# Patient Record
Sex: Female | Born: 1935 | Race: White | Hispanic: No | Marital: Married | Smoking: Former smoker
Health system: Southern US, Community
[De-identification: ages and names within clinical notes are randomized; demographics above are authoritative.]

## PROBLEM LIST (undated history)

## (undated) DIAGNOSIS — I890 Lymphedema, not elsewhere classified: Secondary | ICD-10-CM

## (undated) DIAGNOSIS — Z8719 Personal history of other diseases of the digestive system: Secondary | ICD-10-CM

## (undated) DIAGNOSIS — M1990 Unspecified osteoarthritis, unspecified site: Secondary | ICD-10-CM

## (undated) DIAGNOSIS — J40 Bronchitis, not specified as acute or chronic: Secondary | ICD-10-CM

## (undated) DIAGNOSIS — I10 Essential (primary) hypertension: Secondary | ICD-10-CM

## (undated) DIAGNOSIS — IMO0002 Reserved for concepts with insufficient information to code with codable children: Secondary | ICD-10-CM

## (undated) DIAGNOSIS — Z22322 Carrier or suspected carrier of Methicillin resistant Staphylococcus aureus: Secondary | ICD-10-CM

## (undated) DIAGNOSIS — K219 Gastro-esophageal reflux disease without esophagitis: Secondary | ICD-10-CM

## (undated) DIAGNOSIS — H547 Unspecified visual loss: Secondary | ICD-10-CM

## (undated) DIAGNOSIS — D649 Anemia, unspecified: Secondary | ICD-10-CM

## (undated) DIAGNOSIS — I509 Heart failure, unspecified: Secondary | ICD-10-CM

## (undated) HISTORY — PX: OTHER SURGICAL HISTORY: SHX169

## (undated) HISTORY — PX: KNEE ARTHROSCOPY: SUR90

## (undated) HISTORY — PX: BREAST ENHANCEMENT SURGERY: SHX7

## (undated) HISTORY — DX: Essential (primary) hypertension: I10

---

## 2003-06-18 ENCOUNTER — Other Ambulatory Visit: Payer: Self-pay

## 2003-11-08 ENCOUNTER — Encounter: Admission: RE | Admit: 2003-11-08 | Discharge: 2003-11-08 | Payer: Self-pay | Admitting: Internal Medicine

## 2003-11-19 ENCOUNTER — Encounter: Admission: RE | Admit: 2003-11-19 | Discharge: 2003-11-19 | Payer: Self-pay | Admitting: Internal Medicine

## 2003-12-05 ENCOUNTER — Encounter: Admission: RE | Admit: 2003-12-05 | Discharge: 2003-12-05 | Payer: Self-pay | Admitting: Internal Medicine

## 2004-01-16 ENCOUNTER — Ambulatory Visit: Payer: Self-pay | Admitting: Oncology

## 2004-01-21 ENCOUNTER — Encounter: Payer: Self-pay | Admitting: Internal Medicine

## 2004-02-11 ENCOUNTER — Ambulatory Visit: Payer: Self-pay | Admitting: Oncology

## 2004-02-11 ENCOUNTER — Encounter: Payer: Self-pay | Admitting: Internal Medicine

## 2004-05-06 ENCOUNTER — Ambulatory Visit: Payer: Self-pay | Admitting: Oncology

## 2004-05-13 ENCOUNTER — Ambulatory Visit: Payer: Self-pay | Admitting: Oncology

## 2004-07-21 ENCOUNTER — Ambulatory Visit: Payer: Self-pay | Admitting: Internal Medicine

## 2011-07-19 ENCOUNTER — Ambulatory Visit: Payer: Self-pay | Admitting: Internal Medicine

## 2011-08-01 ENCOUNTER — Other Ambulatory Visit: Payer: Self-pay | Admitting: Orthopedic Surgery

## 2011-08-01 MED ORDER — DEXAMETHASONE SODIUM PHOSPHATE 10 MG/ML IJ SOLN: 10.0000 mg | Freq: Once | INTRAMUSCULAR | Status: DC

## 2011-08-01 MED ORDER — BUPIVACAINE 0.25 % ON-Q PUMP SINGLE CATH 300ML: 300.0000 mL | INJECTION | Status: DC

## 2011-10-01 ENCOUNTER — Encounter (HOSPITAL_COMMUNITY): Payer: Self-pay | Admitting: Pharmacy Technician

## 2011-10-05 ENCOUNTER — Encounter (HOSPITAL_COMMUNITY)
Admission: RE | Admit: 2011-10-05 | Discharge: 2011-10-05 | Disposition: A | Discharge status: Home / Self Care | Payer: Medicare Other | Source: Ambulatory Visit | Attending: Orthopedic Surgery | Admitting: Orthopedic Surgery

## 2011-10-05 ENCOUNTER — Other Ambulatory Visit: Payer: Self-pay | Admitting: Orthopedic Surgery

## 2011-10-05 ENCOUNTER — Ambulatory Visit (HOSPITAL_COMMUNITY)
Admission: RE | Admit: 2011-10-05 | Discharge: 2011-10-05 | Disposition: A | Discharge status: Home / Self Care | Payer: Medicare Other | Source: Ambulatory Visit | Attending: Orthopedic Surgery | Admitting: Orthopedic Surgery

## 2011-10-05 ENCOUNTER — Encounter (HOSPITAL_COMMUNITY): Payer: Self-pay

## 2011-10-05 DIAGNOSIS — M1710 Unilateral primary osteoarthritis, unspecified knee: Secondary | ICD-10-CM | POA: Insufficient documentation

## 2011-10-05 DIAGNOSIS — M795 Residual foreign body in soft tissue: Secondary | ICD-10-CM | POA: Insufficient documentation

## 2011-10-05 DIAGNOSIS — Z01812 Encounter for preprocedural laboratory examination: Secondary | ICD-10-CM | POA: Insufficient documentation

## 2011-10-05 DIAGNOSIS — Z01818 Encounter for other preprocedural examination: Secondary | ICD-10-CM | POA: Insufficient documentation

## 2011-10-05 DIAGNOSIS — K449 Diaphragmatic hernia without obstruction or gangrene: Secondary | ICD-10-CM | POA: Insufficient documentation

## 2011-10-05 HISTORY — DX: Unspecified visual loss: H54.7

## 2011-10-05 HISTORY — DX: Unspecified osteoarthritis, unspecified site: M19.90

## 2011-10-05 HISTORY — DX: Personal history of other diseases of the digestive system: Z87.19

## 2011-10-05 HISTORY — DX: Gastro-esophageal reflux disease without esophagitis: K21.9

## 2011-10-05 HISTORY — DX: Carrier or suspected carrier of methicillin resistant Staphylococcus aureus: Z22.322

## 2011-10-05 HISTORY — DX: Reserved for concepts with insufficient information to code with codable children: IMO0002

## 2011-10-05 HISTORY — DX: Anemia, unspecified: D64.9

## 2011-10-05 LAB — CBC
HCT: 32.4 % — ABNORMAL LOW (ref 36.0–46.0)
Hemoglobin: 10.1 g/dL — ABNORMAL LOW (ref 12.0–15.0)
MCH: 31.2 pg (ref 26.0–34.0)
MCHC: 31.2 g/dL (ref 30.0–36.0)
RBC: 3.24 MIL/uL — ABNORMAL LOW (ref 3.87–5.11)

## 2011-10-05 LAB — URINALYSIS, ROUTINE W REFLEX MICROSCOPIC
Glucose, UA: NEGATIVE mg/dL
Hgb urine dipstick: NEGATIVE
Nitrite: NEGATIVE
Protein, ur: NEGATIVE mg/dL
Specific Gravity, Urine: 1.034 — ABNORMAL HIGH (ref 1.005–1.030)
Urobilinogen, UA: 1 mg/dL (ref 0.0–1.0)
pH: 5.5 (ref 5.0–8.0)

## 2011-10-05 LAB — COMPREHENSIVE METABOLIC PANEL WITH GFR
ALT: 8 U/L (ref 0–35)
AST: 11 U/L (ref 0–37)
Albumin: 3.8 g/dL (ref 3.5–5.2)
Alkaline Phosphatase: 67 U/L (ref 39–117)
BUN: 21 mg/dL (ref 6–23)
CO2: 28 meq/L (ref 19–32)
Calcium: 9.9 mg/dL (ref 8.4–10.5)
Chloride: 103 meq/L (ref 96–112)
Creatinine, Ser: 0.78 mg/dL (ref 0.50–1.10)
GFR calc Af Amer: 88 mL/min — ABNORMAL LOW
GFR calc non Af Amer: 76 mL/min — ABNORMAL LOW
Glucose, Bld: 109 mg/dL — ABNORMAL HIGH (ref 70–99)
Potassium: 4.3 meq/L (ref 3.5–5.1)
Sodium: 139 meq/L (ref 135–145)
Total Bilirubin: 0.5 mg/dL (ref 0.3–1.2)
Total Protein: 7.2 g/dL (ref 6.0–8.3)

## 2011-10-05 LAB — URINE MICROSCOPIC-ADD ON

## 2011-10-05 LAB — APTT: aPTT: 33 s (ref 24–37)

## 2011-10-05 LAB — PROTIME-INR: Prothrombin Time: 12.9 seconds (ref 11.6–15.2)

## 2011-10-05 NOTE — Pre-Procedure Instructions (Signed)
Teach Back Method used for teaching on preop appointment.   

## 2011-10-05 NOTE — Progress Notes (Signed)
Patient has copy of recent labs done 09/29/11 at PCP office in [place].  HGB of 9.7.  Pt has noted wheezing with exertion on preop visit.  EKG and CXR completed at preop visit. Patient reports talking diuretic weekly for edema.

## 2011-10-05 NOTE — Pre-Procedure Instructions (Signed)
10/05/11 patient reports history of 2 epidurals on lower back

## 2011-10-05 NOTE — Progress Notes (Signed)
10/05/11 Faxed and confirmation received to Dr Lequita Halt to note abnormal  CBC and Urinalysis results.

## 2011-10-05 NOTE — Patient Instructions (Addendum)
20 Jaime [name]  10/05/2011   Your procedure is scheduled on:  10/18/11 1115am-1220pm  Report to [facility] at 0845 AM.  Call this number if you have problems the morning of surgery: [phone]   Remember:   Do not eat food:After Midnight.  May have clear liquids:until Midnight . Marland Kitchen  Take these medicines the morning of surgery with A SIP OF WATER:   Do not wear jewelry, make-up or nail polish.  Do not wear lotions, powders, or perfumes.   Do not shave 48 hours prior to surgery.   Do not bring valuables to the hospital.  Contacts, dentures or bridgework may not be worn into surgery.  Leave suitcase in the car. After surgery it may be brought to your room.  For patients admitted to the hospital, checkout time is 11:00 AM the day of discharge.       Special Instructions: CHG Shower Use Special Wash: 1/2 bottle night before surgery and 1/2 bottle morning of surgery. shower chin to toes with CHG.  Wash face and private parts with regular soap.     Please read over the following fact sheets that you were given: MRSA Information, Blood Transfusion Fact Sheet, Incentive Spirometry Fact sheet, coughing and deep breathing exercises, leg exercises

## 2011-10-06 ENCOUNTER — Encounter (HOSPITAL_COMMUNITY): Payer: Self-pay

## 2011-10-06 NOTE — Pre-Procedure Instructions (Signed)
10/06/11 Received fax from Avel Peace stating no action necessary on CBC and Urinalysis results.

## 2011-10-06 NOTE — Pre-Procedure Instructions (Signed)
Office visit note from PCP on chart

## 2011-10-08 NOTE — Pre-Procedure Instructions (Signed)
10/07/11 Patient called and stated had received phone call regarding positive pcr screen.  Voiced understanding for following Mupirocin ointment instructions.

## 2011-10-17 ENCOUNTER — Other Ambulatory Visit: Payer: Self-pay | Admitting: Orthopedic Surgery

## 2011-10-17 NOTE — H&P (Signed)
PATIENT IS BLIND Please announce yourself by name and purpose when entering the room.  Patient also uses a seeing-eye dog for mobility. Please do not pet the dog as they are working.  Jaime [name]  DOB: 06/24/1927 Married / Language: English / Race: White / Female  Date of Admission:  10/18/2011  Chief Complaint:  Right Knee Pain  History of Present Illness The patient is a 78 year old female who comes in for a preoperative History and Physical. The patient is scheduled for a right total knee arthroplasty to be performed by Dr. Frank V. Aluisio, MD at [facility] on 10/18/2011. The patient is a 78 year old female who presents with knee complaints. The patient reports left knee (worse than right) symptoms including: pain, swelling, instability, giving way and stiffness .The patient feels that the symptoms are worsening. The patient has the current diagnosis of knee osteoarthritis. Prior to being seen today the patient was previously evaluated in this clinic 5 year(s) ago. Previous work-up for this problem has included knee x-rays. Current treatment includes application of ice and nonsteroidal anti-inflammatory drugs (ibuprofen). Her right knee has gotten progressively worse. The left knee has a lot of chronic problems. It hurts at all times but she has had significant comorbidities with that left knee. She had falls many years ago with open injury which developed to MRSA and a gram negative infection. It eventually healed after some grafting. She said that both knees, however, are hurting equally. The left one is more chronic but the right one has been more acute. She has had injections. She is at a stage now where she feels she needs to get it fixed because she's concerned about falling. Of note, she is legally blind and walks with the assistance of a seeing eye dog. She is really concerned that the knee is going to give out on her and cause her to fall and hurt other  things. They have been treated conservatively in the past for the above stated problem and despite conservative measures, they continue to have progressive pain and severe functional limitations and dysfunction. They have failed non-operative management including home exercise, medications, and injections. It is felt that they would benefit from undergoing total joint replacement. Risks and benefits of the procedure have been discussed with the patient and they elect to proceed with surgery. There are no active contraindications to surgery such as ongoing infection or rapidly progressive neurological disease.  Please note that the patient had a recent compression fracture back before Easter earlier this year. The pain has been improving over time and she states that she has had no pain this past week. She has been managed and treated by Dr. Miller in Pain Management at [facility].   Problem List/Past Medical Osteoarthrosis NOS, lower leg (715.96). 10/11/2006 Gastroesophageal Reflux Disease Osteoarthritis Hiatal Hernia Measles Mumps  Allergies No Known Drug Allergies   Family History Cancer. father Rheumatoid Arthritis. sister   Social History Living situation. live with spouse Marital status. married Illicit drug use. no Drug/Alcohol Rehab (Previously). no Exercise. Exercises weekly; does other Tobacco use. former smoker; smoke(d) 1 pack(s) per day Tobacco / smoke exposure. no Number of flights of stairs before winded. less than 1 Pain Contract. no Drug/Alcohol Rehab (Currently). no Children. 2 Current work status. retired Alcohol use. current drinker; drinks wine; only occasionally per week Post-Surgical Plans. Plan is to go home but she is open to SNF/rehab if needed. Advance Directives. Healthcare POA   Medication   History Omeprazole (20MG Tablet DR, Oral) Active. Torsemide (20MG Tablet, Oral) Active. (once a week) Potassium Chloride Crys ER  (20MEQ Tablet ER, Oral) Active. Ferrex 28 ( Oral) Active. Ibuprofen (200MG Capsule, Oral) Active. Magnesium Oxide (400MG Capsule, Oral) Active. Vitamin D3 (1000UNIT Capsule, Oral) Active.   Past Surgical History Arthroscopy of Knee. left Tubal Ligation   Review of Systems General:Not Present- Chills, Fever, Night Sweats, Fatigue, Weight Gain, Weight Loss and Memory Loss. Skin:Not Present- Hives, Itching, Rash, Eczema and Lesions. HEENT:Not Present- Tinnitus, Headache, Double Vision, Visual Loss, Hearing Loss and Dentures. Respiratory:Not Present- Shortness of breath with exertion, Shortness of breath at rest, Allergies, Coughing up blood and Chronic Cough. Cardiovascular:Not Present- Chest Pain, Racing/skipping heartbeats, Difficulty Breathing Lying Down, Murmur, Swelling and Palpitations. Gastrointestinal:Not Present- Bloody Stool, Heartburn, Abdominal Pain, Vomiting, Nausea, Constipation, Diarrhea, Difficulty Swallowing, Jaundice and Loss of appetitie. Female Genitourinary:Not Present- Blood in Urine, Urinary frequency, Weak urinary stream, Discharge, Flank Pain, Incontinence, Painful Urination, Urgency, Urinary Retention and Urinating at Night. Musculoskeletal:Not Present- Muscle Weakness, Muscle Pain, Joint Swelling, Joint Pain, Back Pain, Morning Stiffness and Spasms. Neurological:Not Present- Tremor, Dizziness, Blackout spells, Paralysis, Difficulty with balance and Weakness. Psychiatric:Not Present- Insomnia.   Vitals Weight: 183 lb Height: 61.5 in Body Surface Area: 1.9 m Body Mass Index: 34.02 kg/m Pulse: 80 (Regular) Resp.: 14 (Unlabored) BP: 126/78 (Sitting, Left Arm, Standard)    Physical Exam The physical exam findings are as follows: The patient is an 78 year old female.  She is blind and is accompanied by her seeing-eye dog.  General Mental Status - Alert, cooperative and good historian. General Appearance- pleasant. Not in acute  distress. Orientation- Oriented X3. Build & Nutrition- Well nourished and Well developed.   Head and Neck Head- normocephalic, atraumatic . Neck Global Assessment- supple. no bruit auscultated on the right and no bruit auscultated on the left.   Eye Motion- Bilateral- EOMI. The patient is blind.   Chest and Lung Exam Auscultation: Breath sounds:- clear at anterior chest wall and - clear at posterior chest wall. Adventitious sounds:- No Adventitious sounds.   Cardiovascular Auscultation:Rhythm- Regular rate and rhythm. Heart Sounds- S1 WNL and S2 WNL. Murmurs & Other Heart Sounds:Auscultation of the heart reveals - No Murmurs.   Abdomen Palpation/Percussion:Tenderness- Abdomen is non-tender to palpation. Rigidity (guarding)- Abdomen is soft. Auscultation:Auscultation of the abdomen reveals - Bowel sounds normal.   Female Genitourinary Not done, not pertinent to present illness  Musculoskeletal On exam, she's alert and oriented, in no apparent distress. Left knee shows significant skin deformity. She's got a very contracted scar below the knee. This area is visible with healed skin graft. She is very tender throughout the knee. The skin is very thin overlying the anteromedial knee. Her right knee shows no effusion. Range is 5-120 with marked crepitus on ROM, slight varus deformity, tender medial greater than lateral with no instability. Pulses, sensation and motor are intact.  RADIOGRAPHS: AP of both knees and lateral show advanced arthritic changes, tricompartmental in nature in the left knee, medial and patellofemoral on the right. Left knee changes are more advanced.  Assessment & Plan Osteoarthritis Right Knee  Note: Patient is for a Right Total Knee Replacement by Dr. Aluisio.  PCP - Dr. Mark Miller [facility]  Signed electronically by DREW L Prisma Decarlo, PA-C   PATIENT IS BLIND Please announce yourself by name and purpose when  entering the room.  Patient also uses a seeing-eye dog for mobility. Please do not pet the dog as they   are working. 

## 2011-10-18 ENCOUNTER — Inpatient Hospital Stay (HOSPITAL_COMMUNITY)
Admission: RE | Admit: 2011-10-18 | Discharge: 2011-10-23 | DRG: 470 | Disposition: A | Discharge status: Skilled Nursing Facility | Payer: Medicare Other | Source: Ambulatory Visit | Attending: Orthopedic Surgery | Admitting: Orthopedic Surgery

## 2011-10-18 ENCOUNTER — Encounter (HOSPITAL_COMMUNITY): Payer: Self-pay | Admitting: Anesthesiology

## 2011-10-18 ENCOUNTER — Encounter (HOSPITAL_COMMUNITY): Payer: Self-pay | Admitting: Orthopedic Surgery

## 2011-10-18 ENCOUNTER — Ambulatory Visit (HOSPITAL_COMMUNITY): Payer: Medicare Other | Admitting: Anesthesiology

## 2011-10-18 ENCOUNTER — Encounter (HOSPITAL_COMMUNITY)
Admission: RE | Disposition: A | Discharge status: Skilled Nursing Facility | Payer: Self-pay | Source: Ambulatory Visit | Attending: Orthopedic Surgery

## 2011-10-18 ENCOUNTER — Encounter (HOSPITAL_COMMUNITY): Payer: Self-pay | Admitting: *Deleted

## 2011-10-18 DIAGNOSIS — H543 Unqualified visual loss, both eyes: Secondary | ICD-10-CM | POA: Diagnosis present

## 2011-10-18 DIAGNOSIS — M1710 Unilateral primary osteoarthritis, unspecified knee: Principal | ICD-10-CM | POA: Diagnosis present

## 2011-10-18 DIAGNOSIS — Z9289 Personal history of other medical treatment: Secondary | ICD-10-CM

## 2011-10-18 DIAGNOSIS — D62 Acute posthemorrhagic anemia: Secondary | ICD-10-CM | POA: Diagnosis not present

## 2011-10-18 DIAGNOSIS — D649 Anemia, unspecified: Secondary | ICD-10-CM

## 2011-10-18 DIAGNOSIS — E871 Hypo-osmolality and hyponatremia: Secondary | ICD-10-CM | POA: Diagnosis not present

## 2011-10-18 DIAGNOSIS — R0902 Hypoxemia: Secondary | ICD-10-CM | POA: Diagnosis not present

## 2011-10-18 DIAGNOSIS — Z96659 Presence of unspecified artificial knee joint: Secondary | ICD-10-CM

## 2011-10-18 DIAGNOSIS — K449 Diaphragmatic hernia without obstruction or gangrene: Secondary | ICD-10-CM | POA: Diagnosis present

## 2011-10-18 DIAGNOSIS — K219 Gastro-esophageal reflux disease without esophagitis: Secondary | ICD-10-CM | POA: Diagnosis present

## 2011-10-18 HISTORY — PX: TOTAL KNEE ARTHROPLASTY: SHX125

## 2011-10-18 LAB — ABO/RH: ABO/RH(D): A NEG

## 2011-10-18 SURGERY — ARTHROPLASTY, KNEE, TOTAL
Anesthesia: Spinal | Site: Knee | Laterality: Right | Wound class: Clean

## 2011-10-18 MED ORDER — HYDROMORPHONE HCL PF 1 MG/ML IJ SOLN
0.2500 mg | INTRAMUSCULAR | Status: DC | PRN
  Administered 2011-10-18 (×2): 0.5 mg via INTRAVENOUS

## 2011-10-18 MED ORDER — BUPIVACAINE ON-Q PAIN PUMP (FOR ORDER SET NO CHG)
INJECTION | Status: DC
  Filled 2011-10-18: qty 1

## 2011-10-18 MED ORDER — RIVAROXABAN 10 MG PO TABS
10.0000 mg | ORAL_TABLET | Freq: Every day | ORAL | Status: DC
  Administered 2011-10-19 – 2011-10-23 (×5): 10 mg via ORAL
  Filled 2011-10-18 (×7): qty 1

## 2011-10-18 MED ORDER — TRAMADOL HCL 50 MG PO TABS
50.0000 mg | ORAL_TABLET | Freq: Four times a day (QID) | ORAL | Status: DC | PRN
  Administered 2011-10-19 – 2011-10-20 (×2): 100 mg via ORAL
  Administered 2011-10-20: 50 mg via ORAL
  Administered 2011-10-21 – 2011-10-23 (×8): 100 mg via ORAL
  Filled 2011-10-18 (×9): qty 2
  Filled 2011-10-18: qty 1
  Filled 2011-10-18: qty 2

## 2011-10-18 MED ORDER — POLYETHYLENE GLYCOL 3350 17 G PO PACK: 17.0000 g | PACK | Freq: Every day | ORAL | Status: DC | PRN

## 2011-10-18 MED ORDER — FLEET ENEMA 7-19 GM/118ML RE ENEM: 1.0000 | ENEMA | Freq: Once | RECTAL | Status: AC | PRN

## 2011-10-18 MED ORDER — PHENYLEPHRINE HCL 10 MG/ML IJ SOLN
10.0000 mg | INTRAVENOUS | Status: DC | PRN
  Administered 2011-10-18: 10 ug/min via INTRAVENOUS

## 2011-10-18 MED ORDER — DIPHENHYDRAMINE HCL 12.5 MG/5ML PO ELIX: 12.5000 mg | ORAL_SOLUTION | ORAL | Status: DC | PRN

## 2011-10-18 MED ORDER — PROMETHAZINE HCL 25 MG/ML IJ SOLN: 6.2500 mg | INTRAMUSCULAR | Status: DC | PRN

## 2011-10-18 MED ORDER — PHENOL 1.4 % MT LIQD: 1.0000 | OROMUCOSAL | Status: DC | PRN

## 2011-10-18 MED ORDER — CHLORHEXIDINE GLUCONATE 4 % EX LIQD
60.0000 mL | Freq: Once | CUTANEOUS | Status: DC
  Filled 2011-10-18: qty 60

## 2011-10-18 MED ORDER — ACETAMINOPHEN 10 MG/ML IV SOLN
INTRAVENOUS | Status: AC
  Filled 2011-10-18: qty 100

## 2011-10-18 MED ORDER — MORPHINE SULFATE 2 MG/ML IJ SOLN
1.0000 mg | INTRAMUSCULAR | Status: DC | PRN
  Administered 2011-10-18 – 2011-10-19 (×6): 2 mg via INTRAVENOUS
  Filled 2011-10-18 (×6): qty 1

## 2011-10-18 MED ORDER — ACETAMINOPHEN 650 MG RE SUPP: 650.0000 mg | Freq: Four times a day (QID) | RECTAL | Status: DC | PRN

## 2011-10-18 MED ORDER — METHOCARBAMOL 100 MG/ML IJ SOLN
500.0000 mg | Freq: Four times a day (QID) | INTRAVENOUS | Status: DC | PRN
  Administered 2011-10-18 – 2011-10-19 (×2): 500 mg via INTRAVENOUS
  Filled 2011-10-18 (×2): qty 5

## 2011-10-18 MED ORDER — BISACODYL 10 MG RE SUPP
10.0000 mg | Freq: Every day | RECTAL | Status: DC | PRN
  Administered 2011-10-22: 10 mg via RECTAL
  Filled 2011-10-18: qty 1

## 2011-10-18 MED ORDER — SODIUM CHLORIDE 0.9 % IR SOLN
Status: DC | PRN
  Administered 2011-10-18: 1000 mL

## 2011-10-18 MED ORDER — OXYCODONE HCL 5 MG PO TABS
5.0000 mg | ORAL_TABLET | ORAL | Status: DC | PRN
Start: 2011-10-18 — End: 2011-10-19
  Administered 2011-10-18: 5 mg via ORAL
  Administered 2011-10-19 (×2): 10 mg via ORAL
  Filled 2011-10-18 (×2): qty 2
  Filled 2011-10-18: qty 1

## 2011-10-18 MED ORDER — METHOCARBAMOL 500 MG PO TABS
500.0000 mg | ORAL_TABLET | Freq: Four times a day (QID) | ORAL | Status: DC | PRN
  Administered 2011-10-18 – 2011-10-23 (×9): 500 mg via ORAL
  Filled 2011-10-18 (×9): qty 1

## 2011-10-18 MED ORDER — METOCLOPRAMIDE HCL 5 MG/ML IJ SOLN: 5.0000 mg | Freq: Three times a day (TID) | INTRAMUSCULAR | Status: DC | PRN

## 2011-10-18 MED ORDER — DOCUSATE SODIUM 100 MG PO CAPS
100.0000 mg | ORAL_CAPSULE | Freq: Two times a day (BID) | ORAL | Status: DC
  Administered 2011-10-18 – 2011-10-23 (×9): 100 mg via ORAL

## 2011-10-18 MED ORDER — 0.9 % SODIUM CHLORIDE (POUR BTL) OPTIME
TOPICAL | Status: DC | PRN
  Administered 2011-10-18: 1000 mL

## 2011-10-18 MED ORDER — MENTHOL 3 MG MT LOZG
1.0000 | LOZENGE | OROMUCOSAL | Status: DC | PRN
  Administered 2011-10-21: 3 mg via ORAL
  Filled 2011-10-18: qty 9

## 2011-10-18 MED ORDER — FENTANYL 12 MCG/HR TD PT72
12.5000 ug | MEDICATED_PATCH | TRANSDERMAL | Status: DC
  Administered 2011-10-18 – 2011-10-21 (×2): 12.5 ug via TRANSDERMAL
  Filled 2011-10-18 (×2): qty 1

## 2011-10-18 MED ORDER — METOCLOPRAMIDE HCL 10 MG PO TABS: 5.0000 mg | ORAL_TABLET | Freq: Three times a day (TID) | ORAL | Status: DC | PRN

## 2011-10-18 MED ORDER — ONDANSETRON HCL 4 MG PO TABS: 4.0000 mg | ORAL_TABLET | Freq: Four times a day (QID) | ORAL | Status: DC | PRN

## 2011-10-18 MED ORDER — LACTATED RINGERS IV SOLN
INTRAVENOUS | Status: DC | PRN
  Administered 2011-10-18 (×3): via INTRAVENOUS

## 2011-10-18 MED ORDER — HYDROMORPHONE HCL PF 1 MG/ML IJ SOLN
0.2500 mg | INTRAMUSCULAR | Status: DC | PRN
  Administered 2011-10-18 (×4): 0.5 mg via INTRAVENOUS

## 2011-10-18 MED ORDER — ONDANSETRON HCL 4 MG/2ML IJ SOLN: 4.0000 mg | Freq: Four times a day (QID) | INTRAMUSCULAR | Status: DC | PRN

## 2011-10-18 MED ORDER — PROPOFOL 10 MG/ML IV EMUL
INTRAVENOUS | Status: DC | PRN
  Administered 2011-10-18: 50 ug/kg/min via INTRAVENOUS

## 2011-10-18 MED ORDER — SODIUM CHLORIDE 0.9 % IV SOLN: INTRAVENOUS | Status: DC

## 2011-10-18 MED ORDER — FENTANYL CITRATE 0.05 MG/ML IJ SOLN
INTRAMUSCULAR | Status: DC | PRN
  Administered 2011-10-18: 25 ug via INTRAVENOUS

## 2011-10-18 MED ORDER — BUPIVACAINE 0.25 % ON-Q PUMP SINGLE CATH 300ML
INJECTION | Status: DC | PRN
  Administered 2011-10-18: 300 mL

## 2011-10-18 MED ORDER — ONDANSETRON HCL 4 MG/2ML IJ SOLN
INTRAMUSCULAR | Status: DC | PRN
  Administered 2011-10-18: 4 mg via INTRAVENOUS

## 2011-10-18 MED ORDER — CEFAZOLIN SODIUM-DEXTROSE 2-3 GM-% IV SOLR
2.0000 g | INTRAVENOUS | Status: AC
  Administered 2011-10-18: 2 g via INTRAVENOUS

## 2011-10-18 MED ORDER — PANTOPRAZOLE SODIUM 40 MG PO TBEC
40.0000 mg | DELAYED_RELEASE_TABLET | Freq: Every day | ORAL | Status: DC
  Filled 2011-10-18: qty 1

## 2011-10-18 MED ORDER — ACETAMINOPHEN 325 MG PO TABS
650.0000 mg | ORAL_TABLET | Freq: Four times a day (QID) | ORAL | Status: DC | PRN
  Administered 2011-10-21 – 2011-10-23 (×3): 650 mg via ORAL
  Filled 2011-10-18 (×3): qty 2

## 2011-10-18 MED ORDER — TORSEMIDE 20 MG PO TABS
20.0000 mg | ORAL_TABLET | ORAL | Status: DC
  Administered 2011-10-18: 20 mg via ORAL
  Filled 2011-10-18: qty 1

## 2011-10-18 MED ORDER — ACETAMINOPHEN 10 MG/ML IV SOLN
1000.0000 mg | Freq: Once | INTRAVENOUS | Status: AC
  Administered 2011-10-18: 1000 mg via INTRAVENOUS

## 2011-10-18 MED ORDER — CEFAZOLIN SODIUM 1-5 GM-% IV SOLN
1.0000 g | Freq: Four times a day (QID) | INTRAVENOUS | Status: AC
  Administered 2011-10-18 (×2): 1 g via INTRAVENOUS
  Filled 2011-10-18 (×2): qty 50

## 2011-10-18 MED ORDER — HYDROMORPHONE HCL PF 1 MG/ML IJ SOLN
INTRAMUSCULAR | Status: AC
  Filled 2011-10-18: qty 2

## 2011-10-18 MED ORDER — HYDROMORPHONE HCL PF 1 MG/ML IJ SOLN
INTRAMUSCULAR | Status: AC
  Filled 2011-10-18: qty 1

## 2011-10-18 MED ORDER — BUPIVACAINE IN DEXTROSE 0.75-8.25 % IT SOLN
INTRATHECAL | Status: DC | PRN
  Administered 2011-10-18: 1.8 mL via INTRATHECAL

## 2011-10-18 MED ORDER — LACTATED RINGERS IV SOLN: INTRAVENOUS | Status: DC

## 2011-10-18 MED ORDER — MIDAZOLAM HCL 5 MG/5ML IJ SOLN
INTRAMUSCULAR | Status: DC | PRN
  Administered 2011-10-18 (×2): 1 mg via INTRAVENOUS

## 2011-10-18 MED ORDER — ACETAMINOPHEN 10 MG/ML IV SOLN
1000.0000 mg | Freq: Four times a day (QID) | INTRAVENOUS | Status: AC
  Administered 2011-10-18 – 2011-10-19 (×4): 1000 mg via INTRAVENOUS
  Filled 2011-10-18 (×5): qty 100

## 2011-10-18 MED ORDER — CEFAZOLIN SODIUM-DEXTROSE 2-3 GM-% IV SOLR
INTRAVENOUS | Status: AC
  Filled 2011-10-18: qty 50

## 2011-10-18 MED ORDER — BUPIVACAINE 0.25 % ON-Q PUMP SINGLE CATH 300ML
INJECTION | Status: AC
  Filled 2011-10-18: qty 300

## 2011-10-18 MED ORDER — SODIUM CHLORIDE 0.9 % IV SOLN
INTRAVENOUS | Status: DC
  Administered 2011-10-19: 08:00:00 via INTRAVENOUS

## 2011-10-18 MED ORDER — PROPOFOL 10 MG/ML IV EMUL
INTRAVENOUS | Status: DC | PRN
  Administered 2011-10-18: 20 mg via INTRAVENOUS

## 2011-10-18 SURGICAL SUPPLY — 53 items
BAG ZIPLOCK 12X15 (MISCELLANEOUS) ×2 IMPLANT
BANDAGE ELASTIC 6 VELCRO ST LF (GAUZE/BANDAGES/DRESSINGS) ×2 IMPLANT
BANDAGE ESMARK 6X9 LF (GAUZE/BANDAGES/DRESSINGS) ×1 IMPLANT
BLADE SAG 18X100X1.27 (BLADE) ×2 IMPLANT
BLADE SAW SGTL 11.0X1.19X90.0M (BLADE) ×2 IMPLANT
BNDG ESMARK 6X9 LF (GAUZE/BANDAGES/DRESSINGS) ×2
BOWL SMART MIX CTS (DISPOSABLE) ×2 IMPLANT
CATH KIT ON-Q SILVERSOAK 5IN (CATHETERS) ×2 IMPLANT
CEMENT HV SMART SET (Cement) ×4 IMPLANT
CLOTH BEACON ORANGE TIMEOUT ST (SAFETY) ×2 IMPLANT
CUFF TOURN SGL QUICK 34 (TOURNIQUET CUFF) ×1
CUFF TRNQT CYL 34X4X40X1 (TOURNIQUET CUFF) ×1 IMPLANT
DRAPE EXTREMITY T 121X128X90 (DRAPE) ×2 IMPLANT
DRAPE POUCH INSTRU U-SHP 10X18 (DRAPES) ×2 IMPLANT
DRAPE U-SHAPE 47X51 STRL (DRAPES) ×2 IMPLANT
DRSG ADAPTIC 3X8 NADH LF (GAUZE/BANDAGES/DRESSINGS) ×2 IMPLANT
DRSG EMULSION OIL 3X16 NADH (GAUZE/BANDAGES/DRESSINGS) ×2 IMPLANT
DRSG PAD ABDOMINAL 8X10 ST (GAUZE/BANDAGES/DRESSINGS) ×2 IMPLANT
DURAPREP 26ML APPLICATOR (WOUND CARE) ×2 IMPLANT
ELECT REM PT RETURN 9FT ADLT (ELECTROSURGICAL) ×2
ELECTRODE REM PT RTRN 9FT ADLT (ELECTROSURGICAL) ×1 IMPLANT
EVACUATOR 1/8 PVC DRAIN (DRAIN) ×2 IMPLANT
FACESHIELD LNG OPTICON STERILE (SAFETY) ×10 IMPLANT
GLOVE BIO SURGEON STRL SZ7.5 (GLOVE) ×2 IMPLANT
GLOVE BIO SURGEON STRL SZ8 (GLOVE) ×2 IMPLANT
GLOVE BIOGEL PI IND STRL 8 (GLOVE) ×2 IMPLANT
GLOVE BIOGEL PI INDICATOR 8 (GLOVE) ×2
GOWN STRL NON-REIN LRG LVL3 (GOWN DISPOSABLE) ×2 IMPLANT
GOWN STRL REIN XL XLG (GOWN DISPOSABLE) ×2 IMPLANT
HANDPIECE INTERPULSE COAX TIP (DISPOSABLE) ×1
IMMOBILIZER KNEE 20 (SOFTGOODS) ×2
IMMOBILIZER KNEE 20 THIGH 36 (SOFTGOODS) ×1 IMPLANT
K-WIRE 2.8 (WIRE) ×4 IMPLANT
KIT BASIN OR (CUSTOM PROCEDURE TRAY) ×2 IMPLANT
MANIFOLD NEPTUNE II (INSTRUMENTS) ×2 IMPLANT
NS IRRIG 1000ML POUR BTL (IV SOLUTION) ×2 IMPLANT
PACK TOTAL JOINT (CUSTOM PROCEDURE TRAY) ×2 IMPLANT
PAD ABD 7.5X8 STRL (GAUZE/BANDAGES/DRESSINGS) ×2 IMPLANT
PADDING CAST COTTON 6X4 STRL (CAST SUPPLIES) ×2 IMPLANT
POSITIONER SURGICAL ARM (MISCELLANEOUS) ×2 IMPLANT
SET HNDPC FAN SPRY TIP SCT (DISPOSABLE) ×1 IMPLANT
SPONGE GAUZE 4X4 12PLY (GAUZE/BANDAGES/DRESSINGS) ×2 IMPLANT
STRIP CLOSURE SKIN 1/2X4 (GAUZE/BANDAGES/DRESSINGS) ×4 IMPLANT
SUCTION FRAZIER 12FR DISP (SUCTIONS) ×2 IMPLANT
SUT MNCRL AB 4-0 PS2 18 (SUTURE) ×2 IMPLANT
SUT PDS AB 1 CT1 27 (SUTURE) ×6 IMPLANT
SUT VIC AB 2-0 CT1 27 (SUTURE) ×3
SUT VIC AB 2-0 CT1 TAPERPNT 27 (SUTURE) ×3 IMPLANT
SUT VLOC 180 0 24IN GS25 (SUTURE) ×2 IMPLANT
TOWEL OR 17X26 10 PK STRL BLUE (TOWEL DISPOSABLE) ×4 IMPLANT
TRAY FOLEY CATH 14FRSI W/METER (CATHETERS) ×2 IMPLANT
WATER STERILE IRR 1500ML POUR (IV SOLUTION) ×2 IMPLANT
WRAP KNEE MAXI GEL POST OP (GAUZE/BANDAGES/DRESSINGS) ×4 IMPLANT

## 2011-10-18 NOTE — Interval H&P Note (Signed)
History and Physical Interval Note:  10/18/2011 11:12 AM  Jaime [name]  has presented today for surgery, with the diagnosis of Osteoarthritis of the Right Knee  The various methods of treatment have been discussed with the patient and family. After consideration of risks, benefits and other options for treatment, the patient has consented to  Procedure(s) (LRB): TOTAL KNEE ARTHROPLASTY (Right) as a surgical intervention .  The patient's history has been reviewed, patient examined, no change in status, stable for surgery.  I have reviewed the patients' chart and labs.  Questions were answered to the patient's satisfaction.     Loanne Drilling

## 2011-10-18 NOTE — Transfer of Care (Signed)
Immediate Anesthesia Transfer of Care Note  Patient: Jaime [name]  Procedure(s) Performed: Procedure(s) (LRB): TOTAL KNEE ARTHROPLASTY (Right)  Patient Location: PACU  Anesthesia Type: MAC and Regional  Level of Consciousness: awake, alert , oriented and patient cooperative  Airway & Oxygen Therapy: Patient Spontanous Breathing and Patient connected to face mask oxygen  Post-op Assessment: Report given to PACU RN and Post -op Vital signs reviewed and stable  Post vital signs: Reviewed and stable  Complications: No apparent anesthesia complications

## 2011-10-18 NOTE — Op Note (Signed)
Pre-operative diagnosis- Osteoarthritis Right knee(s)  Post-operative diagnosis- Osteoarthritis  Right knee(s)  Procedure-   Right Total Knee Arthroplasty  Surgeon- Jaime Rankin. Hebe Merriwether, MD  Assistant- Avel Peace, PA-C   Anesthesia-  Spinal   EBL- * No blood loss amount entered *   Drains Hemovac   Tourniquet time - 48 minutes @ 300 mm Hg  Complications- None  Condition-PACU - hemodynamically stable.   Brief Clinical Note  Jaime [name] is a 78 y.o. year old female with end stage OA of her right knee with progressively worsening pain and dysfunction. She has constant pain, with activity and at rest and significant functional deficits with difficulties even with ADLs. She has had extensive non-op management including analgesics, injections of cortisone, and home exercise program, but remains in significant pain with significant dysfunction.Radiographs show bone on bone arthritis lateral and patellofemoral with large valgus deformity. She presents now for left Total Knee Arthroplasty.   Procedure in detail---       The patient is brought into the operating room and positioned supine on the operating table. After successful administration of Spinal anesthetic, a tourniquet is placed high on the Right thigh(s) and the lower extremity is prepped and draped in the usual sterile fashion. Time out is performed by the operating team and then the Right  lower extremity is wrapped in Esmarch, knee flexed and the tourniquet inflated to 300 mmHg.       A midline incision is made with a ten blade through the subcutaneous tissue to the level of the extensor mechanism. A fresh blade is used to make a lateral parapatellar arthrotomy due to the patients' valgus deformity. Soft tissue over the proximal lateral tibia is subperiosteally elevated to the joint line with a knife to the posterolateral corner but not including the structures of the posterolateral corner. Soft tissue over the proximal medial  tibia is elevated with attention being paid to avoiding the patellar tendon on the tibial tubercle. The patella is everted medially, knee flexed 90 degrees and the ACL and PCL are removed. Findings are bone on bone lateral and patellofemoral with large lateral osteophytes. .       The drill is used to create a starting hole in the distal femur and the canal is thoroughly irrigated with sterile saline to remove the fatty contents. The 5 degree Right  valgus alignment guide is placed into the femoral canal and the distal femoral cutting block is pinned to remove 11  mm off the distal femur. Resection is made with an oscillating saw.      The tibia is subluxed forward and the menisci are removed. The extramedullary alignment guide is placed referencing proximally at the medial aspect of the tibial tubercle and distally along the second metatarsal axis and tibial crest. The block is pinned to remove 2mm off the more deficient lateral side. Resection is made with an oscillating saw. Size 2.5  is the most appropriate size for the tibia and the proximal tibia is prepared with the modular drill and keel punch for that size.      The femoral sizing guide is placed and size 2.5  is most appropriate. Rotation is marked off the epicondylar axis and confirmed by creating a rectangular flexion gap at 90 degrees. The size 2.5  cutting block is pinned in this rotation and the anterior, posterior and chamfer cuts are made with the oscillating saw. The intercondylar block is then placed and that cut is made.  Trial size 2.5  tibial component, trial size 2.5  posterior stabilized femur and a 12.5  mm posterior stabilized rotating platform insert trial is placed. Full extension is achieved with excellent varus/valgus and   anterior/posterior balance throughout full range of motion. The patella is everted and thickness measured to be 22  mm. Free hand resection is taken to 12 mm, a 35 template is placed, lug holes are drilled,  trial patella is placed, and it tracks normally. Osteophytes are removed off the posterior femur with the trial in place. All trials are removed and the cut bone surfaces prepared with pulsatile lavage. Cement is mixed and once ready for implantation, the size 2.5  tibial implant, size 2.5 posterior stabilized femoral component, and the size 35  patella are cemented in place and the patella is held with the clamp. The trial insert is placed and the knee held in full extension. All extruded cement is removed and once the cement is hard the permanent 12.5  mm posterior stabilized rotating platform insert is placed into the tibial tray. It is noted that there is a small fracture in the medial femoral condyle which is stable throughout full range of motion. I placed 2 threaded Steinmann pins from medial to lateral and the condyle remained stable to multidirectional stresses and throughout range of motion.      The wound is copiously irrigated with saline solution and the tourniquet is released for a total  tourniquet time of 48  minutes. Bleeding is identified and controlled with electrocautery. The extensor mechanism is closed with interrupted #1 PDS leaving open a small area from the superior to inferior pole of the patella to serve as a mini lateral release. Flexion against gravity is 140  degrees and the patella tracks normally. Subcutaneous tissue is closed with 2.0 vicryl and subcuticular with running 4.0 Monocryl. The catheter for the Marcaine pain pump is placed and the pump is initiated. The incision is cleaned and dried and steri-strips and a bulky sterile dressing are applied. The limb is placed into a knee immobilizer and the patient is awakened and transported to recovery in stable condition.      Please note that a surgical assistant was a medical necessity for this procedure in order to perform it in a safe and expeditious manner. Surgical assistant was necessary to retract the ligaments and vital  neurovascular structures to prevent injury to them and also necessary for proper positioning of the limb to allow for anatomic placement of the prosthesis.    Jaime Rankin Shade Rivenbark, MD    10/18/2011, 12:48 PM

## 2011-10-18 NOTE — Preoperative (Signed)
Beta Blockers   Reason not to administer Beta Blockers:Not Applicable 

## 2011-10-18 NOTE — Anesthesia Postprocedure Evaluation (Signed)
Anesthesia Post Note  Patient: Jaime [name]  Procedure(s) Performed: Procedure(s) (LRB): TOTAL KNEE ARTHROPLASTY (Right)  Anesthesia type: Spinal  Patient location: PACU  Post pain: Pain level controlled  Post assessment: Post-op Vital signs reviewed  Last Vitals:  Filed Vitals:   10/18/11 1430  BP: 153/83  Pulse: 90  Temp:   Resp: 18    Post vital signs: Reviewed  Level of consciousness: sedated  Complications: No apparent anesthesia complications

## 2011-10-18 NOTE — Anesthesia Preprocedure Evaluation (Addendum)
Anesthesia Evaluation  Patient identified by MRN, date of birth, ID band Patient awake    Reviewed: Allergy & Precautions, H&P , NPO status , Patient's Chart, lab work & pertinent test results  Airway Mallampati: II TM Distance: <3 FB Neck ROM: Full    Dental  (+) Teeth Intact and Dental Advisory Given   Pulmonary neg pulmonary ROS,  breath sounds clear to auscultation  Pulmonary exam normal       Cardiovascular negative cardio ROS  Rhythm:Regular Rate:Normal     Neuro/Psych Patient is blind; hx compression fracture lower back negative neurological ROS  negative psych ROS   GI/Hepatic Neg liver ROS, hiatal hernia, GERD-  Medicated and Controlled,  Endo/Other  negative endocrine ROS  Renal/GU negative Renal ROS  negative genitourinary   Musculoskeletal negative musculoskeletal ROS (+)   Abdominal   Peds  Hematology negative hematology ROS (+)   Anesthesia Other Findings   Reproductive/Obstetrics negative OB ROS                           Anesthesia Physical Anesthesia Plan  ASA: II  Anesthesia Plan: Spinal   Post-op Pain Management:    Induction:   Airway Management Planned: Simple Face Mask  Additional Equipment:   Intra-op Plan:   Post-operative Plan:   Informed Consent: I have reviewed the patients History and Physical, chart, labs and discussed the procedure including the risks, benefits and alternatives for the proposed anesthesia with the patient or authorized representative who has indicated his/her understanding and acceptance.   Dental advisory given  Plan Discussed with: CRNA  Anesthesia Plan Comments:         Anesthesia Quick Evaluation

## 2011-10-18 NOTE — H&P (View-Only) (Signed)
PATIENT IS BLIND Please announce yourself by name and purpose when entering the room.  Patient also uses a seeing-eye dog for mobility. Please do not pet the dog as they are working.  Jaime [name]  DOB: 12/18/1927 Married / Language: English / Race: White / Female  Date of Admission:  10/18/2011  Chief Complaint:  Right Knee Pain  History of Present Illness The patient is a 78 year old female who comes in for a preoperative History and Physical. The patient is scheduled for a right total knee arthroplasty to be performed by Dr. Gus Rankin. Aluisio, MD at [facility] on 10/18/2011. The patient is a 78 year old female who presents with knee complaints. The patient reports left knee (worse than right) symptoms including: pain, swelling, instability, giving way and stiffness .The patient feels that the symptoms are worsening. The patient has the current diagnosis of knee osteoarthritis. Prior to being seen today the patient was previously evaluated in this clinic 5 year(s) ago. Previous work-up for this problem has included knee x-rays. Current treatment includes application of ice and nonsteroidal anti-inflammatory drugs (ibuprofen). Her right knee has gotten progressively worse. The left knee has a lot of chronic problems. It hurts at all times but she has had significant comorbidities with that left knee. She had falls many years ago with open injury which developed to MRSA and a gram negative infection. It eventually healed after some grafting. She said that both knees, however, are hurting equally. The left one is more chronic but the right one has been more acute. She has had injections. She is at a stage now where she feels she needs to get it fixed because she's concerned about falling. Of note, she is legally blind and walks with the assistance of a seeing eye dog. She is really concerned that the knee is going to give out on her and cause her to fall and hurt other  things. They have been treated conservatively in the past for the above stated problem and despite conservative measures, they continue to have progressive pain and severe functional limitations and dysfunction. They have failed non-operative management including home exercise, medications, and injections. It is felt that they would benefit from undergoing total joint replacement. Risks and benefits of the procedure have been discussed with the patient and they elect to proceed with surgery. There are no active contraindications to surgery such as ongoing infection or rapidly progressive neurological disease.  Please note that the patient had a recent compression fracture back before Easter earlier this year. The pain has been improving over time and she states that she has had no pain this past week. She has been managed and treated by Dr. Hyacinth Meeker in Pain Management at [facility].   Problem List/Past Medical Osteoarthrosis NOS, lower leg (715.96). 10/11/2006 Gastroesophageal Reflux Disease Osteoarthritis Hiatal Hernia Measles Mumps  Allergies No Known Drug Allergies   Family History Cancer. father Rheumatoid Arthritis. sister   Social History Living situation. live with spouse Marital status. married Illicit drug use. no Drug/Alcohol Rehab (Previously). no Exercise. Exercises weekly; does other Tobacco use. former smoker; smoke(d) 1 pack(s) per day Tobacco / smoke exposure. no Number of flights of stairs before winded. less than 1 Pain Contract. no Drug/Alcohol Rehab (Currently). no Children. 2 Current work status. retired Alcohol use. current drinker; drinks wine; only occasionally per week Post-Surgical Plans. Plan is to go home but she is open to SNF/rehab if needed. Advance Directives. Healthcare POA   Medication  History Omeprazole (20MG  Tablet DR, Oral) Active. Torsemide (20MG  Tablet, Oral) Active. (once a week) Potassium Chloride Crys ER  ( Tablet ER, Oral) Active. Ferrex 28 ( Oral) Active. Ibuprofen (200MG  Capsule, Oral) Active. Magnesium Oxide (400MG  Capsule, Oral) Active. Vitamin D3 (1000UNIT Capsule, Oral) Active.   Past Surgical History Arthroscopy of Knee. left Tubal Ligation   Review of Systems General:Not Present- Chills, Fever, Night Sweats, Fatigue, Weight Gain, Weight Loss and Memory Loss. Skin:Not Present- Hives, Itching, Rash, Eczema and Lesions. HEENT:Not Present- Tinnitus, Headache, Double Vision, Visual Loss, Hearing Loss and Dentures. Respiratory:Not Present- Shortness of breath with exertion, Shortness of breath at rest, Allergies, Coughing up blood and Chronic Cough. Cardiovascular:Not Present- Chest Pain, Racing/skipping heartbeats, Difficulty Breathing Lying Down, Murmur, Swelling and Palpitations. Gastrointestinal:Not Present- Bloody Stool, Heartburn, Abdominal Pain, Vomiting, Nausea, Constipation, Diarrhea, Difficulty Swallowing, Jaundice and Loss of appetitie. Female Genitourinary:Not Present- Blood in Urine, Urinary frequency, Weak urinary stream, Discharge, Flank Pain, Incontinence, Painful Urination, Urgency, Urinary Retention and Urinating at Night. Musculoskeletal:Not Present- Muscle Weakness, Muscle Pain, Joint Swelling, Joint Pain, Back Pain, Morning Stiffness and Spasms. Neurological:Not Present- Tremor, Dizziness, Blackout spells, Paralysis, Difficulty with balance and Weakness. Psychiatric:Not Present- Insomnia.   Vitals Weight: 183 lb Height: 61.5 in Body Surface Area: 1.9 m Body Mass Index: 34.02 kg/m Pulse: 80 (Regular) Resp.: 14 (Unlabored) BP: 126/78 (Sitting, Left Arm, Standard)    Physical Exam The physical exam findings are as follows: The patient is an 78 year old female.  She is blind and is accompanied by her seeing-eye dog.  General Mental Status - Alert, cooperative and good historian. General Appearance- pleasant. Not in acute  distress. Orientation- Oriented X3. Build & Nutrition- Well nourished and Well developed.   Head and Neck Head- normocephalic, atraumatic . Neck Global Assessment- supple. no bruit auscultated on the right and no bruit auscultated on the left.   Eye Motion- Bilateral- EOMI. The patient is blind.   Chest and Lung Exam Auscultation: Breath sounds:- clear at anterior chest wall and - clear at posterior chest wall. Adventitious sounds:- No Adventitious sounds.   Cardiovascular Auscultation:Rhythm- Regular rate and rhythm. Heart Sounds- S1 WNL and S2 WNL. Murmurs & Other Heart Sounds:Auscultation of the heart reveals - No Murmurs.   Abdomen Palpation/Percussion:Tenderness- Abdomen is non-tender to palpation. Rigidity (guarding)- Abdomen is soft. Auscultation:Auscultation of the abdomen reveals - Bowel sounds normal.   Female Genitourinary Not done, not pertinent to present illness  Musculoskeletal On exam, she's alert and oriented, in no apparent distress. Left knee shows significant skin deformity. She's got a very contracted scar below the knee. This area is visible with healed skin graft. She is very tender throughout the knee. The skin is very thin overlying the anteromedial knee. Her right knee shows no effusion. Range is 5-120 with marked crepitus on ROM, slight varus deformity, tender medial greater than lateral with no instability. Pulses, sensation and motor are intact.  RADIOGRAPHS: AP of both knees and lateral show advanced arthritic changes, tricompartmental in nature in the left knee, medial and patellofemoral on the right. Left knee changes are more advanced.  Assessment & Plan Osteoarthritis Right Knee  Note: Patient is for a Right Total Knee Replacement by Dr. Lequita Halt.  PCP - Dr. [facility]  Signed electronically by Alanson Aly Kaya Klausing, PA-C   PATIENT IS BLIND Please announce yourself by name and purpose when  entering the room.  Patient also uses a seeing-eye dog for mobility. Please do not pet the dog as they  are working.

## 2011-10-18 NOTE — Progress Notes (Signed)
Utilization review completed.  

## 2011-10-18 NOTE — Anesthesia Procedure Notes (Signed)
Spinal  Patient location during procedure: OR Start time: 10/18/2011 11:20 AM End time: 10/18/2011 11:25 AM Staffing Anesthesiologist: Lucille Passy F Performed by: anesthesiologist  Preanesthetic Checklist Completed: patient identified, site marked, surgical consent, pre-op evaluation, timeout performed, IV checked, risks and benefits discussed and monitors and equipment checked Spinal Block Patient position: sitting Prep: Betadine Patient monitoring: heart rate, continuous pulse ox and blood pressure Approach: midline Location: L3-4 Injection technique: single-shot Needle Needle type: Quincke  Needle gauge: 22 G Needle length: 9 cm Additional Notes Expiration date of kit checked and confirmed. Patient tolerated procedure well, without complications.  Negative heme/paresthesia; good CSF return with initial aspiration but difficult to aspirate following final dose Lot [id number] DOE 12/2012

## 2011-10-19 ENCOUNTER — Inpatient Hospital Stay (HOSPITAL_COMMUNITY): Payer: Medicare Other

## 2011-10-19 ENCOUNTER — Encounter (HOSPITAL_COMMUNITY): Payer: Self-pay | Admitting: Radiology

## 2011-10-19 LAB — CBC
HCT: 27.6 % — ABNORMAL LOW (ref 36.0–46.0)
Hemoglobin: 9.1 g/dL — ABNORMAL LOW (ref 12.0–15.0)
MCH: 32.5 pg (ref 26.0–34.0)
MCHC: 33 g/dL (ref 30.0–36.0)
MCV: 98.6 fL (ref 78.0–100.0)
Platelets: 257 10*3/uL (ref 150–400)
RBC: 2.8 MIL/uL — ABNORMAL LOW (ref 3.87–5.11)
RDW: 22.6 % — ABNORMAL HIGH (ref 11.5–15.5)
WBC: 13.9 10*3/uL — ABNORMAL HIGH (ref 4.0–10.5)

## 2011-10-19 LAB — BASIC METABOLIC PANEL
BUN: 11 mg/dL (ref 6–23)
Chloride: 98 mEq/L (ref 96–112)
Creatinine, Ser: 0.65 mg/dL (ref 0.50–1.10)
GFR calc Af Amer: >90 mL/min (ref >90)

## 2011-10-19 MED ORDER — LEVALBUTEROL HCL 0.63 MG/3ML IN NEBU
0.6300 mg | INHALATION_SOLUTION | Freq: Four times a day (QID) | RESPIRATORY_TRACT | Status: DC | PRN
  Administered 2011-10-22: 0.63 mg via RESPIRATORY_TRACT
  Filled 2011-10-19 (×3): qty 3

## 2011-10-19 MED ORDER — HYDROMORPHONE HCL PF 1 MG/ML IJ SOLN
1.0000 mg | INTRAMUSCULAR | Status: DC | PRN
  Administered 2011-10-19: 1 mg via INTRAVENOUS

## 2011-10-19 MED ORDER — NON FORMULARY: 20.0000 mg | Freq: Every day | Status: DC

## 2011-10-19 MED ORDER — OXYCODONE HCL 5 MG PO TABS
2.5000 mg | ORAL_TABLET | ORAL | Status: DC | PRN
  Administered 2011-10-19 – 2011-10-20 (×6): 5 mg via ORAL
  Filled 2011-10-19 (×6): qty 1

## 2011-10-19 MED ORDER — POLYSACCHARIDE IRON COMPLEX 150 MG PO CAPS
150.0000 mg | ORAL_CAPSULE | Freq: Every day | ORAL | Status: DC
  Administered 2011-10-19 – 2011-10-23 (×5): 150 mg via ORAL
  Filled 2011-10-19 (×5): qty 1

## 2011-10-19 MED ORDER — HYDROMORPHONE HCL PF 1 MG/ML IJ SOLN
0.5000 mg | INTRAMUSCULAR | Status: DC | PRN
  Administered 2011-10-19 (×3): 0.5 mg via INTRAVENOUS
  Filled 2011-10-19 (×3): qty 1

## 2011-10-19 MED ORDER — OMEPRAZOLE 20 MG PO CPDR
20.0000 mg | DELAYED_RELEASE_CAPSULE | Freq: Every day | ORAL | Status: DC
  Administered 2011-10-19 – 2011-10-22 (×4): 20 mg via ORAL
  Filled 2011-10-19 (×5): qty 1

## 2011-10-19 MED ORDER — HYDROMORPHONE HCL PF 1 MG/ML IJ SOLN
INTRAMUSCULAR | Status: AC
  Filled 2011-10-19: qty 1

## 2011-10-19 NOTE — Progress Notes (Signed)
Clinical Social Work Department BRIEF PSYCHOSOCIAL ASSESSMENT 10/19/2011  Patient:  MELODY, CIRRINCIONE     Account Number:  [id number]     Admit date:  10/18/2011  Clinical Social Worker:  Candie Chroman  Date/Time:  10/19/2011 11:09 AM  Referred by:  Physician  Date Referred:  10/19/2011 Referred for  SNF Placement   Other Referral:   Interview type:  Patient Other interview type:    PSYCHOSOCIAL DATA Living Status:  HUSBAND Admitted from facility:   Level of care:   Primary support name:  Reggie Pile Primary support relationship to patient:  SPOUSE Degree of support available:   supportive    CURRENT CONCERNS Current Concerns  Post-Acute Placement   Other Concerns:    SOCIAL WORK ASSESSMENT / PLAN Pt is an 78 yr old female living at home with spouse prior to hospitalization.CSW met with pt/spouse to assist with d/c planning. Pt would like to return home but will accept ST SNF placement if needed. SNF search has been initiated and bed offers will be provided as received. CSW will continue to follow to assist with d/c planning.   Assessment/plan status:  Psychosocial Support/Ongoing Assessment of Needs Other assessment/ plan:   Home with [facility] services.   Information/referral to community resources:   SNF list provided    PATIENT'S/FAMILY'S RESPONSE TO PLAN OF CARE: Pt/spouse would prefer d/c home but are willing to accept ST SNF if recommended by MD/PT.     Cori Razor LCSW [phone]

## 2011-10-19 NOTE — Evaluation (Signed)
Physical Therapy Evaluation Patient Details Name: Jaime [name] MRN: [id number] DOB: 1927/11/12 Today's Date: 10/19/2011 Time: 1130-1210 PT Time Calculation (min): 40 min  PT Assessment / Plan / Recommendation Clinical Impression  78 yo female s/p R TKA. Mobility significantly limited by pain. Requires significant +2 assist for all mobility. Unable to ambulate or take pivotal steps on eval. Recommend ST rehab at SNF.     PT Assessment  Patient needs continued PT services    Follow Up Recommendations  Skilled nursing facility    Barriers to Discharge Decreased caregiver support      Equipment Recommendations  Defer to next venue    Recommendations for Other Services OT consult   Frequency 7X/week    Precautions / Restrictions Precautions Precautions: Fall;Knee Required Braces or Orthoses: Knee Immobilizer - Right Knee Immobilizer - Right: Discontinue once straight leg raise with < 10 degree lag Restrictions Weight Bearing Restrictions: No RLE Weight Bearing: Weight bearing as tolerated   Pertinent Vitals/Pain       Mobility  Bed Mobility Bed Mobility: Supine to Sit Supine to Sit: 1: +2 Total assist;HOB elevated;With rails Supine to Sit: Patient Percentage: 20% Details for Bed Mobility Assistance: VCS safety, technique, direction, hand placement. Assist for bil LEs off bed and trunk to upright. Increased time.  Transfers Transfers: Sit to Stand;Stand to Sit;Squat Pivot Transfers Sit to Stand: 1: +2 Total assist;From bed;From elevated surface;With upper extremity assist Sit to Stand: Patient Percentage: 30% Stand to Sit: 1: +2 Total assist;To bed;With upper extremity assist Stand to Sit: Patient Percentage: 30% Squat Pivot Transfers: 1: +2 Total assist Squat Pivot Transfers: Patient Percentage: 30% Details for Transfer Assistance: Attempted x 2. VCs safety, technique, hand placement. Pt able to stand fully with significant assist, but unable to weightshift or  move R LE forwards/backwards. Squat pivot, bed>recliner towards L side. Ambulation/Gait Ambulation/Gait Assistance: Not tested (comment)    Exercises     PT Diagnosis: Difficulty walking;Acute pain  PT Problem List: Decreased strength;Decreased range of motion;Decreased activity tolerance;Decreased mobility;Decreased balance;Decreased knowledge of use of DME;Decreased knowledge of precautions;Pain;Obesity PT Treatment Interventions: DME instruction;Gait training;Functional mobility training;Therapeutic activities;Therapeutic exercise;Balance training;Patient/family education   PT Goals Acute Rehab PT Goals PT Goal Formulation: With patient Time For Goal Achievement: 10/26/11 Potential to Achieve Goals: Good Pt will go Supine/Side to Sit: with mod assist PT Goal: Supine/Side to Sit - Progress: Goal set today Pt will go Sit to Supine/Side: with mod assist PT Goal: Sit to Supine/Side - Progress: Goal set today Pt will go Sit to Stand: with mod assist PT Goal: Sit to Stand - Progress: Goal set today Pt will Transfer Bed to Chair/Chair to Bed: with mod assist PT Transfer Goal: Bed to Chair/Chair to Bed - Progress: Goal set today Pt will Ambulate: 16 - 50 feet;with mod assist;with rolling walker PT Goal: Ambulate - Progress: Goal set today  Visit Information  Last PT Received On: 10/19/11 Assistance Needed: +2    Subjective Data  Subjective: "What's wrong with my leg....it hurst so bad" Patient Stated Goal: Less pain.    Prior Functioning  Home Living Lives With: Spouse Available Help at Discharge: Family Type of Home: House Home Access: Ramped entrance Home Layout: One level Bathroom Shower/Tub: Naval architect Equipment: Straight cane;Built-in shower seat Prior Function Level of Independence: Independent with assistive device(s);Needs assistance Needs Assistance: Meal Prep;Light Housekeeping Able to Take Stairs?: No Driving: No Communication Communication: No  difficulties    Cognition  Overall Cognitive Status: Appears within  functional limits for tasks assessed/performed Arousal/Alertness: Awake/alert Orientation Level: Disoriented to;Time Behavior During Session: Restless Cognition - Other Comments: Pt is legally bllind, so requires verbal cues for direction, instruction. Pt restless due to severe pain R LE after activity    Extremity/Trunk Assessment Right Lower Extremity Assessment RLE ROM/Strength/Tone: Unable to fully assess;Due to pain;Deficits RLE ROM/Strength/Tone Deficits: Edema R ankle noted. SLR 2-/5. Unable to assess knee ROM due to pain.  RLE Sensation: WFL - Light Touch (very tender to touch, movement) Left Lower Extremity Assessment LLE ROM/Strength/Tone: Deficits LLE ROM/Strength/Tone Deficits: Strength at least 3/5 with functional activty LLE Sensation: WFL - Light Touch   Balance    End of Session PT - End of Session Equipment Utilized During Treatment: Gait belt Activity Tolerance: Patient limited by pain Patient left: in chair;with call bell/phone within reach  GP     [name] [place] 10/19/2011, 1:20 PM [phone]

## 2011-10-19 NOTE — Progress Notes (Signed)
Clinical Social Work Department CLINICAL SOCIAL WORK PLACEMENT NOTE 10/19/2011  Patient:  TAHTIANA, ROZIER  Account Number:  [id number] Admit date:  10/18/2011  Clinical Social Worker:  Cori Razor, LCSW  Date/time:  10/19/2011 11:17 AM  Clinical Social Work is seeking post-discharge placement for this patient at the following level of care:   SKILLED NURSING   (*CSW will update this form in Epic as items are completed)   10/19/2011  Patient/family provided with [facility] Department of Clinical Social Work's list of facilities offering this level of care within the geographic area requested by the patient (or if unable, by the patient's family).  10/19/2011  Patient/family informed of their freedom to choose among providers that offer the needed level of care, that participate in Medicare, Medicaid or managed care program needed by the patient, have an available bed and are willing to accept the patient.    Patient/family informed of [facility]' ownership interest in [facility], as well as of the fact that they are under no obligation to receive care at this facility.  PASARR submitted to EDS on 10/19/2011 PASARR number received from EDS on 10/19/2011  FL2 transmitted to all facilities in geographic area requested by pt/family on  10/19/2011 FL2 transmitted to all facilities within larger geographic area on   Patient informed that his/her managed care company has contracts with or will negotiate with  certain facilities, including the following:     Patient/family informed of bed offers received:   Patient chooses bed at  Physician recommends and patient chooses bed at    Patient to be transferred to  on   Patient to be transferred to facility by   The following physician request were entered in Epic:   Additional Comments:  Cori Razor LCSW [phone]
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
## 2011-10-19 NOTE — Progress Notes (Signed)
Physical Therapy Treatment Patient Details Name: Jaime [name] MRN: [id number] DOB: 04-05-1928 Today's Date: 10/19/2011 Time: 1220-1240 PT Time Calculation (min): 20 min  PT Assessment / Plan / Recommendation Comments on Treatment Session  Pt unable to remain sitting in recliner beyond 10 minutes. Tearfully requesting return to bed. Assisted pt back to bed, repositioned and placed ice pack on knee. Pt unable to tolerate ROM exercises due to pain level. Notified RN of pt's status/pain level. Recommend ST rehab at SNF.     Follow Up Recommendations  Skilled nursing facility    Barriers to Discharge Decreased caregiver support      Equipment Recommendations  Defer to next venue    Recommendations for Other Services OT consult  Frequency 7X/week   Plan Discharge plan remains appropriate    Precautions / Restrictions Precautions Precautions: Fall;Knee Required Braces or Orthoses: Knee Immobilizer - Right Knee Immobilizer - Right: Discontinue once straight leg raise with < 10 degree lag Restrictions Weight Bearing Restrictions: No RLE Weight Bearing: Weight bearing as tolerated   Pertinent Vitals/Pain Pt rates 7/10. Mobility significantly limited by pain.    Mobility  Bed Mobility Bed Mobility: Sit to Supine Sit to Supine: 1: +2 Total assist Sit to Supine: Patient Percentage: 40% Details for Bed Mobility Assistance: Assist for bil LEs onto bed and trunk to supine. VCs safety, technique.  Transfers Transfers: Heritage manager Transfers: 1: +2 Total assist Squat Pivot Transfers: Patient Percentage: 30% Details for Transfer Assistance: VCs safety, technique, hand placement. Squat pivot, recliner>bed, towards L side. Pt unable to tolerate sitting up in recliner. Tearfully requesting back to bed.  Ambulation/Gait Ambulation/Gait Assistance: Not tested (comment)    Exercises     PT Diagnosis: Difficulty walking;Acute pain  PT Problem List: Decreased  strength;Decreased range of motion;Decreased activity tolerance;Decreased mobility;Decreased balance;Decreased knowledge of use of DME;Decreased knowledge of precautions;Pain;Obesity PT Treatment Interventions: DME instruction;Gait training;Functional mobility training;Therapeutic activities;Therapeutic exercise;Balance training;Patient/family education   PT Goals Acute Rehab PT Goals PT Goal Formulation: With patient Time For Goal Achievement: 10/26/11 Potential to Achieve Goals: Good Pt will go Supine/Side to Sit: with mod assist PT Goal: Supine/Side to Sit - Progress: Goal set today Pt will go Sit to Supine/Side: with mod assist PT Goal: Sit to Supine/Side - Progress: Progressing toward goal Pt will go Sit to Stand: with mod assist PT Goal: Sit to Stand - Progress: Goal set today Pt will Transfer Bed to Chair/Chair to Bed: with mod assist PT Transfer Goal: Bed to Chair/Chair to Bed - Progress: Progressing toward goal Pt will Ambulate: 16 - 50 feet;with mod assist;with rolling walker PT Goal: Ambulate - Progress: Goal set today  Visit Information  Last PT Received On: 10/19/11 Assistance Needed: +2    Subjective Data  Subjective: "I can't stay in this chair" Patient Stated Goal: Less pain   Cognition  Overall Cognitive Status: Appears within functional limits for tasks assessed/performed Arousal/Alertness: Awake/alert Orientation Level: Disoriented to;Time Behavior During Session: Restless Cognition - Other Comments: Pt is legally bllind, so requires verbal cues for direction, instruction. Pt restless due to severe pain R LE with activity    Balance     End of Session PT - End of Session Equipment Utilized During Treatment: Gait belt Activity Tolerance: Patient limited by pain Patient left: in bed;with call bell/phone within reach   GP     [name] [facility] 10/19/2011, 1:27 PM [phone]

## 2011-10-19 NOTE — Progress Notes (Signed)
At 04:42 patient stated that the morphine was not longer working to ease the pain and request a different pain med. Morphine discontinued. Dilaudid ordered and given with good result.

## 2011-10-20 ENCOUNTER — Encounter (HOSPITAL_COMMUNITY): Payer: Self-pay | Admitting: Orthopedic Surgery

## 2011-10-20 DIAGNOSIS — E871 Hypo-osmolality and hyponatremia: Secondary | ICD-10-CM

## 2011-10-20 DIAGNOSIS — Z9289 Personal history of other medical treatment: Secondary | ICD-10-CM

## 2011-10-20 DIAGNOSIS — D649 Anemia, unspecified: Secondary | ICD-10-CM

## 2011-10-20 LAB — BASIC METABOLIC PANEL
BUN: 11 mg/dL (ref 6–23)
Creatinine, Ser: 0.73 mg/dL (ref 0.50–1.10)
GFR calc Af Amer: >90 mL/min (ref >90)
GFR calc non Af Amer: 78 mL/min — ABNORMAL LOW (ref >90)
Potassium: 3.7 mEq/L (ref 3.5–5.1)

## 2011-10-20 LAB — CBC
HCT: 25.3 % — ABNORMAL LOW (ref 36.0–46.0)
MCHC: 32.8 g/dL (ref 30.0–36.0)
RDW: 22.2 % — ABNORMAL HIGH (ref 11.5–15.5)

## 2011-10-20 MED ORDER — ACETAMINOPHEN 10 MG/ML IV SOLN
1000.0000 mg | Freq: Once | INTRAVENOUS | Status: AC
  Administered 2011-10-20: 1000 mg via INTRAVENOUS
  Filled 2011-10-20: qty 100

## 2011-10-20 MED ORDER — FUROSEMIDE 10 MG/ML IJ SOLN
10.0000 mg | Freq: Once | INTRAMUSCULAR | Status: AC
  Administered 2011-10-20: 10 mg via INTRAVENOUS
  Filled 2011-10-20: qty 1

## 2011-10-20 NOTE — Progress Notes (Signed)
Physical Therapy Treatment Patient Details Name: Jaime [name] MRN: [id number] DOB: 1927-04-25 Today's Date: 10/20/2011 Time: 1455-1510 PT Time Calculation (min): 15 min  PT Assessment / Plan / Recommendation Comments on Treatment Session  Attempted ROM exercises this pm. Pt unable to tolerate R knee flexion (heel slides)-only 3 reps with less than 10 degrees flexion. Continue to recommend ST rehab at [facility].     Follow Up Recommendations  Skilled nursing facility    Barriers to Discharge        Equipment Recommendations  Defer to next venue    Recommendations for Other Services    Frequency 7X/week   Plan Discharge plan remains appropriate    Precautions / Restrictions Precautions Precautions: Fall;Knee Required Braces or Orthoses: Knee Immobilizer - Right Knee Immobilizer - Right: Discontinue once straight leg raise with < 10 degree lag Restrictions Weight Bearing Restrictions: No RLE Weight Bearing: Weight bearing as tolerated   Pertinent Vitals/Pain 4/10 at rest. Increased pain with ROM exercises.     Mobility  Bed Mobility Bed Mobility: Sit to Supine     Exercises Total Joint Exercises Ankle Circles/Pumps: AROM;Both;10 reps;Supine Quad Sets: AROM;Right;10 reps;Supine Heel Slides: AAROM;Right;Supine (only able to tolerate 3 reps of very little flexion) Hip ABduction/ADduction: AAROM;Right;10 reps;Supine Straight Leg Raises: AAROM;Right;10 reps;Supine   PT Diagnosis:    PT Problem List:   PT Treatment Interventions:     PT Goals Acute Rehab PT Goals PT Goal: Sit to Supine/Side - Progress: Progressing toward goal PT Goal: Sit to Stand - Progress: Progressing toward goal PT Transfer Goal: Bed to Chair/Chair to Bed - Progress: Progressing toward goal  Visit Information  Last PT Received On: 10/20/11 Assistance Needed: +2    Subjective Data  Subjective: "That's all I can do" Patient Stated Goal: Less pain.    Cognition  Overall Cognitive Status:  Appears within functional limits for tasks assessed/performed Arousal/Alertness: Awake/[name] Orientation Level: Disoriented to;Time Behavior During Session: WFL for tasks performed    Balance     End of Session PT - End of Session Equipment Utilized During Treatment: Gait belt Activity Tolerance: Patient limited by pain Patient left: in bed;with call bell/phone within reach;with family/visitor present   GP     Jaime [name] [facility] 10/20/2011, 3:14 PM [phone]
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
## 2011-10-20 NOTE — Progress Notes (Signed)
Physical Therapy Treatment Patient Details Name: Jaime [name] MRN: [id number] DOB: March 27, 1928 Today's Date: 10/20/2011 Time: 4034-7425 PT Time Calculation (min): 12 min  PT Assessment / Plan / Recommendation Comments on Treatment Session  Slowly progressing. Pt requesting to return to bed. Assisted back to bed. Will return later for ROM exercises. Continues to be significantly limited by pain.     Follow Up Recommendations  Skilled nursing facility    Barriers to Discharge        Equipment Recommendations  Defer to next venue    Recommendations for Other Services    Frequency 7X/week   Plan Discharge plan remains appropriate    Precautions / Restrictions Precautions Precautions: Fall;Knee Required Braces or Orthoses: Knee Immobilizer - Right Knee Immobilizer - Right: Discontinue once straight leg raise with < 10 degree lag Restrictions Weight Bearing Restrictions: No RLE Weight Bearing: Weight bearing as tolerated   Pertinent Vitals/Pain     Mobility  Bed Mobility Bed Mobility: Sit to Supine Sit to Supine: 1: +2 Total assist Sit to Supine: Patient Percentage: 30% Details for Bed Mobility Assistance: Assist for bil LEs onto bed and trunk to supine. Increased time.  Transfers Transfers: Sit to Stand;Stand to Sit Sit to Stand: 1: +2 Total assist;With upper extremity assist;From chair/3-in-1 Sit to Stand: Patient Percentage: 40% Stand to Sit: 1: +2 Total assist;To bed Stand to Sit: Patient Percentage: 40% Stand Pivot Transfers: 1: +2 Total assist Stand Pivot Transfers: Patient Percentage: 50% Squat Pivot Transfers: 1: +2 Total assist Squat Pivot Transfers: Patient Percentage: 50% Details for Transfer Assistance: VCs safety, technique, hand placement. Assist to rise, stabilize, control descent, navigate with RW, advance R LE intermittently. Increased time and effort for pt.  Gait Pattern: Step-to pattern;Antalgic;Trunk flexed;Decreased step length - left;Decreased  step length - right;Decreased stride length    Exercises     PT Diagnosis:    PT Problem List:   PT Treatment Interventions:     PT Goals Acute Rehab PT Goals PT Goal: Supine/Side to Sit - Progress: Progressing toward goal PT Goal: Sit to Supine/Side - Progress: Progressing toward goal PT Goal: Sit to Stand - Progress: Progressing toward goal PT Transfer Goal: Bed to Chair/Chair to Bed - Progress: Progressing toward goal PT Goal: Ambulate - Progress: Progressing toward goal  Visit Information  Last PT Received On: 10/20/11 Assistance Needed: +2    Subjective Data  Subjective: "I wanna get back into bed" Patient Stated Goal: Less pain.    Cognition  Overall Cognitive Status: Appears within functional limits for tasks assessed/performed Arousal/Alertness: Awake/alert Orientation Level: Disoriented to;Time Behavior During Session: WFL for tasks performed Cognition - Other Comments: Pt requires increased cues due to blindness but appears cognitively intact.    Balance  Balance Balance Assessed: No  End of Session PT - End of Session Equipment Utilized During Treatment: Gait belt Activity Tolerance: Patient limited by pain;Patient limited by fatigue Patient left: in bed;with call bell/phone within reach CPM Right Knee CPM Right Knee: Off   GP     [name] [facility] 10/20/2011, 11:22 AM [phone]

## 2011-10-20 NOTE — Evaluation (Signed)
Occupational Therapy Evaluation Patient Details Name: Jaime [name] MRN: [id number] DOB: [birth year] Today's Date: 10/20/2011 Time: 7829-5621 OT Time Calculation (min): 30 min  OT Assessment / Plan / Recommendation Clinical Impression  Pt admitted for R femur fx and R TKR with deficits in areas of pain management, mobility, balance, strength and activity tolerance affecting her I with all her basic adls. Pt would benefit from cont OT to increase I with all adls so she can eventually d/c home with husband.    OT Assessment  Patient needs continued OT Services    Follow Up Recommendations  Skilled nursing facility    Barriers to Discharge None Pt will need SNF dc due to pain and decrease in functional mobility  Equipment Recommendations  Defer to next venue    Recommendations for Other Services    Frequency  Min 2X/week    Precautions / Restrictions Precautions Precautions: Fall;Knee Required Braces or Orthoses: Knee Immobilizer - Right Knee Immobilizer - Right: Discontinue once straight leg raise with < 10 degree lag Restrictions Weight Bearing Restrictions: No RLE Weight Bearing: Weight bearing as tolerated   Pertinent Vitals/Pain Pt with 10/10 pain.  Nurse gave pt meds before evaluation.  Pt BP 130/20 and O2 sat 97%.    ADL  Eating/Feeding: Simulated;Set up;Other (comment) (cues for vision) Where Assessed - Eating/Feeding: Chair Grooming: Performed;Wash/dry face;Set up Where Assessed - Grooming: Supported sitting Upper Body Bathing: Simulated;Moderate assistance Where Assessed - Upper Body Bathing: Supported sitting Lower Body Bathing: Simulated;Maximal assistance Where Assessed - Lower Body Bathing: Supported sit to stand Upper Body Dressing: Simulated;Moderate assistance Where Assessed - Upper Body Dressing: Supported sitting Lower Body Dressing: Simulated;+1 Total assistance Where Assessed - Lower Body Dressing: Supported sit to Pharmacist, hospital:  Performed;+2 Total assistance (Pt 40%) Toilet Transfer: Patient Percentage: 40% Statistician Method: Surveyor, minerals: Materials engineer and Hygiene: Performed;+1 Total assistance Where Assessed - Engineer, mining and Hygiene: Sit to stand from 3-in-1 or toilet Equipment Used: Gait belt;Rolling walker Transfers/Ambulation Related to ADLs: Pt required plus 2 assist to get up.  Once up pt required less assist but still needed plus 2.  Pt was able to take steps today to pivot to chair but unable to ambulate. ADL Comments: Pt dependent in LE adls and mod assist with UE adls.  Pt not feeling well secondary to low Hgb and nauseous due to pain meds. Feel pt will attempt more of her adls when she is feeling better.    OT Diagnosis: Generalized weakness;Disturbance of vision;Acute pain  OT Problem List: Decreased strength;Decreased activity tolerance;Impaired balance (sitting and/or standing);Impaired vision/perception;Decreased knowledge of use of DME or AE;Pain OT Treatment Interventions: Self-care/ADL training;DME and/or AE instruction;Therapeutic activities   OT Goals Acute Rehab OT Goals OT Goal Formulation: With patient/family Time For Goal Achievement: 11/03/11 Potential to Achieve Goals: Good ADL Goals Pt Will Perform Grooming: with min assist;Standing at sink ADL Goal: Grooming - Progress: Goal set today Pt Will Perform Lower Body Bathing: with mod assist;Sit to stand from chair ADL Goal: Lower Body Bathing - Progress: Goal set today Pt Will Perform Lower Body Dressing: with mod assist;Sit to stand from chair ADL Goal: Lower Body Dressing - Progress: Goal set today Pt Will Perform Tub/Shower Transfer: Shower transfer;with mod assist;Shower seat with back ADL Goal: Tub/Shower Transfer - Progress: Goal set today Additional ADL Goal #1: Pt will complete all aspects of toileting with mod assist on handicap hight commode  with sink next  to it. ADL Goal: Additional Goal #1 - Progress: Goal set today  Visit Information  Last OT Received On: 10/20/11 Assistance Needed: +2 PT/OT Co-Evaluation/Treatment: Yes    Subjective Data  Subjective: "I just hurt." Patient Stated Goal: to get rid of the pain   Prior Functioning  Home Living Lives With: Spouse Available Help at Discharge: Family Type of Home: House Home Access: Ramped entrance Home Layout: One level Bathroom Shower/Tub: Health visitor: Handicapped height Bathroom Accessibility: Yes How Accessible: Accessible via walker Home Adaptive Equipment: Straight cane;Built-in shower seat Prior Function Level of Independence: Independent with assistive device(s);Needs assistance Needs Assistance: Meal Prep;Light Housekeeping Meal Prep: Minimal Light Housekeeping: Moderate Able to Take Stairs?: No Driving: No Vocation: Retired Musician: No difficulties Dominant Hand: Right    Cognition  Overall Cognitive Status: Appears within functional limits for tasks assessed/performed Arousal/Alertness: Awake/alert Orientation Level: Disoriented to;Time Behavior During Session: WFL for tasks performed Cognition - Other Comments: Pt requires increased cues due to blindness but appears cognitively intact.    Extremity/Trunk Assessment Right Upper Extremity Assessment RUE ROM/Strength/Tone: Within functional levels RUE Sensation: WFL - Light Touch RUE Coordination: WFL - gross/fine motor Left Upper Extremity Assessment LUE ROM/Strength/Tone: Within functional levels LUE Sensation: WFL - Light Touch LUE Coordination: WFL - gross/fine motor   Mobility Bed Mobility Bed Mobility: Supine to Sit Supine to Sit: 1: +2 Total assist Supine to Sit: Patient Percentage: 40% Details for Bed Mobility Assistance: Assist for R LE off bed and trunk to upright. Increased time. Utilized bedpad to assist with scooting,positioning. VCs safety,  technique, hand placement Transfers Transfers: Sit to Stand;Stand to Sit Sit to Stand: 1: +2 Total assist;From elevated surface;With upper extremity assist;From bed Sit to Stand: Patient Percentage: 40% Stand to Sit: 1: +2 Total assist Stand to Sit: Patient Percentage: 40% Details for Transfer Assistance: VCs safety, technique, hand placement. Assist to rise, stabiilze, control descent, navigate with RW, advance R LE. Increased time.    Exercise    Balance Balance Balance Assessed: No  End of Session OT - End of Session Equipment Utilized During Treatment: Gait belt Activity Tolerance: Patient limited by pain Patient left: in chair;with call bell/phone within reach;with family/visitor present;with nursing in room Nurse Communication: Mobility status CPM Right Knee CPM Right Knee: Off  GO     Hope Budds 10/20/2011, 9:59 AM [phone]

## 2011-10-20 NOTE — Progress Notes (Signed)
CSW assisting with d/c planning. Pt has accepted ST Rehab placement at [facility]. Bed is available Thurs/Fri of this week if pt is stable for d/c. CSW will follow to assist with d/c planning to SNF.  Cori Razor LCSW [phone]

## 2011-10-20 NOTE — Progress Notes (Signed)
Physical Therapy Treatment Patient Details Name: Jaime [name] MRN: [id number] DOB: 07/26/1927 Today's Date: 10/20/2011 Time: 7829-5621 PT Time Calculation (min): 31 min  PT Assessment / Plan / Recommendation Comments on Treatment Session  Good improvement with mobility compared to eval. Continues to require +2 assist. Remains significantly limited by pain.     Follow Up Recommendations  Skilled nursing facility    Barriers to Discharge        Equipment Recommendations  Defer to next venue    Recommendations for Other Services    Frequency 7X/week   Plan Discharge plan remains appropriate    Precautions / Restrictions Precautions Required Braces or Orthoses: Knee Immobilizer - Right Knee Immobilizer - Right: Discontinue once straight leg raise with < 10 degree lag Restrictions Weight Bearing Restrictions: No RLE Weight Bearing: Weight bearing as tolerated   Pertinent Vitals/Pain 10/10 pain with activity. C/o dizziness-BP 130/80 at end of session    Mobility  Bed Mobility Bed Mobility: Supine to Sit Supine to Sit: 1: +2 Total assist Supine to Sit: Patient Percentage: 40% Details for Bed Mobility Assistance: Assist for R LE off bed and trunk to upright. Increased time. Utilized bedpad to assist with scooting,positioning. VCs safety, technique, hand placement Transfers Transfers: Sit to Stand;Stand to Sit;Stand Pivot Transfers Sit to Stand: 1: +2 Total assist;From elevated surface;With upper extremity assist;From bed Sit to Stand: Patient Percentage: 40% Stand to Sit: 1: +2 Total assist Stand to Sit: Patient Percentage: 40% Squat Pivot Transfers: 1: +2 Total assist Squat Pivot Transfers: Patient Percentage: 50% Details for Transfer Assistance: VCs safety, technique, hand placement. Assist to rise, stabiilze, control descent, navigate with RW, advance R LE. Increased time.  Ambulation/Gait Ambulation/Gait Assistance: 1: +2 Total assist Ambulation/Gait: Patient  Percentage: 50% Ambulation Distance (Feet): 1 Feet Assistive device: Rolling walker Ambulation/Gait Assistance Details: VCS safety, technique, sequence. Assist to stabilize, navigate with RW, advance R LE. Increased time. Fatigues easily. C/o dizziness.  Gait Pattern: Step-to pattern;Antalgic;Trunk flexed;Decreased step length - left;Decreased step length - right;Decreased stride length    Exercises     PT Diagnosis:    PT Problem List:   PT Treatment Interventions:     PT Goals Acute Rehab PT Goals PT Goal: Supine/Side to Sit - Progress: Progressing toward goal PT Goal: Sit to Stand - Progress: Progressing toward goal PT Transfer Goal: Bed to Chair/Chair to Bed - Progress: Progressing toward goal PT Goal: Ambulate - Progress: Progressing toward goal  Visit Information  Last PT Received On: 10/20/11 Assistance Needed: +2    Subjective Data  Subjective: "I feel dizzy" Patient Stated Goal: Less pain. OOB   Cognition  Overall Cognitive Status: Appears within functional limits for tasks assessed/performed Arousal/Alertness: Awake/alert Orientation Level: Disoriented to;Time Behavior During Session: WFL for tasks performed    Balance     End of Session PT - End of Session Equipment Utilized During Treatment: Gait belt Activity Tolerance: Patient limited by pain;Patient limited by fatigue Patient left: in chair;with call bell/phone within reach;with family/visitor present CPM Right Knee CPM Right Knee: Off   GP     [name] [facility] 10/20/2011, 9:51 AM [phone]

## 2011-10-21 LAB — TYPE AND SCREEN
ABO/RH(D): A NEG
Antibody Screen: NEGATIVE
Unit division: 0

## 2011-10-21 LAB — BASIC METABOLIC PANEL
BUN: 9 mg/dL (ref 6–23)
CO2: 28 mEq/L (ref 19–32)
Calcium: 8.6 mg/dL (ref 8.4–10.5)
GFR calc non Af Amer: 83 mL/min — ABNORMAL LOW (ref >90)
Glucose, Bld: 97 mg/dL (ref 70–99)
Potassium: 3.5 mEq/L (ref 3.5–5.1)
Sodium: 131 mEq/L — ABNORMAL LOW (ref 135–145)

## 2011-10-21 LAB — CBC
Hemoglobin: 9.3 g/dL — ABNORMAL LOW (ref 12.0–15.0)
MCH: 31 pg (ref 26.0–34.0)
MCHC: 33 g/dL (ref 30.0–36.0)
MCV: 94 fL (ref 78.0–100.0)
RBC: 3 MIL/uL — ABNORMAL LOW (ref 3.87–5.11)

## 2011-10-21 NOTE — Progress Notes (Signed)
Physical Therapy Treatment Patient Details Name: Jaime [name] MRN: [id number] DOB: 06/07/1927 Today's Date: 10/21/2011 Time: 4098-1191 PT Time Calculation (min): 10 min  PT Assessment / Plan / Recommendation Comments on Treatment Session  2nd session for ROM exercises. Pt still unable to tolerate heel slides/knee flexion. Very little knee flexion achieved:5 to 10 degrees. Pt requested to defer further knee ROM due to pain. Explained to pt that she needs to start working hard at bending knee to prevent stiffness/contractures. Encouraged pt to use CPM later today.     Follow Up Recommendations  Skilled nursing facility    Barriers to Discharge        Equipment Recommendations  Defer to next venue    Recommendations for Other Services    Frequency 7X/week   Plan Discharge plan remains appropriate    Precautions / Restrictions Precautions Precautions: Knee;Fall Required Braces or Orthoses: Knee Immobilizer - Right Knee Immobilizer - Right: Discontinue once straight leg raise with < 10 degree lag Restrictions Weight Bearing Restrictions: No RLE Weight Bearing: Weight bearing as tolerated   Pertinent Vitals/Pain Mobility/ROM limited by poor tolerance to pain.     Mobility    Exercises Total Joint Exercises Ankle Circles/Pumps: AROM;Both;10 reps;Supine Quad Sets: AROM;Both;10 reps;Supine Heel Slides: AAROM;Right;5 reps;Supine (little to no progress due to pain) Hip ABduction/ADduction: AAROM;Right;10 reps;Supine Straight Leg Raises: AAROM;Right;10 reps;Supine   PT Diagnosis:    PT Problem List:   PT Treatment Interventions:     PT Goals   Visit Information  Last PT Received On: 10/21/11 Assistance Needed: +2    Subjective Data  Subjective: "Can we not do them and say we did them? (exercises) Patient Stated Goal: Less pain.    Cognition  Overall Cognitive Status: Appears within functional limits for tasks assessed/performed Arousal/Alertness:  Awake/alert Behavior During Session: [facility] for tasks performed    Balance     End of Session PT - End of Session Equipment Utilized During Treatment: Gait belt;Right knee immobilizer Activity Tolerance: Patient limited by pain Patient left: in bed;with call bell/phone within reach   GP     [name] [facility] 10/21/2011, 1:44 PM [phone]
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
## 2011-10-21 NOTE — Progress Notes (Signed)
Physical Therapy Treatment Patient Details Name: Jaime [name] MRN: [id number] DOB: [birth year] Today's Date: 10/21/2011 Time: 1100-1130 PT Time Calculation (min): 30 min  PT Assessment / Plan / Recommendation Comments on Treatment Session  Progressing slowly. Able to initiate ambulation this session. Continues to require +2 assist for all mobility. Recommend SNF for rehab.     Follow Up Recommendations  Skilled nursing facility    Barriers to Discharge        Equipment Recommendations  Defer to next venue    Recommendations for Other Services    Frequency 7X/week   Plan Discharge plan remains appropriate    Precautions / Restrictions Precautions Precautions: Fall;Knee Required Braces or Orthoses: Knee Immobilizer - Right Knee Immobilizer - Right: Discontinue once straight leg raise with < 10 degree lag Restrictions Weight Bearing Restrictions: No RLE Weight Bearing: Weight bearing as tolerated   Pertinent Vitals/Pain     Mobility  Bed Mobility Bed Mobility: Supine to Sit Supine to Sit: HOB elevated;With rails;1: +2 Total assist Supine to Sit: Patient Percentage: 30% Details for Bed Mobility Assistance: VCs safety, technique, handplacement. Assist for trunk to upright and bil LEs off bed. Utilized bedpad to assist with scooting, positioning.  Transfers Transfers: Sit to Stand;Stand to Sit Sit to Stand: 1: +2 Total assist;From bed;From elevated surface;With upper extremity assist Sit to Stand: Patient Percentage: 40% Stand to Sit: 1: +2 Total assist;To chair/3-in-1;With armrests;With upper extremity assist Stand to Sit: Patient Percentage: 40% Stand Pivot Transfers: 1: +2 Total assist Stand Pivot Transfers: Patient Percentage: 50% Details for Transfer Assistance: VCs safety, technique, posture, hand placement. Assist to rise, stabilize, navigate with RW, control desent. Assist to advance R LE intermittently. Increased time. Fatigues easily. Sit<>stand x2. Pivot  bed>bsc.  Ambulation/Gait Ambulation/Gait Assistance: 1: +2 Total assist Ambulation/Gait: Patient Percentage: 50% Ambulation Distance (Feet): 3 Feet Assistive device: Rolling walker Ambulation/Gait Assistance Details: VCs safety, technique, sequence. assist to stabilize, navigate with RW, advance R LE intermittently. Increased time. Fatigues easily.  Gait Pattern: Step-to pattern;Wide base of support;Trunk flexed;Decreased stride length;Decreased step length - right;Decreased step length - left;Antalgic    Exercises     PT Diagnosis:    PT Problem List:   PT Treatment Interventions:     PT Goals Acute Rehab PT Goals PT Goal: Supine/Side to Sit - Progress: Progressing toward goal PT Goal: Sit to Stand - Progress: Progressing toward goal PT Transfer Goal: Bed to Chair/Chair to Bed - Progress: Progressing toward goal PT Goal: Ambulate - Progress: Progressing toward goal  Visit Information  Last PT Received On: 10/21/11 Assistance Needed: +2    Subjective Data  Subjective: "I have to use the bathroom. I need pain medicine before therapy" Patient Stated Goal: Less pain.    Cognition  Overall Cognitive Status: Appears within functional limits for tasks assessed/performed Arousal/Alertness: Awake/alert Behavior During Session: [facility] for tasks performed    Balance     End of Session PT - End of Session Equipment Utilized During Treatment: Gait belt;Right knee immobilizer Activity Tolerance: Patient limited by pain;Patient limited by fatigue Patient left: in bed;with call bell/phone within reach;with family/visitor present CPM Right Knee CPM Right Knee: Off   GP     [name] [facility] 10/21/2011, 12:06 PM [phone]

## 2011-10-22 ENCOUNTER — Inpatient Hospital Stay (HOSPITAL_COMMUNITY): Payer: Medicare Other

## 2011-10-22 DIAGNOSIS — R0902 Hypoxemia: Secondary | ICD-10-CM | POA: Diagnosis not present

## 2011-10-22 LAB — BASIC METABOLIC PANEL
BUN: 10 mg/dL (ref 6–23)
Calcium: 8.7 mg/dL (ref 8.4–10.5)
Chloride: 96 mEq/L (ref 96–112)
Creatinine, Ser: 0.55 mg/dL (ref 0.50–1.10)
GFR calc Af Amer: >90 mL/min (ref >90)
GFR calc non Af Amer: 86 mL/min — ABNORMAL LOW (ref >90)

## 2011-10-22 LAB — CBC
HCT: 28.1 % — ABNORMAL LOW (ref 36.0–46.0)
MCH: 31.1 pg (ref 26.0–34.0)
MCHC: 33.1 g/dL (ref 30.0–36.0)
MCV: 94 fL (ref 78.0–100.0)
Platelets: 249 10*3/uL (ref 150–400)
RDW: 21.1 % — ABNORMAL HIGH (ref 11.5–15.5)
WBC: 13.4 10*3/uL — ABNORMAL HIGH (ref 4.0–10.5)

## 2011-10-22 MED ORDER — BISACODYL 10 MG RE SUPP: 10.0000 mg | Freq: Every day | RECTAL | Status: AC | PRN

## 2011-10-22 MED ORDER — POLYETHYLENE GLYCOL 3350 17 G PO PACK: 17.0000 g | PACK | Freq: Every day | ORAL | Status: AC | PRN

## 2011-10-22 MED ORDER — DSS 100 MG PO CAPS: 100.0000 mg | ORAL_CAPSULE | Freq: Two times a day (BID) | ORAL | Status: AC

## 2011-10-22 MED ORDER — METHOCARBAMOL 500 MG PO TABS: 500.0000 mg | ORAL_TABLET | Freq: Four times a day (QID) | ORAL | Status: AC | PRN

## 2011-10-22 MED ORDER — LEVALBUTEROL HCL 0.63 MG/3ML IN NEBU: 0.6300 mg | INHALATION_SOLUTION | Freq: Four times a day (QID) | RESPIRATORY_TRACT | Status: DC | PRN

## 2011-10-22 MED ORDER — ONDANSETRON HCL 4 MG PO TABS: 4.0000 mg | ORAL_TABLET | Freq: Four times a day (QID) | ORAL | Status: AC | PRN

## 2011-10-22 MED ORDER — OXYCODONE HCL 5 MG PO TABS: 2.5000 mg | ORAL_TABLET | ORAL | Status: AC | PRN

## 2011-10-22 MED ORDER — RIVAROXABAN 10 MG PO TABS: 10.0000 mg | ORAL_TABLET | Freq: Every day | ORAL | Status: DC

## 2011-10-22 MED ORDER — TRAMADOL HCL 50 MG PO TABS: 50.0000 mg | ORAL_TABLET | Freq: Four times a day (QID) | ORAL | Status: AC | PRN

## 2011-10-22 MED ORDER — IOHEXOL 350 MG/ML SOLN
100.0000 mL | Freq: Once | INTRAVENOUS | Status: AC | PRN
  Administered 2011-10-22: 100 mL via INTRAVENOUS

## 2011-10-22 MED ORDER — ACETAMINOPHEN 325 MG PO TABS: 650.0000 mg | ORAL_TABLET | Freq: Four times a day (QID) | ORAL | Status: AC | PRN
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
## 2011-10-22 NOTE — Progress Notes (Signed)
Physical Therapy Treatment Patient Details Name: Jaime [name] MRN: [id number] DOB: [birth year] Today's Date: 10/22/2011 Time: 4098-1191 PT Time Calculation (min): 10 min  PT Assessment / Plan / Recommendation Comments on Treatment Session  Pt continues to have difficulty with R knee ROM. Encouraged pt (again) to allow ortho tech to place knee in CPM to at least get some PROM. Continue to recommend SNF for rehab.     Follow Up Recommendations  Skilled nursing facility    Barriers to Discharge        Equipment Recommendations  Defer to next venue    Recommendations for Other Services    Frequency 7X/week   Plan Discharge plan remains appropriate    Precautions / Restrictions Precautions Precautions: Fall;Knee Required Braces or Orthoses: Knee Immobilizer - Right Knee Immobilizer - Right: Discontinue once straight leg raise with < 10 degree lag Restrictions Weight Bearing Restrictions: No RLE Weight Bearing: Weight bearing as tolerated   Pertinent Vitals/Pain 5/10 after exercises    Mobility    Exercises Total Joint Exercises Ankle Circles/Pumps: AROM;Right;10 reps;Supine Quad Sets: AROM;Right;10 reps;Supine Short Arc Quad: AAROM;Right;10 reps;Supine Heel Slides: AAROM;Right;10 reps;Supine. Very little progression with range R knee: ~20-25 degrees. Hip ABduction/ADduction: AAROM;Right;10 reps;Supine Straight Leg Raises: AAROM;Right;10 reps;Supine   PT Diagnosis:    PT Problem List:   PT Treatment Interventions:     PT Goals    Visit Information  Last PT Received On: 10/22/11 Assistance Needed: +2    Subjective Data  Subjective: "I know I need to do the exercises" Patient Stated Goal: Less pain   Cognition  Overall Cognitive Status: Appears within functional limits for tasks assessed/performed Arousal/Alertness: Awake/alert Behavior During Session: [facility] for tasks performed    Balance     End of Session PT - End of Session Activity Tolerance: Patient  limited by pain Patient left: in bed;with call bell/phone within reach;with family/visitor present   GP     [name] [facility] 10/22/2011, 3:51 PM [phone]
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
## 2011-10-22 NOTE — Progress Notes (Signed)
Physical Therapy Treatment Patient Details Name: Jaime [name] MRN: [id number] DOB: 06-09-1927 Today's Date: 10/22/2011 Time: 2440-1027 PT Time Calculation (min): 39 min  PT Assessment / Plan / Recommendation Comments on Treatment Session  Slowly progressing with mobility and activity tolerance. Continue to recommend SNF.Possible d/c over weekend.     Follow Up Recommendations  Skilled nursing facility    Barriers to Discharge        Equipment Recommendations  Defer to next venue    Recommendations for Other Services    Frequency 7X/week   Plan Discharge plan remains appropriate    Precautions / Restrictions Precautions Precautions: Fall;Knee Required Braces or Orthoses: Knee Immobilizer - Right Knee Immobilizer - Right: Discontinue once straight leg raise with < 10 degree lag Restrictions Weight Bearing Restrictions: No RLE Weight Bearing: Weight bearing as tolerated   Pertinent Vitals/Pain     Mobility  Bed Mobility Bed Mobility: Supine to Sit;Sit to Supine Supine to Sit: 1: +2 Total assist Supine to Sit: Patient Percentage: 40% Sit to Supine: Patient Percentage: 40% Details for Bed Mobility Assistance: VCs safety, technique, hand placement. Assist for trunk to upright/supine and bil LEs onto/off bed Transfers Transfers: Sit to Stand;Stand to Sit Sit to Stand: 1: +2 Total assist;From bed;From elevated surface;From chair/3-in-1;With armrests;With upper extremity assist Sit to Stand: Patient Percentage: 60% Stand to Sit: 1: +2 Total assist;To chair/3-in-1;To bed;With upper extremity assist;With armrests Stand to Sit: Patient Percentage: 60% Stand Pivot Transfers: 1: +2 Total assist Stand Pivot Transfers: Patient Percentage: 60% Details for Transfer Assistance: VCs safety, technique, posture, hand placement. Assist to rise, stabilize, navigate with RW, control descent. Increased time. Sit<>stand x 3. Stand pivot x 2.  Ambulation/Gait Ambulation/Gait Assistance:  1: +2 Total assist Ambulation/Gait: Patient Percentage: 60% Ambulation Distance (Feet): 8 Feet (8'x1. 3'x1) Assistive device: Rolling walker Ambulation/Gait Assistance Details: VCs safety, technique, sequence. Assist to stabilize/support pt, navigate wirth RW. Increased time. Fatigues easily.  Gait Pattern: Step-to pattern;Decreased stance time - right;Antalgic;Decreased stride length;Decreased step length - right;Decreased step length - left;Wide base of support    Exercises     PT Diagnosis:    PT Problem List:   PT Treatment Interventions:     PT Goals Acute Rehab PT Goals PT Goal: Supine/Side to Sit - Progress: Progressing toward goal PT Goal: Sit to Supine/Side - Progress: Progressing toward goal PT Goal: Sit to Stand - Progress: Progressing toward goal PT Transfer Goal: Bed to Chair/Chair to Bed - Progress: Progressing toward goal PT Goal: Ambulate - Progress: Progressing toward goal  Visit Information  Last PT Received On: 10/22/11 Assistance Needed: +2    Subjective Data  Subjective: "We can try it" (oob) Patient Stated Goal: Less pain   Cognition  Overall Cognitive Status: Appears within functional limits for tasks assessed/performed Arousal/Alertness: Awake/alert Behavior During Session: [facility] for tasks performed    Balance     End of Session PT - End of Session Equipment Utilized During Treatment: Gait belt;Right knee immobilizer Activity Tolerance: Patient limited by pain;Patient limited by fatigue Patient left: in bed;with call bell/phone within reach;with family/visitor present   GP     [name] [facility] 10/22/2011, 11:32 AM [phone]

## 2011-10-22 NOTE — Progress Notes (Signed)
Occupational Therapy Treatment Patient Details Name: Jaime [name] MRN: [id number] DOB: 26-Feb-1928 Today's Date: 10/22/2011 Time: 1478-2956 OT Time Calculation (min): 39 min  OT Assessment / Plan / Recommendation Comments on Treatment Session      Follow Up Recommendations       Barriers to Discharge       Equipment Recommendations  Defer to next venue    Recommendations for Other Services    Frequency Min 2X/week   Plan Discharge plan remains appropriate    Precautions / Restrictions Precautions Precautions: Fall;Knee Required Braces or Orthoses: Knee Immobilizer - Right Knee Immobilizer - Right: Discontinue once straight leg raise with < 10 degree lag Restrictions Weight Bearing Restrictions: No RLE Weight Bearing: Weight bearing as tolerated   Pertinent Vitals/Pain 6/10 L knee; repostioned and ice.  Pt was premedicated    ADL  Toilet Transfer: Performed;+2 Total assistance Toilet Transfer: Patient Percentage: 60% Toilet Transfer Method: Stand pivot (cues and A to weight shift) Toileting - Clothing Manipulation and Hygiene: Performed;+1 Total assistance (could not reach to perform hygiene in standing:  small space) Where Assessed - Toileting Clothing Manipulation and Hygiene: Standing Transfers/Ambulation Related to ADLs: cotx with PT.  Pt walked to door.  She had already done ADLs and did not have endurance to stand for grooming today.  Combed hair while resting, sitting in chair.  Got back to bed after toilet transfer as she had been sitting up earlier ADL Comments: Pt making progress but decreased standing endurance.    OT Diagnosis:    OT Problem List:   OT Treatment Interventions:     OT Goals Acute Rehab OT Goals Time For Goal Achievement: 11/03/11 ADL Goals Additional ADL Goal #1: Pt will complete all aspects of toileting with mod assist on handicap hight commode with sink next to it. ADL Goal: Additional Goal #1 - Progress: Progressing toward  goals  Visit Information  Last OT Received On: 10/22/11 Assistance Needed: +2    Subjective Data      Prior Functioning       Cognition  Overall Cognitive Status: Appears within functional limits for tasks assessed/performed Arousal/Alertness: Awake/alert Behavior During Session: [facility] for tasks performed    Mobility Bed Mobility Bed Mobility: Supine to Sit;Sit to Supine Supine to Sit: 1: +2 Total assist Supine to Sit: Patient Percentage: 40% Sit to Supine: Patient Percentage: 40% Details for Bed Mobility Assistance: VCs safety, technique, hand placement. Assist for trunk to upright/supine and bil LEs onto/off bed Transfers Sit to Stand: 1: +2 Total assist;From bed;From elevated surface;From chair/3-in-1;With armrests;With upper extremity assist Sit to Stand: Patient Percentage: 60% Stand to Sit: 1: +2 Total assist;To chair/3-in-1;To bed;With upper extremity assist;With armrests Stand to Sit: Patient Percentage: 60% Details for Transfer Assistance:  (vcs for safety and assist to weight shift)   Exercises    Balance    End of Session OT - End of Session Equipment Utilized During Treatment: Gait belt Activity Tolerance: Patient limited by fatigue;Patient limited by pain Patient left: in bed;with call bell/phone within reach;with family/visitor present  GO     Jaime [name] 10/22/2011, 12:10 PM Marica Otter, OTR/L [phone] 10/22/2011

## 2011-10-22 NOTE — Progress Notes (Signed)
CSW assisting with d/c planning. D/C Summary faxed to [facility] in preparation for Saturday d/c. Week end CSW will assist with d/c to SNF in am.  Asher Muir Taegen Lennox LCSW [phone]
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
## 2011-10-23 NOTE — Progress Notes (Signed)
Occupational Therapy Treatment Patient Details Name: Jaime [name] MRN: [id number] DOB: 01/15/1928 Today's Date: 10/23/2011 Time: 4098-1191 OT Time Calculation (min): 38 min  OT Assessment / Plan / Recommendation Comments on Treatment Session      Follow Up Recommendations  Skilled nursing facility    Barriers to Discharge       Equipment Recommendations  Defer to next venue    Recommendations for Other Services    Frequency Min 2X/week   Plan Discharge plan remains appropriate    Precautions / Restrictions Precautions Precautions: Fall;Knee Required Braces or Orthoses: Knee Immobilizer - Right Knee Immobilizer - Right: Discontinue once straight leg raise with < 10 degree lag Restrictions RLE Weight Bearing: Weight bearing as tolerated   Pertinent Vitals/Pain 7/10 RLE with weightbearing.  Premedicated    ADL  Lower Body Bathing: Performed;+2 Total assistance Lower Body Bathing: Patient Percentage: 50% Where Assessed - Lower Body Bathing: Supported sit to stand Toilet Transfer: Performed;+2 Total assistance Toilet Transfer: Patient Percentage: 50% Statistician Method: Stand pivot Toileting - Clothing Manipulation and Hygiene: Performed;Moderate assistance Where Assessed - Toileting Clothing Manipulation and Hygiene: Standing Transfers/Ambulation Related to ADLs: performed bathing at EOB, +2 help for sit to stand; pt doing better  with advancing LLE.  PT came to help at end of toileting and overlapped    OT Diagnosis:    OT Problem List:   OT Treatment Interventions:     OT Goals Acute Rehab OT Goals Time For Goal Achievement: 11/03/11 ADL Goals Pt Will Perform Lower Body Bathing: with mod assist;Sit to stand from chair ADL Goal: Lower Body Bathing - Progress: Goal set today Additional ADL Goal #1: Pt will complete all aspects of toileting with mod assist on handicap hight commode with sink next to it. ADL Goal: Additional Goal #1 - Progress: Progressing  toward goals  Visit Information  Last OT Received On: 10/23/11 Assistance Needed: +2    Subjective Data      Prior Functioning       Cognition  Overall Cognitive Status: Appears within functional limits for tasks assessed/performed Behavior During Session: [facility] for tasks performed    Mobility Bed Mobility Supine to Sit: 1: +2 Total assist Supine to Sit: Patient Percentage: 40% Transfers Sit to Stand: 1: +2 Total assist;From bed;From elevated surface;From chair/3-in-1;With armrests;With upper extremity assist Sit to Stand: Patient Percentage:  (40-60%--did better from commode with arms)   Exercises    Balance    End of Session OT - End of Session Equipment Utilized During Treatment: Gait belt Activity Tolerance: Patient limited by fatigue;Patient limited by pain Patient left: in chair;with call bell/phone within reach;with family/visitor present  GO     Jaime [name] 10/23/2011, 10:57 AM Marica Otter, OTR/L [phone] 10/23/2011

## 2011-10-23 NOTE — Progress Notes (Signed)
Physical Therapy Treatment Patient Details Name: Jaime [name] MRN: [id number] DOB: 1927-05-05 Today's Date: 10/23/2011 Time: 4098-1191 PT Time Calculation (min): 12 min  PT Assessment / Plan / Recommendation Comments on Treatment Session  Pt on BSC with OT upon entering.  Pt assisted to standing and OT assisted with hygiene.  Pt then ambulated to doorway however fatigues quickly and recliner brought to pt.  Pt to d/c to SNF today.    Follow Up Recommendations  Skilled nursing facility    Barriers to Discharge        Equipment Recommendations  Defer to next venue    Recommendations for Other Services    Frequency     Plan Discharge plan remains appropriate;Frequency remains appropriate    Precautions / Restrictions Precautions Precautions: Fall;Knee Required Braces or Orthoses: Knee Immobilizer - Right Knee Immobilizer - Right: Discontinue once straight leg raise with < 10 degree lag Restrictions Weight Bearing Restrictions: No RLE Weight Bearing: Weight bearing as tolerated   Pertinent Vitals/Pain 7/10 R knee, premedicated, repositioned, ice packs applied    Mobility  Bed Mobility Supine to Sit: 1: +2 Total assist Supine to Sit: Patient Percentage: 40% Transfers Transfers: Sit to Stand;Stand to Sit Sit to Stand: 3: Mod assist;With armrests;From chair/3-in-1 Sit to Stand: Patient Percentage:  (40-60%--did better from commode with arms) Stand to Sit: 3: Mod assist;With armrests;To chair/3-in-1 Details for Transfer Assistance: assisted OT with BSC transfers, +2 for safety and hygiene, verbal cues for correct technique and hand placement, assist for weakness and completing forward weight shift Ambulation/Gait Ambulation/Gait Assistance: 3: Mod assist Ambulation Distance (Feet): 8 Feet Assistive device: Rolling walker Ambulation/Gait Assistance Details: verbal cues for safety, technique, sequence, RW distance, assist for stabilizing and navigating RW, increased  time Gait Pattern: Step-to pattern;Antalgic;Decreased stride length;Wide base of support Gait velocity: decreased    Exercises     PT Diagnosis:    PT Problem List:   PT Treatment Interventions:     PT Goals Acute Rehab PT Goals PT Goal: Sit to Stand - Progress: Partly met PT Goal: Ambulate - Progress: Partly met  Visit Information  Last PT Received On: 10/23/11 Assistance Needed: +2    Subjective Data  Subjective: "I'll work hard at rehab."   Cognition  Overall Cognitive Status: Appears within functional limits for tasks assessed/performed Behavior During Session: [facility] for tasks performed    Balance     End of Session PT - End of Session Equipment Utilized During Treatment: Gait belt;Right knee immobilizer Activity Tolerance: Patient limited by fatigue;Patient limited by pain Patient left: in chair;with call bell/phone within reach;with family/visitor present CPM Right Knee CPM Right Knee: Off   GP     [name],[name] 10/23/2011, 11:23 AM Pager: [phone]

## 2011-10-23 NOTE — Progress Notes (Signed)
Pt to be d/c today to    [facility] and Rehab.  Pt and family agreeable. Confirmed plans with facility.  Plan transfer via EMS.   Leron Croak, LCSWA [employer] [phone]

## 2011-10-23 NOTE — Progress Notes (Signed)
Discharged from floor via stretcher, EMT with pt. No changes in assessment. Maylea Soria  
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
## 2011-10-25 DIAGNOSIS — L03119 Cellulitis of unspecified part of limb: Secondary | ICD-10-CM

## 2011-10-25 DIAGNOSIS — L02419 Cutaneous abscess of limb, unspecified: Secondary | ICD-10-CM

## 2011-10-26 DIAGNOSIS — L02419 Cutaneous abscess of limb, unspecified: Secondary | ICD-10-CM

## 2011-10-26 DIAGNOSIS — L03119 Cellulitis of unspecified part of limb: Secondary | ICD-10-CM

## 2011-10-26 DIAGNOSIS — M1710 Unilateral primary osteoarthritis, unspecified knee: Secondary | ICD-10-CM

## 2011-10-26 DIAGNOSIS — D649 Anemia, unspecified: Secondary | ICD-10-CM

## 2011-10-26 DIAGNOSIS — K219 Gastro-esophageal reflux disease without esophagitis: Secondary | ICD-10-CM

## 2011-10-26 NOTE — Progress Notes (Signed)
Clinical Social Work Department CLINICAL SOCIAL WORK PLACEMENT NOTE 10/26/2011  Patient:  Jaime [name], Jaime [name]  Account Number:  [id number] Admit date:  10/18/2011  Clinical Social Worker:  Cori Razor, LCSW  Date/time:  10/19/2011 11:17 AM  Clinical Social Work is seeking post-discharge placement for this patient at the following level of care:   SKILLED NURSING   (*CSW will update this form in Epic as items are completed)   10/19/2011  Patient/family provided with [facility] Department of Clinical Social Work's list of facilities offering this level of care within the geographic area requested by the patient (or if unable, by the patient's family).  10/19/2011  Patient/family informed of their freedom to choose among providers that offer the needed level of care, that participate in Medicare, Medicaid or managed care program needed by the patient, have an available bed and are willing to accept the patient.    Patient/family informed of [facility]' ownership interest in [facility], as well as of the fact that they are under no obligation to receive care at this facility.  PASARR submitted to EDS on 10/19/2011 PASARR number received from EDS on 10/19/2011  FL2 transmitted to all facilities in geographic area requested by pt/family on  10/19/2011 FL2 transmitted to all facilities within larger geographic area on   Patient informed that his/her managed care company has contracts with or will negotiate with  certain facilities, including the following:     Patient/family informed of bed offers received:  10/20/2011 Patient chooses bed at [facility] Physician recommends and patient chooses bed at    Patient to be transferred to [facility] on  10/23/2011 Patient to be transferred to facility by EMS  The following physician request were entered in Epic:   Additional Comments:  Cori Razor LCSW [phone]

## 2011-11-05 DIAGNOSIS — L259 Unspecified contact dermatitis, unspecified cause: Secondary | ICD-10-CM

## 2011-11-05 DIAGNOSIS — M129 Arthropathy, unspecified: Secondary | ICD-10-CM

## 2011-11-15 ENCOUNTER — Telehealth: Payer: Self-pay

## 2011-11-15 NOTE — Telephone Encounter (Signed)
Please call to check on pt. 

## 2011-11-15 NOTE — Telephone Encounter (Signed)
Triage Record Num: [id number] Operator: Tarri Glenn Patient Name: Jaime [name] Call Date & Time: 11/14/2011 8:14:20PM Patient Phone: [phone] PCP: Tillman Abide Patient Gender: Female PCP Fax : [phone] Patient DOB: 11/18/1927 Practice Name: [facility] Reason for Call: Caller: Cathy/LPN; PCP: Tillman Abide; CB#: [phone]; Call regarding Zantac; Lynden Ang, LPN, from [facility] is calling about patient is complaining of mild nausea related to something that she ate at dinner. Patient was offered Zofran as she has the order as needed but refused and requested Zantac. All emergent signs and symptoms ruled out per Nausea or Vomiting protocol. Advised LPN to encourage patient to take the Zofran and to talk with office 11/15/11 about nausea to see if Dr. Alphonsus Sias would want to change any medications. Protocol(s) Used: Nausea or Vomiting Recommended Outcome per Protocol: Provide Home/Self Care Reason for Outcome: All other situations Care Advice: ~

## 2011-11-16 NOTE — Telephone Encounter (Signed)
I will evaluate her tomorrow.

## 2011-11-16 NOTE — Telephone Encounter (Signed)
Spoke with [place], and pt is still having nausea, took Zofran yesterday and today which seems to help, the staff have put a note on Dr.Letvaks "book" about pt. Pt also would like a consult with urologist because of urinary frequency. Will forward to Dr.Letvak also.

## 2011-11-17 DIAGNOSIS — R350 Frequency of micturition: Secondary | ICD-10-CM

## 2011-11-18 DIAGNOSIS — N3090 Cystitis, unspecified without hematuria: Secondary | ICD-10-CM

## 2011-11-29 ENCOUNTER — Ambulatory Visit: Payer: Self-pay | Admitting: Internal Medicine

## 2012-03-01 ENCOUNTER — Ambulatory Visit: Payer: Medicare Other | Admitting: Internal Medicine

## 2012-03-03 ENCOUNTER — Observation Stay: Payer: Self-pay | Admitting: Surgery

## 2012-03-03 LAB — CBC
HCT: 27.3 % — ABNORMAL LOW (ref 35.0–47.0)
MCH: 33.3 pg (ref 26.0–34.0)
MCHC: 33 g/dL (ref 32.0–36.0)
MCV: 101 fL — ABNORMAL HIGH (ref 80–100)
Platelet: 218 10*3/uL (ref 150–440)
WBC: 12.7 10*3/uL — ABNORMAL HIGH (ref 3.6–11.0)

## 2012-03-03 LAB — COMPREHENSIVE METABOLIC PANEL
Albumin: 3.7 g/dL (ref 3.4–5.0)
Anion Gap: 7 (ref 7–16)
BUN: 17 mg/dL (ref 7–18)
Bilirubin,Total: 0.7 mg/dL (ref 0.2–1.0)
Calcium, Total: 8.9 mg/dL (ref 8.5–10.1)
Co2: 25 mmol/L (ref 21–32)
Creatinine: 0.68 mg/dL (ref 0.60–1.30)
EGFR (Non-African Amer.): >60
Glucose: 120 mg/dL — ABNORMAL HIGH (ref 65–99)
Osmolality: 277 (ref 275–301)
Potassium: 4.2 mmol/L (ref 3.5–5.1)
Sodium: 137 mmol/L (ref 136–145)

## 2012-03-03 LAB — TROPONIN I: Troponin-I: <0.02 ng/mL

## 2012-03-03 LAB — PROTIME-INR: INR: 0.9

## 2012-03-03 LAB — APTT: Activated PTT: 36.3 secs — ABNORMAL HIGH (ref 23.6–35.9)

## 2012-03-04 LAB — BASIC METABOLIC PANEL
BUN: 13 mg/dL (ref 7–18)
Calcium, Total: 8.4 mg/dL — ABNORMAL LOW (ref 8.5–10.1)
Creatinine: 0.96 mg/dL (ref 0.60–1.30)
EGFR (African American): >60
EGFR (Non-African Amer.): 55 — ABNORMAL LOW
Glucose: 97 mg/dL (ref 65–99)
Sodium: 136 mmol/L (ref 136–145)

## 2012-03-04 LAB — CBC WITH DIFFERENTIAL/PLATELET
Basophil #: 0.1 10*3/uL (ref 0.0–0.1)
Basophil %: 0.4 %
Eosinophil #: 0 10*3/uL (ref 0.0–0.7)
Eosinophil %: 0 %
HCT: 28.1 % — ABNORMAL LOW (ref 35.0–47.0)
HGB: 9.2 g/dL — ABNORMAL LOW (ref 12.0–16.0)
Lymphocyte #: 2.4 10*3/uL (ref 1.0–3.6)
MCV: 101 fL — ABNORMAL HIGH (ref 80–100)
Monocyte #: 1.5 x10 3/mm — ABNORMAL HIGH (ref 0.2–0.9)
Monocyte %: 8.7 %
Neutrophil #: 13 10*3/uL — ABNORMAL HIGH (ref 1.4–6.5)
RBC: 2.77 10*6/uL — ABNORMAL LOW (ref 3.80–5.20)
RDW: 20.2 % — ABNORMAL HIGH (ref 11.5–14.5)
WBC: 16.9 10*3/uL — ABNORMAL HIGH (ref 3.6–11.0)

## 2012-03-04 LAB — HEPATIC FUNCTION PANEL A (ARMC)
Albumin: 2.8 g/dL — ABNORMAL LOW (ref 3.4–5.0)
SGOT(AST): 37 U/L (ref 15–37)
SGPT (ALT): 27 U/L (ref 12–78)
Total Protein: 6.2 g/dL — ABNORMAL LOW (ref 6.4–8.2)

## 2012-03-04 LAB — LIPASE, BLOOD: Lipase: 42 U/L — ABNORMAL LOW (ref 73–393)

## 2012-03-06 ENCOUNTER — Observation Stay: Payer: Self-pay | Admitting: Surgery

## 2012-03-06 LAB — CBC WITH DIFFERENTIAL/PLATELET
Basophil %: 0.9 %
Eosinophil %: 0.3 %
HCT: 27.7 % — ABNORMAL LOW (ref 35.0–47.0)
HGB: 9 g/dL — ABNORMAL LOW (ref 12.0–16.0)
Lymphocyte #: 1.5 10*3/uL (ref 1.0–3.6)
Lymphocyte %: 13.5 %
MCH: 33.2 pg (ref 26.0–34.0)
MCHC: 32.6 g/dL (ref 32.0–36.0)
MCV: 102 fL — ABNORMAL HIGH (ref 80–100)
Monocyte #: 1.1 x10 3/mm — ABNORMAL HIGH (ref 0.2–0.9)
Monocyte %: 9.8 %
Platelet: 237 10*3/uL (ref 150–440)
WBC: 11.2 10*3/uL — ABNORMAL HIGH (ref 3.6–11.0)

## 2012-03-06 LAB — PROTIME-INR: INR: 1.1

## 2012-03-06 LAB — APTT: Activated PTT: 39.4 secs — ABNORMAL HIGH (ref 23.6–35.9)

## 2012-03-06 LAB — COMPREHENSIVE METABOLIC PANEL
Albumin: 2.7 g/dL — ABNORMAL LOW (ref 3.4–5.0)
Alkaline Phosphatase: 122 U/L (ref 50–136)
Bilirubin,Total: 0.9 mg/dL (ref 0.2–1.0)
Calcium, Total: 8.8 mg/dL (ref 8.5–10.1)
Chloride: 104 mmol/L (ref 98–107)
Co2: 23 mmol/L (ref 21–32)
Creatinine: 0.57 mg/dL — ABNORMAL LOW (ref 0.60–1.30)
EGFR (African American): >60
EGFR (Non-African Amer.): >60
Osmolality: 270 (ref 275–301)
SGOT(AST): 15 U/L (ref 15–37)

## 2012-03-06 LAB — LIPASE, BLOOD: Lipase: 47 U/L — ABNORMAL LOW (ref 73–393)

## 2012-03-06 LAB — AMYLASE: Amylase: 19 U/L — ABNORMAL LOW (ref 25–115)

## 2012-03-07 LAB — COMPREHENSIVE METABOLIC PANEL
Albumin: 2.3 g/dL — ABNORMAL LOW (ref 3.4–5.0)
Alkaline Phosphatase: 92 U/L (ref 50–136)
BUN: 10 mg/dL (ref 7–18)
Calcium, Total: 8.3 mg/dL — ABNORMAL LOW (ref 8.5–10.1)
Chloride: 112 mmol/L — ABNORMAL HIGH (ref 98–107)
EGFR (African American): >60
EGFR (Non-African Amer.): >60
Glucose: 113 mg/dL — ABNORMAL HIGH (ref 65–99)
Potassium: 3.9 mmol/L (ref 3.5–5.1)
SGOT(AST): 16 U/L (ref 15–37)
Total Protein: 5.7 g/dL — ABNORMAL LOW (ref 6.4–8.2)

## 2012-03-07 LAB — CBC WITH DIFFERENTIAL/PLATELET
Bands: 4 %
Basophil #: 0 10*3/uL (ref 0.0–0.1)
Basophil #: 0.1 10*3/uL (ref 0.0–0.1)
Basophil %: 0.8 %
Eosinophil %: 1.2 %
Eosinophil: 1 %
HCT: 23.2 % — ABNORMAL LOW (ref 35.0–47.0)
HGB: 7.6 g/dL — ABNORMAL LOW (ref 12.0–16.0)
HGB: 7.7 g/dL — ABNORMAL LOW (ref 12.0–16.0)
Lymphocyte %: 25.5 %
Lymphocyte %: 19.5 %
Lymphocytes: 24 %
MCH: 33.4 pg (ref 26.0–34.0)
MCHC: 32.7 g/dL (ref 32.0–36.0)
Monocyte #: 0.6 x10 3/mm (ref 0.2–0.9)
Monocyte #: 0.7 x10 3/mm (ref 0.2–0.9)
Monocyte %: 10.2 %
Monocytes: 3 %
Neutrophil #: 3.8 10*3/uL (ref 1.4–6.5)
Neutrophil %: 61.6 %
Neutrophil %: 69.7 %
Platelet: 251 10*3/uL (ref 150–440)
RBC: 2.31 10*6/uL — ABNORMAL LOW (ref 3.80–5.20)
RDW: 19.6 % — ABNORMAL HIGH (ref 11.5–14.5)
Segmented Neutrophils: 68 %
WBC: 6.1 10*3/uL (ref 3.6–11.0)
WBC: 7.6 10*3/uL (ref 3.6–11.0)

## 2012-03-07 LAB — LIPASE, BLOOD: Lipase: 47 U/L — ABNORMAL LOW (ref 73–393)

## 2012-03-08 LAB — CBC WITH DIFFERENTIAL/PLATELET
Basophil %: 0.6 %
Eosinophil %: 1.8 %
HCT: 24 % — ABNORMAL LOW (ref 35.0–47.0)
HGB: 8.2 g/dL — ABNORMAL LOW (ref 12.0–16.0)
Lymphocyte %: 30 %
MCH: 34.8 pg — ABNORMAL HIGH (ref 26.0–34.0)
Neutrophil %: 58.1 %
RBC: 2.37 10*6/uL — ABNORMAL LOW (ref 3.80–5.20)
WBC: 6.4 10*3/uL (ref 3.6–11.0)

## 2012-03-12 LAB — CULTURE, BLOOD (SINGLE)

## 2012-07-11 ENCOUNTER — Ambulatory Visit: Payer: Self-pay | Admitting: Internal Medicine

## 2012-11-10 ENCOUNTER — Encounter: Payer: Self-pay | Admitting: Internal Medicine

## 2012-11-13 ENCOUNTER — Emergency Department: Payer: Self-pay | Admitting: Emergency Medicine

## 2013-02-02 ENCOUNTER — Ambulatory Visit: Payer: Self-pay | Admitting: Internal Medicine

## 2013-05-10 ENCOUNTER — Ambulatory Visit: Payer: Self-pay | Admitting: Otolaryngology

## 2013-05-18 ENCOUNTER — Ambulatory Visit: Payer: Self-pay | Admitting: Otolaryngology

## 2014-06-07 ENCOUNTER — Ambulatory Visit: Payer: Self-pay | Admitting: Physician Assistant

## 2014-07-30 ENCOUNTER — Encounter (HOSPITAL_COMMUNITY): Payer: Self-pay | Admitting: General Practice

## 2014-07-30 ENCOUNTER — Emergency Department
Admit: 2014-07-30 | Disposition: A | Discharge status: Other Acute Inpatient Hospital | Payer: Self-pay | Admitting: Emergency Medicine

## 2014-07-30 ENCOUNTER — Inpatient Hospital Stay (HOSPITAL_COMMUNITY)
Admission: EM | Admit: 2014-07-30 | Discharge: 2014-08-09 | DRG: 025 | Disposition: A | Discharge status: Rehabilitation Facility | Payer: Medicare PPO | Source: Other Acute Inpatient Hospital | Attending: Neurosurgery | Admitting: Neurosurgery

## 2014-07-30 DIAGNOSIS — R4701 Aphasia: Secondary | ICD-10-CM | POA: Diagnosis not present

## 2014-07-30 DIAGNOSIS — E46 Unspecified protein-calorie malnutrition: Secondary | ICD-10-CM

## 2014-07-30 DIAGNOSIS — R739 Hyperglycemia, unspecified: Secondary | ICD-10-CM | POA: Diagnosis not present

## 2014-07-30 DIAGNOSIS — G9340 Encephalopathy, unspecified: Secondary | ICD-10-CM | POA: Diagnosis not present

## 2014-07-30 DIAGNOSIS — G9341 Metabolic encephalopathy: Secondary | ICD-10-CM | POA: Diagnosis not present

## 2014-07-30 DIAGNOSIS — J9811 Atelectasis: Secondary | ICD-10-CM | POA: Diagnosis not present

## 2014-07-30 DIAGNOSIS — I6200 Nontraumatic subdural hemorrhage, unspecified: Secondary | ICD-10-CM | POA: Diagnosis not present

## 2014-07-30 DIAGNOSIS — S065X9A Traumatic subdural hemorrhage with loss of consciousness of unspecified duration, initial encounter: Secondary | ICD-10-CM

## 2014-07-30 DIAGNOSIS — G8191 Hemiplegia, unspecified affecting right dominant side: Secondary | ICD-10-CM | POA: Diagnosis present

## 2014-07-30 DIAGNOSIS — D649 Anemia, unspecified: Secondary | ICD-10-CM | POA: Diagnosis present

## 2014-07-30 DIAGNOSIS — J9601 Acute respiratory failure with hypoxia: Secondary | ICD-10-CM | POA: Diagnosis not present

## 2014-07-30 DIAGNOSIS — G4089 Other seizures: Secondary | ICD-10-CM | POA: Diagnosis not present

## 2014-07-30 DIAGNOSIS — T380X5A Adverse effect of glucocorticoids and synthetic analogues, initial encounter: Secondary | ICD-10-CM | POA: Diagnosis not present

## 2014-07-30 DIAGNOSIS — Z4659 Encounter for fitting and adjustment of other gastrointestinal appliance and device: Secondary | ICD-10-CM

## 2014-07-30 DIAGNOSIS — J811 Chronic pulmonary edema: Secondary | ICD-10-CM | POA: Diagnosis present

## 2014-07-30 DIAGNOSIS — S065X4S Traumatic subdural hemorrhage with loss of consciousness of 6 hours to 24 hours, sequela: Secondary | ICD-10-CM | POA: Diagnosis not present

## 2014-07-30 DIAGNOSIS — Z66 Do not resuscitate: Secondary | ICD-10-CM | POA: Diagnosis present

## 2014-07-30 DIAGNOSIS — H9190 Unspecified hearing loss, unspecified ear: Secondary | ICD-10-CM | POA: Diagnosis present

## 2014-07-30 DIAGNOSIS — J9690 Respiratory failure, unspecified, unspecified whether with hypoxia or hypercapnia: Secondary | ICD-10-CM

## 2014-07-30 DIAGNOSIS — Z87891 Personal history of nicotine dependence: Secondary | ICD-10-CM | POA: Diagnosis not present

## 2014-07-30 DIAGNOSIS — K449 Diaphragmatic hernia without obstruction or gangrene: Secondary | ICD-10-CM | POA: Diagnosis present

## 2014-07-30 DIAGNOSIS — J9600 Acute respiratory failure, unspecified whether with hypoxia or hypercapnia: Secondary | ICD-10-CM

## 2014-07-30 DIAGNOSIS — S065XAA Traumatic subdural hemorrhage with loss of consciousness status unknown, initial encounter: Secondary | ICD-10-CM | POA: Diagnosis present

## 2014-07-30 DIAGNOSIS — Z781 Physical restraint status: Secondary | ICD-10-CM | POA: Diagnosis not present

## 2014-07-30 DIAGNOSIS — W19XXXA Unspecified fall, initial encounter: Secondary | ICD-10-CM | POA: Diagnosis present

## 2014-07-30 DIAGNOSIS — Z7901 Long term (current) use of anticoagulants: Secondary | ICD-10-CM

## 2014-07-30 DIAGNOSIS — H540 Blindness, both eyes: Secondary | ICD-10-CM | POA: Diagnosis present

## 2014-07-30 DIAGNOSIS — M1990 Unspecified osteoarthritis, unspecified site: Secondary | ICD-10-CM | POA: Diagnosis present

## 2014-07-30 DIAGNOSIS — I10 Essential (primary) hypertension: Secondary | ICD-10-CM | POA: Diagnosis present

## 2014-07-30 DIAGNOSIS — R51 Headache: Secondary | ICD-10-CM | POA: Diagnosis present

## 2014-07-30 DIAGNOSIS — Z96651 Presence of right artificial knee joint: Secondary | ICD-10-CM | POA: Diagnosis present

## 2014-07-30 DIAGNOSIS — S069X9A Unspecified intracranial injury with loss of consciousness of unspecified duration, initial encounter: Secondary | ICD-10-CM | POA: Diagnosis present

## 2014-07-30 DIAGNOSIS — R561 Post traumatic seizures: Secondary | ICD-10-CM | POA: Diagnosis not present

## 2014-07-30 DIAGNOSIS — K219 Gastro-esophageal reflux disease without esophagitis: Secondary | ICD-10-CM | POA: Diagnosis present

## 2014-07-30 DIAGNOSIS — S069XAA Unspecified intracranial injury with loss of consciousness status unknown, initial encounter: Secondary | ICD-10-CM | POA: Diagnosis present

## 2014-07-30 DIAGNOSIS — R451 Restlessness and agitation: Secondary | ICD-10-CM | POA: Diagnosis not present

## 2014-07-30 DIAGNOSIS — Z87828 Personal history of other (healed) physical injury and trauma: Secondary | ICD-10-CM | POA: Diagnosis present

## 2014-07-30 DIAGNOSIS — H9193 Unspecified hearing loss, bilateral: Secondary | ICD-10-CM | POA: Diagnosis not present

## 2014-07-30 DIAGNOSIS — R1310 Dysphagia, unspecified: Secondary | ICD-10-CM | POA: Diagnosis not present

## 2014-07-30 DIAGNOSIS — S065X0S Traumatic subdural hemorrhage without loss of consciousness, sequela: Secondary | ICD-10-CM | POA: Diagnosis not present

## 2014-07-30 DIAGNOSIS — Z9289 Personal history of other medical treatment: Secondary | ICD-10-CM

## 2014-07-30 DIAGNOSIS — S065X0A Traumatic subdural hemorrhage without loss of consciousness, initial encounter: Secondary | ICD-10-CM | POA: Diagnosis not present

## 2014-07-30 DIAGNOSIS — H547 Unspecified visual loss: Secondary | ICD-10-CM | POA: Insufficient documentation

## 2014-07-30 DIAGNOSIS — R569 Unspecified convulsions: Secondary | ICD-10-CM | POA: Diagnosis not present

## 2014-07-30 DIAGNOSIS — G8929 Other chronic pain: Secondary | ICD-10-CM | POA: Diagnosis not present

## 2014-07-30 LAB — APTT: ACTIVATED PTT: 31.2 s (ref 23.6–35.9)

## 2014-07-30 LAB — CBC
HCT: 28.9 % — AB (ref 35.0–47.0)
HGB: 9.3 g/dL — ABNORMAL LOW (ref 12.0–16.0)
MCH: 31.8 pg (ref 26.0–34.0)
MCHC: 32.1 g/dL (ref 32.0–36.0)
MCV: 99 fL (ref 80–100)
PLATELETS: 233 10*3/uL (ref 150–440)
RBC: 2.91 10*6/uL — AB (ref 3.80–5.20)
RDW: 23.5 % — ABNORMAL HIGH (ref 11.5–14.5)
WBC: 11.4 10*3/uL — ABNORMAL HIGH (ref 3.6–11.0)

## 2014-07-30 LAB — COMPREHENSIVE METABOLIC PANEL
ALBUMIN: 3.9 g/dL
ALK PHOS: 49 U/L
Anion Gap: 6 — ABNORMAL LOW (ref 7–16)
BUN: 23 mg/dL — ABNORMAL HIGH
Bilirubin,Total: 1.1 mg/dL
CREATININE: 0.64 mg/dL
Calcium, Total: 8.9 mg/dL
Chloride: 107 mmol/L
Co2: 27 mmol/L
EGFR (Non-African Amer.): >60
Glucose: 110 mg/dL — ABNORMAL HIGH
Potassium: 3.6 mmol/L
SGOT(AST): 15 U/L
SGPT (ALT): 10 U/L — ABNORMAL LOW
Sodium: 140 mmol/L
TOTAL PROTEIN: 6.7 g/dL

## 2014-07-30 LAB — TROPONIN I: Troponin-I: <0.03 ng/mL

## 2014-07-30 LAB — MRSA PCR SCREENING: MRSA by PCR: NEGATIVE

## 2014-07-30 LAB — PROTIME-INR
INR: 1
Prothrombin Time: 13.3 secs

## 2014-07-30 MED ORDER — OXYCODONE-ACETAMINOPHEN 5-325 MG PO TABS
1.0000 | ORAL_TABLET | ORAL | Status: DC | PRN
  Administered 2014-07-30: 2 via ORAL
  Administered 2014-07-31: 1 via ORAL
  Filled 2014-07-30: qty 2
  Filled 2014-07-30: qty 1

## 2014-07-30 MED ORDER — POTASSIUM CHLORIDE IN NACL 20-0.9 MEQ/L-% IV SOLN
INTRAVENOUS | Status: DC
  Administered 2014-07-30 – 2014-07-31 (×3): via INTRAVENOUS
  Filled 2014-07-30 (×6): qty 1000

## 2014-07-30 MED ORDER — TORSEMIDE 20 MG PO TABS: 20.0000 mg | ORAL_TABLET | ORAL | Status: DC

## 2014-07-30 MED ORDER — PANTOPRAZOLE SODIUM 40 MG PO TBEC
40.0000 mg | DELAYED_RELEASE_TABLET | Freq: Every day | ORAL | Status: DC
  Administered 2014-07-30 – 2014-07-31 (×2): 40 mg via ORAL
  Filled 2014-07-30 (×2): qty 1

## 2014-07-30 MED ORDER — ONDANSETRON HCL 4 MG PO TABS: 4.0000 mg | ORAL_TABLET | Freq: Four times a day (QID) | ORAL | Status: DC | PRN

## 2014-07-30 MED ORDER — ONDANSETRON HCL 4 MG/2ML IJ SOLN
4.0000 mg | Freq: Four times a day (QID) | INTRAMUSCULAR | Status: DC | PRN
  Administered 2014-07-30: 4 mg via INTRAVENOUS
  Filled 2014-07-30: qty 2

## 2014-07-30 MED ORDER — MAGNESIUM OXIDE 400 (241.3 MG) MG PO TABS
400.0000 mg | ORAL_TABLET | Freq: Every day | ORAL | Status: DC
  Administered 2014-07-30 – 2014-07-31 (×2): 400 mg via ORAL
  Filled 2014-07-30 (×3): qty 1

## 2014-07-30 MED ORDER — PANTOPRAZOLE SODIUM 40 MG IV SOLR
40.0000 mg | Freq: Every day | INTRAVENOUS | Status: DC
  Filled 2014-07-30 (×2): qty 40

## 2014-07-30 MED ORDER — ALBUTEROL SULFATE HFA 108 (90 BASE) MCG/ACT IN AERS: 2.0000 | INHALATION_SPRAY | RESPIRATORY_TRACT | Status: DC | PRN

## 2014-07-30 MED ORDER — FLUTICASONE PROPIONATE 50 MCG/ACT NA SUSP
1.0000 | Freq: Every day | NASAL | Status: DC
Start: 2014-07-30 — End: 2014-08-01
  Administered 2014-07-30: 1 via NASAL
  Filled 2014-07-30: qty 16

## 2014-07-30 MED ORDER — ACETAMINOPHEN 325 MG PO TABS: 650.0000 mg | ORAL_TABLET | ORAL | Status: DC | PRN

## 2014-07-30 MED ORDER — CETYLPYRIDINIUM CHLORIDE 0.05 % MT LIQD
7.0000 mL | Freq: Two times a day (BID) | OROMUCOSAL | Status: DC
  Administered 2014-07-30 – 2014-07-31 (×2): 7 mL via OROMUCOSAL

## 2014-07-30 MED ORDER — ALBUTEROL SULFATE (2.5 MG/3ML) 0.083% IN NEBU: 2.5000 mg | INHALATION_SOLUTION | RESPIRATORY_TRACT | Status: DC | PRN

## 2014-07-30 MED ORDER — FESOTERODINE FUMARATE ER 8 MG PO TB24
8.0000 mg | ORAL_TABLET | Freq: Every day | ORAL | Status: DC
  Administered 2014-07-30 – 2014-07-31 (×2): 8 mg via ORAL
  Filled 2014-07-30 (×3): qty 1

## 2014-07-30 MED ORDER — FENTANYL 12 MCG/HR TD PT72
12.5000 ug | MEDICATED_PATCH | TRANSDERMAL | Status: DC
  Administered 2014-07-30: 12.5 ug via TRANSDERMAL
  Filled 2014-07-30: qty 1

## 2014-07-30 MED ORDER — HYDROMORPHONE HCL 1 MG/ML IJ SOLN
1.0000 mg | INTRAMUSCULAR | Status: DC | PRN
  Administered 2014-07-30 (×2): 0.5 mg via INTRAVENOUS
  Filled 2014-07-30 (×2): qty 1

## 2014-07-30 MED ORDER — OXYCODONE HCL 5 MG PO TABS
5.0000 mg | ORAL_TABLET | ORAL | Status: DC | PRN
  Administered 2014-07-30: 5 mg via ORAL
  Filled 2014-07-30: qty 1
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
## 2014-07-30 NOTE — H&P (Signed)
PATIENT NAME:  Jaime [name], Carlette S MR#:  [id number] DATE OF BIRTH:  February 16, 1928  DATE OF ADMISSION:  03/06/2012  HISTORY OF PRESENT ILLNESS: Please refer to my History and Physical of three days ago (03/03/2012).   HISTORY OF PRESENT ILLNESS: Ms. Myer HaffYarbrough underwent laparoscopic cholecystectomy for acalculous acute cholecystitis that was suppurative and on postoperative day one she insisted on being discharged from the hospital. She was discharged with a Jackson-Pratt drain in place and since being home has suffered right upper quadrant abdominal pain and severe nausea. She has not vomited. She has passed flatus and she is having some diarrhea as well. She is quite distraught as she does not like the hospital, but she did re-seek medical attention today. She has not been febrile and she denies chills and she has had no change in the color of her skin, urine, or stool.   PAST MEDICAL HISTORY: Please refer to my History and Physical of 03/03/2012.  Past medical history, medications, allergies, social history, family history, and review of systems are all unchanged.   PHYSICAL EXAMINATION:  GENERAL: Temperature 97.7, pulse 103, respirations 28, blood pressure 145/68, oxygen saturation 95% at rest on room air. The remainder of the physical examination is unchanged from three days ago. Specifically, the Jackson-Pratt effluent is serosanguineous with no evidence of biliary drainage.   LABORATORY STUDIES: White blood cell count 11.2 (significantly improved from two days ago), hemoglobin 9 (stable), hematocrit 27.7%, platelet count normal. PT/INR and PTT normal. Electrolytes including serum creatinine normal. Hepatic profile normal and specifically the patient's bilirubin is 0.9. SGOT, SGPT, and alkaline phosphatase are all normal. No amylase or lipase was obtained. The PA and lateral chest x-ray reveals atelectasis and gaseous distention in a large hiatal hernia or portion of an intrathoracic stomach.    ASSESSMENT: Right upper quadrant pain and nausea following laparoscopic cholecystectomy that appears to be uncomplicated.    PLAN: Admit to the hospital for IV fluid hydration, antiemetics, and observation.     ____________________________ Claude MangesWilliam F. Jamarie Mussa, MD wfm:bjt D: 03/06/2012 14:58:38 ET T: 03/06/2012 15:29:14 ET JOB#: [id number]  cc: Claude MangesWilliam F. Yadier Bramhall, MD, <Dictator> Claude MangesWILLIAM F Jaylena Holloway MD ELECTRONICALLY SIGNED 03/08/2012 21:16

## 2014-07-30 NOTE — Op Note (Signed)
PATIENT NAME:  Jaime [name], Jaime S MR#:  [id number] DATE OF BIRTH:  10-22-1927  DATE OF PROCEDURE:  03/03/2012  PREOPERATIVE DIAGNOSIS: Acute cholecystitis.   POSTOPERATIVE DIAGNOSIS: Acute cholecystitis, possibly acalculous.  PROCEDURE: Laparoscopic cholecystectomy  SURGEON: Brean Carberry A. Nakeitha Milligan, M.D.   ESTIMATED BLOOD LOSS: 50 mL.   ANESTHESIA: General.  COMPLICATIONS: None.  INDICATION FOR SURGERY: Ms. Jaime [name] is a pleasant 81 year old female who comes in with acute onset right upper quadrant pain, pericholecystic fluid, gallbladder wall thickening, and elevated white blood cell count. She was brought to the Operating Room for laparoscopic cholecystectomy.   DETAILS OF PROCEDURE: Ms. Jaime [name] was brought to the Operating Room suite. She was laid supine on the operating room table. She was induced, endotracheal tube was placed, and general anesthesia was administered. Her abdomen was then prepped and draped in standard surgical fashion and a supraumbilical incision was made. It was brought down to the fascia, the fascia was grasped, the fascia was incised. A finger was placed into the abdomen. There were no adhesions. Two 0 Vicryl stay sutures were placed in the abdomen. A Hassan trocar was placed in the abdomen and the abdomen was insufflated. The gallbladder was severely inflamed and initially was hydropic, but then later when I got into it there was some purulence as well. An 11 mm epigastric port and two 5 mm ports were then placed. The gallbladder was retracted over the dome of the liver. Multiple adhesions were taken off it. There was a significant inflammation. The cystic duct was dissected out as well as the cystic artery. The cystic artery was ligated with clips after critical view was obtained. The duct was extremely short and the common duct was easily visualized. I was able to put two clips just at the base of the cystic duct as it was short and then I ligated it. The  gallbladder was then taken off the gallbladder fossa. The clips were reexamined. Due to the fact that it had a short purchase with increased risk that the clip could be inadvertantly dislodged, I did leave a 19 JamaicaFrench channel drain. It was sutured in the lateral most port site with 3-0 nylon. The gallbladder fossa was then examined and it was hemostatic. The abdomen was irrigated. There was no obvious bleeding. The trocars were then removed. The supraumbilical fascia was closed using 0 Vicryl. The port sites were closed using 4-0 Monocryl, interrupted. Steri-Strips, Telfa and Tegaderm were then used to complete the dressing. The patient was then awoken, extubated, and brought to the postanesthesia care unit. Needle, sponge, and instrument counts were correct at the end of the procedure.  ____________________________ Si Raiderhristopher A. Kaysa Roulhac, MD cal:slb D: 03/03/2012 18:50:18 ET T: 03/04/2012 10:26:36 ET JOB#: [id number]  cc: Cristal Deerhristopher A. Jamesa Tedrick, MD, <Dictator> Jarvis NewcomerHRISTOPHER A Royalty Domagala MD ELECTRONICALLY SIGNED 03/06/2012 8:04
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
## 2014-07-31 ENCOUNTER — Inpatient Hospital Stay (HOSPITAL_COMMUNITY): Payer: Medicare PPO

## 2014-07-31 LAB — BASIC METABOLIC PANEL
ANION GAP: 5 (ref 5–15)
BUN: 13 mg/dL (ref 6–23)
CHLORIDE: 105 mmol/L (ref 96–112)
CO2: 27 mmol/L (ref 19–32)
Calcium: 8.2 mg/dL — ABNORMAL LOW (ref 8.4–10.5)
Creatinine, Ser: 0.55 mg/dL (ref 0.50–1.10)
GFR calc non Af Amer: 84 mL/min — ABNORMAL LOW (ref >90)
Glucose, Bld: 93 mg/dL (ref 70–99)
Potassium: 4 mmol/L (ref 3.5–5.1)
Sodium: 137 mmol/L (ref 135–145)

## 2014-07-31 LAB — CBC
HCT: 25.6 % — ABNORMAL LOW (ref 36.0–46.0)
Hemoglobin: 8.1 g/dL — ABNORMAL LOW (ref 12.0–15.0)
MCH: 32.1 pg (ref 26.0–34.0)
MCHC: 31.6 g/dL (ref 30.0–36.0)
MCV: 101.6 fL — AB (ref 78.0–100.0)
Platelets: 217 10*3/uL (ref 150–400)
RBC: 2.52 MIL/uL — AB (ref 3.87–5.11)
RDW: 22.5 % — ABNORMAL HIGH (ref 11.5–15.5)
WBC: 9.4 10*3/uL (ref 4.0–10.5)

## 2014-07-31 MED ORDER — CLONIDINE HCL 0.1 MG PO TABS
0.1000 mg | ORAL_TABLET | Freq: Once | ORAL | Status: DC
  Filled 2014-07-31: qty 1

## 2014-07-31 NOTE — Progress Notes (Signed)
UR completed.  Anticipate therapy evals to determine pt's best level of care at d/c.   Carlyle LipaMichelle Halla Chopp, RN BSN MHA CCM Trauma/Neuro ICU Case Manager [phone]

## 2014-07-31 NOTE — Progress Notes (Signed)
Stable overnight. Patient states that her headache is better. Nursing noted some speech difficulties overnight but none are apparent this morning.  Afebrile. Vitals are stable. She awakens easily. She converses appropriately. She has some very mild confusion regarding her orientation to place but otherwise is oriented. Speech is reasonably fluent. Cranial nerve function stable. Motor examination 5/5 bilaterally without pronator drift.  Follow-up head CT scan done this morning demonstrates stable to possibly very slightly enlarged left frontal subdural hematoma. Mass effect perhaps slightly worse but certainly not dangerous.  Overall stable. My preference as well as the patient's preference is to try to manage this nonoperatively if possible. Continue ICU observation.

## 2014-08-01 ENCOUNTER — Inpatient Hospital Stay (HOSPITAL_COMMUNITY): Payer: Medicare PPO | Admitting: Critical Care Medicine

## 2014-08-01 ENCOUNTER — Encounter (HOSPITAL_COMMUNITY): Payer: Self-pay | Admitting: Critical Care Medicine

## 2014-08-01 ENCOUNTER — Encounter (HOSPITAL_COMMUNITY)
Admission: EM | Disposition: A | Discharge status: Rehabilitation Facility | Payer: Self-pay | Source: Other Acute Inpatient Hospital | Attending: Neurosurgery

## 2014-08-01 ENCOUNTER — Inpatient Hospital Stay (HOSPITAL_COMMUNITY): Payer: Medicare PPO

## 2014-08-01 DIAGNOSIS — I6200 Nontraumatic subdural hemorrhage, unspecified: Secondary | ICD-10-CM

## 2014-08-01 DIAGNOSIS — S065X9A Traumatic subdural hemorrhage with loss of consciousness of unspecified duration, initial encounter: Secondary | ICD-10-CM

## 2014-08-01 DIAGNOSIS — J9601 Acute respiratory failure with hypoxia: Secondary | ICD-10-CM | POA: Insufficient documentation

## 2014-08-01 HISTORY — PX: CRANIOTOMY: SHX93

## 2014-08-01 LAB — CBC
HCT: 23.2 % — ABNORMAL LOW (ref 36.0–46.0)
Hemoglobin: 7.6 g/dL — ABNORMAL LOW (ref 12.0–15.0)
MCH: 32.3 pg (ref 26.0–34.0)
MCHC: 32.8 g/dL (ref 30.0–36.0)
MCV: 98.7 fL (ref 78.0–100.0)
PLATELETS: 239 10*3/uL (ref 150–400)
RBC: 2.35 MIL/uL — ABNORMAL LOW (ref 3.87–5.11)
RDW: 21.8 % — AB (ref 11.5–15.5)
WBC: 16.7 10*3/uL — ABNORMAL HIGH (ref 4.0–10.5)

## 2014-08-01 LAB — GLUCOSE, CAPILLARY: GLUCOSE-CAPILLARY: 147 mg/dL — AB (ref 70–99)

## 2014-08-01 LAB — BASIC METABOLIC PANEL
ANION GAP: 8 (ref 5–15)
BUN: 9 mg/dL (ref 6–23)
CALCIUM: 7.3 mg/dL — AB (ref 8.4–10.5)
CO2: 22 mmol/L (ref 19–32)
Chloride: 102 mmol/L (ref 96–112)
Creatinine, Ser: 0.65 mg/dL (ref 0.50–1.10)
GFR, EST NON AFRICAN AMERICAN: 79 mL/min — AB (ref >90)
GLUCOSE: 151 mg/dL — AB (ref 70–99)
Potassium: 4.1 mmol/L (ref 3.5–5.1)
SODIUM: 132 mmol/L — AB (ref 135–145)

## 2014-08-01 LAB — TRIGLYCERIDES: Triglycerides: 47 mg/dL (ref <150)

## 2014-08-01 LAB — PREPARE RBC (CROSSMATCH)

## 2014-08-01 LAB — ABO/RH: ABO/RH(D): A NEG

## 2014-08-01 SURGERY — CRANIOTOMY HEMATOMA EVACUATION SUBDURAL
Anesthesia: General

## 2014-08-01 MED ORDER — DEXAMETHASONE SODIUM PHOSPHATE 4 MG/ML IJ SOLN
4.0000 mg | Freq: Four times a day (QID) | INTRAMUSCULAR | Status: AC
  Administered 2014-08-02 – 2014-08-03 (×4): 4 mg via INTRAVENOUS
  Filled 2014-08-01 (×6): qty 1

## 2014-08-01 MED ORDER — LABETALOL HCL 5 MG/ML IV SOLN
INTRAVENOUS | Status: AC
  Filled 2014-08-01: qty 4

## 2014-08-01 MED ORDER — POTASSIUM CHLORIDE IN NACL 20-0.9 MEQ/L-% IV SOLN
INTRAVENOUS | Status: DC
  Administered 2014-08-01 – 2014-08-02 (×4): via INTRAVENOUS
  Administered 2014-08-03: 100 mL/h via INTRAVENOUS
  Administered 2014-08-03 – 2014-08-04 (×3): 1000 mL via INTRAVENOUS
  Administered 2014-08-04 – 2014-08-05 (×2): via INTRAVENOUS
  Administered 2014-08-05: 1000 mL via INTRAVENOUS
  Administered 2014-08-07: 22:00:00 via INTRAVENOUS
  Filled 2014-08-01 (×14): qty 1000

## 2014-08-01 MED ORDER — GLYCOPYRROLATE 0.2 MG/ML IJ SOLN
INTRAMUSCULAR | Status: DC | PRN
  Administered 2014-08-01: 0.6 mg via INTRAVENOUS

## 2014-08-01 MED ORDER — LIDOCAINE-EPINEPHRINE 1 %-1:100000 IJ SOLN
INTRAMUSCULAR | Status: DC | PRN
  Administered 2014-08-01: 16 mL

## 2014-08-01 MED ORDER — PANTOPRAZOLE SODIUM 40 MG IV SOLR
40.0000 mg | Freq: Every day | INTRAVENOUS | Status: DC
  Administered 2014-08-01 – 2014-08-05 (×5): 40 mg via INTRAVENOUS
  Filled 2014-08-01 (×5): qty 40

## 2014-08-01 MED ORDER — FENTANYL CITRATE (PF) 100 MCG/2ML IJ SOLN
100.0000 ug | INTRAMUSCULAR | Status: DC | PRN
  Administered 2014-08-06: 100 ug via INTRAVENOUS
  Filled 2014-08-01 (×4): qty 2

## 2014-08-01 MED ORDER — GLYCOPYRROLATE 0.2 MG/ML IJ SOLN
INTRAMUSCULAR | Status: AC
  Filled 2014-08-01: qty 1

## 2014-08-01 MED ORDER — CEFAZOLIN SODIUM-DEXTROSE 2-3 GM-% IV SOLR
INTRAVENOUS | Status: DC | PRN
  Administered 2014-08-01: 2 g via INTRAVENOUS

## 2014-08-01 MED ORDER — ACETAMINOPHEN 325 MG PO TABS
650.0000 mg | ORAL_TABLET | ORAL | Status: DC | PRN
  Administered 2014-08-08 – 2014-08-09 (×2): 650 mg via ORAL
  Filled 2014-08-01 (×2): qty 2

## 2014-08-01 MED ORDER — ONDANSETRON HCL 4 MG PO TABS: 4.0000 mg | ORAL_TABLET | ORAL | Status: DC | PRN

## 2014-08-01 MED ORDER — PHENYLEPHRINE HCL 10 MG/ML IJ SOLN
INTRAMUSCULAR | Status: AC
  Filled 2014-08-01: qty 1

## 2014-08-01 MED ORDER — CEFAZOLIN SODIUM-DEXTROSE 2-3 GM-% IV SOLR
2.0000 g | Freq: Three times a day (TID) | INTRAVENOUS | Status: AC
  Administered 2014-08-01 – 2014-08-02 (×2): 2 g via INTRAVENOUS
  Filled 2014-08-01 (×2): qty 50

## 2014-08-01 MED ORDER — PROMETHAZINE HCL 25 MG PO TABS: 12.5000 mg | ORAL_TABLET | ORAL | Status: DC | PRN

## 2014-08-01 MED ORDER — SODIUM CHLORIDE 0.9 % IV SOLN
INTRAVENOUS | Status: DC | PRN
Start: 2014-08-01 — End: 2014-08-01
  Administered 2014-08-01: 10:00:00 via INTRAVENOUS

## 2014-08-01 MED ORDER — NEOSTIGMINE METHYLSULFATE 10 MG/10ML IV SOLN
INTRAVENOUS | Status: AC
  Filled 2014-08-01: qty 1

## 2014-08-01 MED ORDER — GLYCOPYRROLATE 0.2 MG/ML IJ SOLN
INTRAMUSCULAR | Status: AC
  Filled 2014-08-01: qty 2

## 2014-08-01 MED ORDER — LIDOCAINE HCL (CARDIAC) 20 MG/ML IV SOLN
INTRAVENOUS | Status: DC | PRN
  Administered 2014-08-01: 100 mg via INTRAVENOUS

## 2014-08-01 MED ORDER — MANNITOL 20 % IV SOLN
INTRAVENOUS | Status: DC | PRN
  Administered 2014-08-01: 10:00:00 via INTRAVENOUS

## 2014-08-01 MED ORDER — DEXTROSE 5 % IV SOLN
10.0000 mg | INTRAVENOUS | Status: DC | PRN
  Administered 2014-08-01: 100 ug/min via INTRAVENOUS

## 2014-08-01 MED ORDER — CHLORHEXIDINE GLUCONATE 0.12 % MT SOLN
15.0000 mL | Freq: Two times a day (BID) | OROMUCOSAL | Status: DC
  Administered 2014-08-01 – 2014-08-07 (×12): 15 mL via OROMUCOSAL
  Filled 2014-08-01 (×11): qty 15

## 2014-08-01 MED ORDER — ACETAMINOPHEN 650 MG RE SUPP
650.0000 mg | RECTAL | Status: DC | PRN
  Administered 2014-08-01: 650 mg via RECTAL
  Filled 2014-08-01: qty 1

## 2014-08-01 MED ORDER — ONDANSETRON HCL 4 MG/2ML IJ SOLN
INTRAMUSCULAR | Status: AC
  Filled 2014-08-01: qty 2

## 2014-08-01 MED ORDER — NEOSTIGMINE METHYLSULFATE 10 MG/10ML IV SOLN
INTRAVENOUS | Status: DC | PRN
  Administered 2014-08-01: 4 mg via INTRAVENOUS

## 2014-08-01 MED ORDER — IPRATROPIUM-ALBUTEROL 0.5-2.5 (3) MG/3ML IN SOLN
3.0000 mL | Freq: Four times a day (QID) | RESPIRATORY_TRACT | Status: DC
  Administered 2014-08-01 – 2014-08-07 (×23): 3 mL via RESPIRATORY_TRACT
  Filled 2014-08-01 (×24): qty 3

## 2014-08-01 MED ORDER — PHENYLEPHRINE HCL 10 MG/ML IJ SOLN
INTRAMUSCULAR | Status: DC | PRN
  Administered 2014-08-01 (×2): 80 ug via INTRAVENOUS

## 2014-08-01 MED ORDER — DEXAMETHASONE SODIUM PHOSPHATE 10 MG/ML IJ SOLN
INTRAMUSCULAR | Status: AC
  Filled 2014-08-01: qty 1

## 2014-08-01 MED ORDER — NALOXONE HCL 0.4 MG/ML IJ SOLN: 0.0800 mg | INTRAMUSCULAR | Status: DC | PRN

## 2014-08-01 MED ORDER — ALBUTEROL SULFATE (2.5 MG/3ML) 0.083% IN NEBU
2.5000 mg | INHALATION_SOLUTION | RESPIRATORY_TRACT | Status: DC | PRN
  Administered 2014-08-02: 2.5 mg via RESPIRATORY_TRACT
  Filled 2014-08-01: qty 3

## 2014-08-01 MED ORDER — SODIUM CHLORIDE 0.9 % IR SOLN
Status: DC | PRN
  Administered 2014-08-01: 11:00:00

## 2014-08-01 MED ORDER — PROPOFOL 1000 MG/100ML IV EMUL: 0.0000 ug/kg/min | INTRAVENOUS | Status: DC

## 2014-08-01 MED ORDER — LABETALOL HCL 5 MG/ML IV SOLN
10.0000 mg | INTRAVENOUS | Status: DC | PRN
  Administered 2014-08-01 (×2): 10 mg via INTRAVENOUS
  Administered 2014-08-04: 20 mg via INTRAVENOUS
  Administered 2014-08-04: 30 mg via INTRAVENOUS
  Administered 2014-08-04 – 2014-08-05 (×2): 20 mg via INTRAVENOUS
  Filled 2014-08-01 (×2): qty 4
  Filled 2014-08-01: qty 8
  Filled 2014-08-01: qty 4

## 2014-08-01 MED ORDER — DEXAMETHASONE SODIUM PHOSPHATE 10 MG/ML IJ SOLN
6.0000 mg | Freq: Four times a day (QID) | INTRAMUSCULAR | Status: AC
  Administered 2014-08-01 – 2014-08-02 (×4): 6 mg via INTRAVENOUS
  Filled 2014-08-01: qty 0.6
  Filled 2014-08-01 (×3): qty 1

## 2014-08-01 MED ORDER — ROCURONIUM BROMIDE 100 MG/10ML IV SOLN
INTRAVENOUS | Status: DC | PRN
  Administered 2014-08-01: 40 mg via INTRAVENOUS

## 2014-08-01 MED ORDER — ONDANSETRON HCL 4 MG/2ML IJ SOLN
INTRAMUSCULAR | Status: DC | PRN
  Administered 2014-08-01: 4 mg via INTRAVENOUS

## 2014-08-01 MED ORDER — SURGIFOAM 100 EX MISC
CUTANEOUS | Status: DC | PRN
  Administered 2014-08-01: 11:00:00 via TOPICAL

## 2014-08-01 MED ORDER — SODIUM CHLORIDE 0.9 % IV SOLN: Freq: Once | INTRAVENOUS | Status: DC

## 2014-08-01 MED ORDER — CEFAZOLIN SODIUM-DEXTROSE 2-3 GM-% IV SOLR
INTRAVENOUS | Status: AC
  Filled 2014-08-01: qty 50

## 2014-08-01 MED ORDER — GLYCOPYRROLATE 0.2 MG/ML IJ SOLN
INTRAMUSCULAR | Status: AC
  Filled 2014-08-01: qty 3

## 2014-08-01 MED ORDER — FENTANYL CITRATE (PF) 100 MCG/2ML IJ SOLN
100.0000 ug | INTRAMUSCULAR | Status: DC | PRN
  Administered 2014-08-02 – 2014-08-06 (×18): 100 ug via INTRAVENOUS
  Filled 2014-08-01 (×14): qty 2

## 2014-08-01 MED ORDER — 0.9 % SODIUM CHLORIDE (POUR BTL) OPTIME
TOPICAL | Status: DC | PRN
  Administered 2014-08-01: 1000 mL

## 2014-08-01 MED ORDER — DEXAMETHASONE SODIUM PHOSPHATE 4 MG/ML IJ SOLN
4.0000 mg | Freq: Three times a day (TID) | INTRAMUSCULAR | Status: DC
  Administered 2014-08-03 – 2014-08-06 (×8): 4 mg via INTRAVENOUS
  Filled 2014-08-01 (×11): qty 1

## 2014-08-01 MED ORDER — DEXAMETHASONE SODIUM PHOSPHATE 10 MG/ML IJ SOLN
INTRAMUSCULAR | Status: DC | PRN
  Administered 2014-08-01: 10 mg via INTRAVENOUS

## 2014-08-01 MED ORDER — CETYLPYRIDINIUM CHLORIDE 0.05 % MT LIQD
7.0000 mL | Freq: Four times a day (QID) | OROMUCOSAL | Status: DC
  Administered 2014-08-01 – 2014-08-07 (×23): 7 mL via OROMUCOSAL

## 2014-08-01 MED ORDER — SODIUM CHLORIDE 0.9 % IV SOLN
500.0000 mg | Freq: Two times a day (BID) | INTRAVENOUS | Status: DC
  Administered 2014-08-01 – 2014-08-02 (×4): 500 mg via INTRAVENOUS
  Filled 2014-08-01 (×5): qty 5

## 2014-08-01 MED ORDER — LABETALOL HCL 5 MG/ML IV SOLN
INTRAVENOUS | Status: DC | PRN
  Administered 2014-08-01: 5 mL via INTRAVENOUS

## 2014-08-01 MED ORDER — FENTANYL CITRATE (PF) 100 MCG/2ML IJ SOLN
INTRAMUSCULAR | Status: DC | PRN
  Administered 2014-08-01: 150 ug via INTRAVENOUS
  Administered 2014-08-01 (×2): 50 ug via INTRAVENOUS

## 2014-08-01 MED ORDER — HYDROMORPHONE HCL 1 MG/ML IJ SOLN
0.5000 mg | INTRAMUSCULAR | Status: DC | PRN
  Administered 2014-08-01: 0.5 mg via INTRAVENOUS
  Filled 2014-08-01: qty 1

## 2014-08-01 MED ORDER — ONDANSETRON HCL 4 MG/2ML IJ SOLN: 4.0000 mg | INTRAMUSCULAR | Status: DC | PRN

## 2014-08-01 MED ORDER — FENTANYL CITRATE (PF) 250 MCG/5ML IJ SOLN
INTRAMUSCULAR | Status: AC
  Filled 2014-08-01: qty 5

## 2014-08-01 MED ORDER — PROPOFOL 10 MG/ML IV BOLUS
INTRAVENOUS | Status: DC | PRN
  Administered 2014-08-01: 50 mg via INTRAVENOUS

## 2014-08-01 MED ORDER — KETOROLAC TROMETHAMINE 30 MG/ML IJ SOLN
INTRAMUSCULAR | Status: AC
  Filled 2014-08-01: qty 1

## 2014-08-01 SURGICAL SUPPLY — 67 items
BAG DECANTER FOR FLEXI CONT (MISCELLANEOUS) ×3 IMPLANT
BANDAGE GAUZE 4  KLING STR (GAUZE/BANDAGES/DRESSINGS) IMPLANT
BIT DRILL WIRE PASS 1.3MM (BIT) ×1 IMPLANT
BLADE SURG 11 STRL SS (BLADE) IMPLANT
BNDG COHESIVE 4X5 TAN NS LF (GAUZE/BANDAGES/DRESSINGS) IMPLANT
BRUSH SCRUB EZ 1% IODOPHOR (MISCELLANEOUS) IMPLANT
BRUSH SCRUB EZ PLAIN DRY (MISCELLANEOUS) ×3 IMPLANT
BUR ACORN 6.0 PRECISION (BURR) ×2 IMPLANT
BUR ACORN 6.0MM PRECISION (BURR) ×1
BUR ROUTER D-58 CRANI (BURR) IMPLANT
CANISTER SUCT 3000ML PPV (MISCELLANEOUS) ×3 IMPLANT
CLIP TI MEDIUM 6 (CLIP) IMPLANT
CONT SPEC 4OZ CLIKSEAL STRL BL (MISCELLANEOUS) ×3 IMPLANT
COVER BACK TABLE 60X90IN (DRAPES) ×3 IMPLANT
DRAIN CHANNEL 10M FLAT 3/4 FLT (DRAIN) IMPLANT
DRAIN JACKSON PRATT 10MM FLAT (MISCELLANEOUS) ×3 IMPLANT
DRAPE MICROSCOPE LEICA (MISCELLANEOUS) IMPLANT
DRAPE NEUROLOGICAL W/INCISE (DRAPES) ×3 IMPLANT
DRAPE SURG 17X23 STRL (DRAPES) IMPLANT
DRAPE WARM FLUID 44X44 (DRAPE) ×3 IMPLANT
DRILL WIRE PASS 1.3MM (BIT) ×3
DRSG TELFA 3X8 NADH (GAUZE/BANDAGES/DRESSINGS) IMPLANT
ELECT CAUTERY BLADE 6.4 (BLADE) ×3 IMPLANT
ELECT REM PT RETURN 9FT ADLT (ELECTROSURGICAL) ×3
ELECTRODE REM PT RTRN 9FT ADLT (ELECTROSURGICAL) ×1 IMPLANT
EVACUATOR SILICONE 100CC (DRAIN) ×3 IMPLANT
GAUZE SPONGE 4X4 12PLY STRL (GAUZE/BANDAGES/DRESSINGS) ×3 IMPLANT
GAUZE SPONGE 4X4 16PLY XRAY LF (GAUZE/BANDAGES/DRESSINGS) IMPLANT
GLOVE ECLIPSE 9.0 STRL (GLOVE) ×3 IMPLANT
GLOVE EXAM NITRILE LRG STRL (GLOVE) IMPLANT
GLOVE EXAM NITRILE MD LF STRL (GLOVE) IMPLANT
GLOVE EXAM NITRILE XL STR (GLOVE) IMPLANT
GLOVE EXAM NITRILE XS STR PU (GLOVE) IMPLANT
GOWN STRL REUS W/ TWL LRG LVL3 (GOWN DISPOSABLE) IMPLANT
GOWN STRL REUS W/ TWL XL LVL3 (GOWN DISPOSABLE) IMPLANT
GOWN STRL REUS W/TWL 2XL LVL3 (GOWN DISPOSABLE) IMPLANT
GOWN STRL REUS W/TWL LRG LVL3 (GOWN DISPOSABLE)
GOWN STRL REUS W/TWL XL LVL3 (GOWN DISPOSABLE)
HEMOSTAT SURGICEL 2X14 (HEMOSTASIS) ×3 IMPLANT
KIT BASIN OR (CUSTOM PROCEDURE TRAY) ×3 IMPLANT
KIT ROOM TURNOVER OR (KITS) ×3 IMPLANT
LIQUID BAND (GAUZE/BANDAGES/DRESSINGS) IMPLANT
NEEDLE HYPO 25X1 1.5 SAFETY (NEEDLE) ×3 IMPLANT
NS IRRIG 1000ML POUR BTL (IV SOLUTION) ×9 IMPLANT
PACK CRANIOTOMY (CUSTOM PROCEDURE TRAY) ×3 IMPLANT
PAD ARMBOARD 7.5X6 YLW CONV (MISCELLANEOUS) ×3 IMPLANT
PAD EYE OVAL STERILE LF (GAUZE/BANDAGES/DRESSINGS) IMPLANT
PATTIES SURGICAL .25X.25 (GAUZE/BANDAGES/DRESSINGS) IMPLANT
PATTIES SURGICAL .5 X.5 (GAUZE/BANDAGES/DRESSINGS) IMPLANT
PATTIES SURGICAL .5 X3 (DISPOSABLE) IMPLANT
PATTIES SURGICAL 1X1 (DISPOSABLE) IMPLANT
PIN MAYFIELD SKULL DISP (PIN) IMPLANT
PLATE 1.5 4HOLE LONG STRAIGHT (Plate) ×9 IMPLANT
SCREW SELF DRILL HT 1.5/4MM (Screw) ×18 IMPLANT
SPECIMEN JAR SMALL (MISCELLANEOUS) IMPLANT
SPONGE LAP 18X18 X RAY DECT (DISPOSABLE) IMPLANT
SPONGE NEURO XRAY DETECT 1X3 (DISPOSABLE) IMPLANT
SPONGE SURGIFOAM ABS GEL 100 (HEMOSTASIS) ×3 IMPLANT
STAPLER VISISTAT 35W (STAPLE) ×3 IMPLANT
SUT NURALON 4 0 TR CR/8 (SUTURE) ×6 IMPLANT
SUT VIC AB 2-0 CT2 18 VCP726D (SUTURE) ×9 IMPLANT
SYR CONTROL 10ML LL (SYRINGE) ×3 IMPLANT
TOWEL OR 17X24 6PK STRL BLUE (TOWEL DISPOSABLE) ×3 IMPLANT
TOWEL OR 17X26 10 PK STRL BLUE (TOWEL DISPOSABLE) ×3 IMPLANT
TRAY FOLEY CATH 14FRSI W/METER (CATHETERS) ×3 IMPLANT
UNDERPAD 30X30 INCONTINENT (UNDERPADS AND DIAPERS) IMPLANT
WATER STERILE IRR 1000ML POUR (IV SOLUTION) ×3 IMPLANT

## 2014-08-01 NOTE — Progress Notes (Signed)
Patient with an overall decline overnight. No longer verbal. Less movement with right upper extremity. She will awaken she moans and says a few words but no longer follows commands reliably.  Situation worrisome for expansion of her left frontal subdural hematoma. Plan head CT scan this morning. Depending on scan likely proceed to operating room for left craniotomy and evacuation of subdural hematoma.

## 2014-08-01 NOTE — Clinical Documentation Improvement (Signed)
Presents with traumatic SDH.  CT of Head on 4/19 reveals "enlarging left holohemispheric acute SDH with a 7mm left - to - right midline shift"  Please clarify with a diagnosis what this shift means if clinically significant. Please document your findings in next progress note and include in discharge summary if applicable.  _______Other Condition__________________ _______Cannot Clinically Determine   Thank You, Shellee MiloEileen T Rylyn Zawistowski ,RN Clinical Documentation Specialist:  [phone]  [facility]- [facility] Information Management

## 2014-08-01 NOTE — Op Note (Signed)
Date of procedure: 08/01/2014  Date of dictation: Same  Service: Neurosurgery  Preoperative diagnosis: Posttraumatic left subdural hematoma  Postoperative diagnosis: Same  Procedure Name: Left frontotemporal craniotomy with evacuation of subdural hematoma, placement of subdural drain.  Surgeon:Lus Kriegel A.Antero Derosia, M.D.  Asst. Surgeon: None  Anesthesia: General  Indication: 81 year old woman status post mechanical fall with resultant head injury and known left-sided subdural. Initially the patient was doing well clinically and decision was made to observe her in hopes that the clot could be allowed to liquefy and potentially being treated with burr hole evacuation alone. The patient's clinical situation deteriorated overnight. She presents now for emergent craniotomy and evacuation of hematoma.  Operative note: After induction of anesthesia,, patient positioned supine with her head turned toward the right. Patient's left frontotemporal scalp prepped and draped sterilely. Curvilinear incision was made from her zygoma to her left frontal region near midline. This carried down sharply to the paracranium. Temporalis muscle was divided and the scalp and temporalis muscle Were mobilized and retracted anteriorly. Left-sided frontotemporal craniotomy was performed using high-speed drill. Bone flap was elevated. The dura itself was lacerated and a couple places. Hemostasis was achieved. The underlying brain was not injured. A Metzenbaum scissors was used to divide the dura further and was flapped along the floor of the anterior and middle fossa. An large left frontal subdural hematoma was encountered. This is dissected free and removed in large pieces. There is no evidence of active hemorrhage. There was a small area of cortical injury beneath the hematoma likely the source of the bleeding. The subdural spaces inspected and all elements of the subdural hematoma were removed. The subdural spaces irrigated. No  further bleeding was observed. The dura was reapproximated loosely. A 10 mm JP drain was left in the upper and the subdural space. Skull was reattached using OsteoMed plates. Scalp reapproximated using 2-0 Vicryl sutures. Staples were applied to the surface. No apparent complications. Patient tolerated the procedure well and she returns to the recovery room postop.

## 2014-08-01 NOTE — Anesthesia Postprocedure Evaluation (Signed)
Anesthesia Post Note  Patient: Jaime [name]  Procedure(s) Performed: Procedure(s) (LRB): CRANIOTOMY HEMATOMA EVACUATION SUBDURAL (N/A)  Anesthesia type: General  Patient location: PACU  Post pain: Pain level controlled  Post assessment: Post-op Vital signs reviewed  Last Vitals: BP 125/53 mmHg  Pulse 70  Temp(Src) 37.3 C (Axillary)  Resp 17  Ht 5\' 2"  (1.575 m)  Wt 197 lb 1.5 oz (89.4 kg)  BMI 36.04 kg/m2  SpO2 99%  Post vital signs: Reviewed  Level of consciousness: Unresponsive, intubated. Husband at bedside.  Complications: No apparent anesthesia complications

## 2014-08-01 NOTE — Brief Op Note (Signed)
07/30/2014 - 08/01/2014  11:13 AM  PATIENT:  Jaime SpittlePatricia S [name]  81 y.o. female  PRE-OPERATIVE DIAGNOSIS:  Subdural Hematoma  POST-OPERATIVE DIAGNOSIS:  SDH  PROCEDURE:  Procedure(s): CRANIOTOMY HEMATOMA EVACUATION SUBDURAL (N/A)  SURGEON:  Surgeon(s) and Role:    * Julio SicksHenry Treyana Sturgell, MD - Primary  PHYSICIAN ASSISTANT:   ASSISTANTS: none   ANESTHESIA:   general  EBL:  Total I/O In: 1750 [I.V.:1450; IV Piggyback:300] Out: 600 [Urine:500; Blood:100]  BLOOD ADMINISTERED:none  DRAINS: (10 mm) Jackson-Pratt drain(s) with closed bulb suction in the subdural space   LOCAL MEDICATIONS USED:  LIDOCAINE   SPECIMEN:  No Specimen  DISPOSITION OF SPECIMEN:  N/A  COUNTS:  YES  TOURNIQUET:  * No tourniquets in log *  DICTATION: .Dragon Dictation  PLAN OF CARE: Admit to inpatient   PATIENT DISPOSITION:  ICU - intubated and hemodynamically stable.   Delay start of Pharmacological VTE agent (>24hrs) due to surgical blood loss or risk of bleeding: yes

## 2014-08-01 NOTE — Anesthesia Procedure Notes (Signed)
Procedure Name: Intubation Date/Time: 08/01/2014 9:56 AM Performed by: Glo HerringLEE, Jury Caserta B Pre-anesthesia Checklist: Patient identified, Timeout performed, Emergency Drugs available, Suction available and Patient being monitored Patient Re-evaluated:Patient Re-evaluated prior to inductionOxygen Delivery Method: Circle system utilized Preoxygenation: Pre-oxygenation with 100% oxygen Intubation Type: IV induction Ventilation: Mask ventilation without difficulty Laryngoscope Size: Mac and 3 Grade View: Grade II Tube type: Subglottic suction tube Tube size: 7.0 mm Number of attempts: 1 Airway Equipment and Method: Stylet Placement Confirmation: CO2 detector,  positive ETCO2,  ETT inserted through vocal cords under direct vision and breath sounds checked- equal and bilateral Secured at: 21 cm Tube secured with: Tape Dental Injury: Teeth and Oropharynx as per pre-operative assessment

## 2014-08-01 NOTE — Transfer of Care (Signed)
Immediate Anesthesia Transfer of Care Note  Patient: Jaime [name]  Procedure(s) Performed: Procedure(s): CRANIOTOMY HEMATOMA EVACUATION SUBDURAL (N/A)  Patient Location: ICU  Anesthesia Type:General  Level of Consciousness: unresponsive  Airway & Oxygen Therapy: Patient Spontanous Breathing and Patient remains intubated per anesthesia plan  Post-op Assessment: Report given to RN and Post -op Vital signs reviewed and stable  Post vital signs: Reviewed and stable  Last Vitals:  Filed Vitals:   08/01/14 0900  BP: 158/65  Pulse: 99  Temp:   Resp: 20    Complications: No apparent anesthesia complications

## 2014-08-01 NOTE — Progress Notes (Signed)
Dr. Jordan LikesPool at bedside, JP drain unable to keep charge and drainage coming from surgery site. Per Dr. Jordan LikesPool, ok to remain draining by gravity and reinforce dressing as needed. MD also aware of neuro status at this time, pt localizing with L arm, moving lower extremities to pain and not moving R arm.   Holly Bodilyulbertson, Bethany Leigh

## 2014-08-01 NOTE — Anesthesia Preprocedure Evaluation (Addendum)
Anesthesia Evaluation  Patient identified by MRN, date of birth, ID band Patient awake    Reviewed: Allergy & Precautions, H&P , NPO status , Patient's Chart, lab work & pertinent test results  Airway Mallampati: II  TM Distance: <3 FB Neck ROM: Full    Dental  (+) Teeth Intact, Dental Advisory Given   Pulmonary former smoker,  breath sounds clear to auscultation  Pulmonary exam normal       Cardiovascular negative cardio ROS  Rhythm:Regular Rate:Normal     Neuro/Psych Patient is blind; hx compression fracture lower back negative neurological ROS  negative psych ROS   GI/Hepatic Neg liver ROS, hiatal hernia, GERD-  Medicated and Controlled,  Endo/Other  negative endocrine ROS  Renal/GU negative Renal ROS     Musculoskeletal  (+) Arthritis -,   Abdominal   Peds  Hematology negative hematology ROS (+) anemia ,   Anesthesia Other Findings   Reproductive/Obstetrics negative OB ROS                            Anesthesia Physical  Anesthesia Plan  ASA: IV and emergent  Anesthesia Plan: General   Post-op Pain Management:    Induction: Intravenous  Airway Management Planned: Oral ETT  Additional Equipment: Arterial line  Intra-op Plan:   Post-operative Plan: Possible Post-op intubation/ventilation  Informed Consent: I have reviewed the patients History and Physical, chart, labs and discussed the procedure including the risks, benefits and alternatives for the proposed anesthesia with the patient or authorized representative who has indicated his/her understanding and acceptance.   Dental advisory given  Plan Discussed with: CRNA  Anesthesia Plan Comments:         Anesthesia Quick Evaluation
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
## 2014-08-02 ENCOUNTER — Inpatient Hospital Stay (HOSPITAL_COMMUNITY): Payer: Medicare PPO

## 2014-08-02 ENCOUNTER — Encounter (HOSPITAL_COMMUNITY): Payer: Self-pay

## 2014-08-02 LAB — CBC
HEMATOCRIT: 20.5 % — AB (ref 36.0–46.0)
HEMOGLOBIN: 6.6 g/dL — AB (ref 12.0–15.0)
MCH: 32 pg (ref 26.0–34.0)
MCHC: 32.2 g/dL (ref 30.0–36.0)
MCV: 99.5 fL (ref 78.0–100.0)
Platelets: 218 10*3/uL (ref 150–400)
RBC: 2.06 MIL/uL — ABNORMAL LOW (ref 3.87–5.11)
RDW: 21.9 % — ABNORMAL HIGH (ref 11.5–15.5)
WBC: 11 10*3/uL — ABNORMAL HIGH (ref 4.0–10.5)

## 2014-08-02 LAB — GLUCOSE, CAPILLARY
GLUCOSE-CAPILLARY: 137 mg/dL — AB (ref 70–99)
Glucose-Capillary: 159 mg/dL — ABNORMAL HIGH (ref 70–99)
Glucose-Capillary: 158 mg/dL — ABNORMAL HIGH (ref 70–99)
Glucose-Capillary: 141 mg/dL — ABNORMAL HIGH (ref 70–99)
Glucose-Capillary: 131 mg/dL — ABNORMAL HIGH (ref 70–99)

## 2014-08-02 LAB — BASIC METABOLIC PANEL
Anion gap: 7 (ref 5–15)
BUN: 18 mg/dL (ref 6–23)
CHLORIDE: 108 mmol/L (ref 96–112)
CO2: 22 mmol/L (ref 19–32)
CREATININE: 0.55 mg/dL (ref 0.50–1.10)
Calcium: 8.1 mg/dL — ABNORMAL LOW (ref 8.4–10.5)
GFR calc Af Amer: >90 mL/min (ref >90)
GFR calc non Af Amer: 84 mL/min — ABNORMAL LOW (ref >90)
Glucose, Bld: 146 mg/dL — ABNORMAL HIGH (ref 70–99)
POTASSIUM: 4.1 mmol/L (ref 3.5–5.1)
Sodium: 137 mmol/L (ref 135–145)

## 2014-08-02 LAB — POCT I-STAT 3, ART BLOOD GAS (G3+)
ACID-BASE DEFICIT: 3 mmol/L — AB (ref 0.0–2.0)
BICARBONATE: 21.3 meq/L (ref 20.0–24.0)
O2 SAT: 97 %
Patient temperature: 98.6
TCO2: 22 mmol/L (ref 0–100)
pCO2 arterial: 35.5 mmHg (ref 35.0–45.0)
pH, Arterial: 7.387 (ref 7.350–7.450)
pO2, Arterial: 95 mmHg (ref 80.0–100.0)

## 2014-08-02 MED ORDER — VITAL HIGH PROTEIN PO LIQD
1000.0000 mL | ORAL | Status: DC
  Administered 2014-08-05 – 2014-08-06 (×2): 1000 mL
  Filled 2014-08-02 (×8): qty 1000

## 2014-08-02 MED ORDER — LORAZEPAM 2 MG/ML IJ SOLN
2.0000 mg | INTRAMUSCULAR | Status: DC | PRN
Start: 2014-08-02 — End: 2014-08-09
  Administered 2014-08-02 (×3): 2 mg via INTRAVENOUS
  Filled 2014-08-02 (×2): qty 1

## 2014-08-02 MED ORDER — LORAZEPAM 2 MG/ML IJ SOLN
INTRAMUSCULAR | Status: AC
  Filled 2014-08-02: qty 1

## 2014-08-02 MED ORDER — PRO-STAT SUGAR FREE PO LIQD
60.0000 mL | Freq: Every day | ORAL | Status: DC
  Administered 2014-08-05 – 2014-08-07 (×9): 60 mL
  Filled 2014-08-02 (×34): qty 60

## 2014-08-02 MED ORDER — LEVETIRACETAM 500 MG/5ML IV SOLN
500.0000 mg | Freq: Once | INTRAVENOUS | Status: AC
  Administered 2014-08-02: 500 mg via INTRAVENOUS
  Filled 2014-08-02: qty 5

## 2014-08-02 MED ORDER — SODIUM CHLORIDE 0.9 % IV SOLN
Freq: Once | INTRAVENOUS | Status: AC
  Administered 2014-08-02: 19:00:00 via INTRAVENOUS

## 2014-08-02 MED ORDER — SODIUM CHLORIDE 0.9 % IV SOLN
1000.0000 mg | Freq: Once | INTRAVENOUS | Status: AC
  Administered 2014-08-02: 1000 mg via INTRAVENOUS
  Filled 2014-08-02: qty 10

## 2014-08-02 NOTE — Progress Notes (Signed)
PULMONARY / CRITICAL CARE MEDICINE   Name: Jaime Spittleatricia S States MRN: [id number] DOB: 1927-05-14    ADMISSION DATE:  07/30/2014 CONSULTATION DATE:  08/02/2014  REFERRING MD :  Jordan LikesPool  CHIEF COMPLAINT:  VDRF  INITIAL PRESENTATION:  81 [facility].o. F [facility] with headache after a mechanical fall.  She was found to have a left sided SDH and [facility]  Overnight 4/20, she decompensated so on AM of 4/21, was taken to OR for craniotomy and evacuation of SDH.  She returned to ICU on the vent and PCCM was consulted for vent management.   STUDIES:  CT Head 4/20 >>> enlarging left SDH measuring up to 2.2cm with 7mm L to R MLS.  Small amount of left sylvian fissure subarachnoid blood.  SIGNIFICANT EVENTS: 4/19 - admit 4/20 - decompensated overnight 4/21 - to OR for left frontotemporal craniotomy with evacuation of SDH and placement of subdural drain.  Returned to ICU on vent, PCCM consulted 4/22 - sizure x2  HISTORY OF PRESENT ILLNESS:  Pt is encephalopathic; therefore, this HPI is obtained from chart review. Jaime [name] is a 81 y.o. F with PMH as outlined below.  [facility] with headache after a mechanical fall.  It was unknown whether she had any LOC.  She was found to have a left sided SDH and [facility] for further evaluation and management.  She was seen by neurosurgery (Dr. Jordan LikesPool) and initial plans were to observe her closely in hopes that the clot would liquify and be able to be be treated with burr hole evacuation alone.  Unfortunately on night of 4/20, she decompensated to the point where she became non-verbal with less movement of her RUE.  On AM of 4/21, decision was made to take her to the OR for left frontotemporal craniotomy with evacuation of SDH and placement of subdural drain.  Following OR, she returned to the ICU and remained on the ventilator.  PCCM was consulted for vent management.  SUBJECTIVE: seizure overnight  x2.  VITAL SIGNS: Temp:  [97.7 F (36.5 C)-101.1 F (38.4 C)] 97.7 F (36.5 C) (04/22 0800) Pulse Rate:  [62-98] 76 (04/22 0900) Resp:  [14-22] 15 (04/22 0900) BP: (102-147)/(48-86) 118/76 mmHg (04/22 0900) SpO2:  [96 %-100 %] 98 % (04/22 0900) Arterial Line BP: (118-189)/(46-64) 133/57 mmHg (04/22 0900) FiO2 (%):  [40 %] 40 % (04/22 0900)   HEMODYNAMICS:   VENTILATOR SETTINGS: Vent Mode:  [-] PRVC FiO2 (%):  [40 %] 40 % Set Rate:  [14 bmp] 14 bmp Vt Set:  [440 mL] 440 mL PEEP:  [5 cmH20] 5 cmH20 Pressure Support:  [10 cmH20] 10 cmH20 Plateau Pressure:  [13 cmH20-17 cmH20] 16 cmH20  INTAKE / OUTPUT: Intake/Output      04/21 0701 - 04/22 0700 04/22 0701 - 04/23 0700   P.O.     I.V. (mL/kg) 3271.7 (36.6) 200 (2.2)   IV Piggyback 665    Total Intake(mL/kg) 3936.7 (44) 200 (2.2)   Urine (mL/kg/hr) 1665 (0.8)    Drains 90 (0)    Stool 0 (0)    Blood 100 (0)    Total Output 1855     Net +2081.7 +200        Stool Occurrence 1 x     PHYSICAL EXAMINATION: General: WDWN female, in NAD. Neuro: Sedated, withdraws to pain. HEENT: [place]/AT. Artificial eyes, sclerae anicteric. Cardiovascular: RRR, no M/R/G.  Lungs: Coarse bilaterally. Abdomen: BS  x 4, soft, NT/ND.  Musculoskeletal: No gross deformities, no edema.  Skin: Intact, warm, no rashes.  LABS:  CBC  Recent Labs Lab 07/31/14 0218 08/01/14 1345  WBC 9.4 16.7*  HGB 8.1* 7.6*  HCT 25.6* 23.2*  PLT 217 239   Coag's No results for input(s): APTT, INR in the last 168 hours. BMET  Recent Labs Lab 07/31/14 0218 08/01/14 1345  NA 137 132*  K 4.0 4.1  CL 105 102  CO2 27 22  BUN 13 9  CREATININE 0.55 0.65  GLUCOSE 93 151*   Electrolytes  Recent Labs Lab 07/31/14 0218 08/01/14 1345  CALCIUM 8.2* 7.3*   Sepsis Markers No results for input(s): LATICACIDVEN, PROCALCITON, O2SATVEN in the last 168 hours.  ABG No results for input(s): PHART, PCO2ART, PO2ART in the last 168 hours.  Liver Enzymes No  results for input(s): AST, ALT, ALKPHOS, BILITOT, ALBUMIN in the last 168 hours.  Cardiac Enzymes No results for input(s): TROPONINI, PROBNP in the last 168 hours.  Glucose  Recent Labs Lab 08/01/14 2327 08/02/14 0342 08/02/14 0804  GLUCAP 147* 159* 158*   Imaging Dg Chest 1 View  08/01/2014   CLINICAL DATA:  Respiratory failure, shortness of breath.  EXAM: CHEST  1 VIEW  COMPARISON:  Portable chest x-ray dated July 30, 2014  FINDINGS: The lungs are mildly hypoinflated. The endotracheal tube tip lies 2.7 cm above the carina. The cardiopericardial silhouette is mildly enlarged. The central pulmonary vascularity is prominent. The left hemidiaphragm remains obscured. There is a known large left-sided hiatal hernia/partially intrathoracic stomach which is not visible today.  IMPRESSION: Persistent increased density in the left retrocardiac region which may reflect the presence of pneumonia. However, the patient has a known large hiatal hernia which may be contributing to the findings. Low-grade CHF is suspected as well.   Electronically Signed   By: David  [place] M.D.   On: 08/01/2014 15:44   ASSESSMENT / PLAN:  NEUROLOGIC A:   Acute metabolic encephalopathy Post traumatic left SDH - s/p left frontotemporal craniotomy with evacuation of SDH and placement of subdural drain Two seizures overnight P:   Sedation:  All d/ced. RASS goal: 0 to -1. Daily WUA. Post op care per neurosurgery. Hold outpatient alprazolam. Keppra  PULMONARY OETT 4/21 >>> A: VDRF following craniotomy P:   Begin PS weans but no extubation VAP bundle. SBT in AM. DuoNebs / Albuterol. ABG and CXR in AM.  CARDIOVASCULAR A:  Strict BP control, goal SBP < 160 P:  Labetalol PRN.  RENAL A:   No acute issues P:   BMP in AM. Replace electrolytes as indicated.  GASTROINTESTINAL A:   GERD Nutrition P:   SUP: Pantoprazole. Consult nutrition for TF  HEMATOLOGIC A:   Anemia VTE Prophylaxis P:   Transfuse for Hgb < 7. SCD's. CBC in AM. Hold outpatient xarelto (? Reason taking).  INFECTIOUS A:   No indication for infection P:   Monitor clinically.  ENDOCRINE A:   Anticipate steroid induced hyperglycemia P:   SSI if glucose consistently > 180.  Family updated: Husband at bedside.  The patient is critically ill with multiple organ systems failure and requires high complexity decision making for assessment and support, frequent evaluation and titration of therapies, application of advanced monitoring technologies and extensive interpretation of multiple databases.   Critical Care Time devoted to patient care services described in this note is  35  Minutes. This time reflects time of care of this signee Dr Koren Bound. This  critical care time does not reflect procedure time, or teaching time or supervisory time of PA/NP/Med student/Med Resident etc but could involve care discussion time.  Alyson Reedy, M.D. [facility] Pulmonary/Critical Care Medicine. Pager: [phone]. After hours pager: [phone].  08/02/2014, 10:24 AM

## 2014-08-02 NOTE — Progress Notes (Signed)
HGB of 6.6 told to Dr Jeral FruitBotero, he gave orders to transfuse 2 PRBC's. Ordered placed. Family updated

## 2014-08-02 NOTE — Progress Notes (Signed)
[facility] to place [facility]/NG after 5 different attempts by 3 RNs. He instructed to not reinsert at this time. Family at bedside made aware.

## 2014-08-02 NOTE — Progress Notes (Signed)
eLink Physician-Brief Progress Note Patient Name: Shaune Spittleatricia S Font DOB: Aug 31, 1927 MRN: [id number]   Date of Service  08/02/2014  HPI/Events of Note  Seizre X 2. First stopped spontaneously. Lorazepam given for second  eICU Interventions  An additional 500 mg Keppra has been ordered     Intervention Category Major Interventions: Seizures - evaluation and management  Billy FischerDavid Simonds 08/02/2014, 5:27 AM
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
## 2014-08-02 NOTE — Progress Notes (Signed)
Pt has 2 short post-op GTC szs. She's post-ictal but withdraws legs and flexes LUE. Head CT looks good to me. Keppra given last night at 10 pm. Dose of 500 mg being given now.

## 2014-08-02 NOTE — Progress Notes (Signed)
   08/02/14 2021  Vent Select  Invasive or Noninvasive Invasive  Adult Vent Y  Airway 7 mm  Placement Date/Time: 08/01/14 (c) 0956   Grade View: Grade 2  Airway Device: Endotracheal Tube  Laryngoscope Blade: MAC;3  ETT Types: Subglottic  Size (mm): 7 mm  Cuffed: Cuffed;Min.occ.pres.  Insertion attempts: 1  Airway Equipment: Stylet  Placement...  Secured at (cm) 22 cm  Measured From Lips  Secured Location Center  Secured By [employer] Tube Holder  Site Condition Dry  Adult Ventilator Settings  Vent Type Servo i  Humidity HME  Vent Mode PSV  FiO2 (%) 40 %  Pressure Support 10 cmH20  PEEP 5 cmH20  Adult Ventilator Measurements  Peak Airway Pressure 16 L/min  Mean Airway Pressure 9 cmH20  Resp Rate Total 17 br/min  Exhaled Vt 697 mL  Measured Ve 12.2 mL  Patient placed back on PSV due to asynchrony with the vent when on PRVC. She is now resting comfortably and will remain on PSV as tolerated.

## 2014-08-02 NOTE — Progress Notes (Signed)
[id number]- Patient had a 30 second generalized "jerking" seizure  upon returning from routine f/u CT scan. HR spiked to 140s and patient became very dyssynchronous with vent. ELINK notified and camera'd in room, but patient had spontaneously stopped on her own and returned back to baseline (localize L arm and w/d BLE). Notified Dr Yetta BarreJones, patient already on keppra BID so MD gave no new orders.   [id number]- patient had second generalized seizure activity for approx 45 seconds. Dr Sung AmabileSimonds camera'd in room and ordered ativan 2mg  to be given.  Ativan given & seizure immediately stopped. New order for additional dose of keppra to be given. Will continue to monitor.   Ory Elting, [place] Marie, RN 08/02/2014 5:51 AM

## 2014-08-02 NOTE — Progress Notes (Signed)
Postop day 1. Overall stable last night. No hemodynamic instability. Urine output good. Patient remains on ventilator.  Patient awakens to stimulation. Purposeful strongly with her left upper extremity area and moves both lower extremities well. Flexes her right upper extremity semi-purposefully. Does not follow commands. Drainage around her drain site.  Follow-up head CT scan demonstrates complete resolution of the patient's previous subdural hematoma with marked improvement of her mass effect. No evidence of stroke or other problems. Drain removed at bedside today.  Overall doing reasonably well following craniotomy and evacuation of subdural hematoma. Plan to wean to extubation.

## 2014-08-03 ENCOUNTER — Inpatient Hospital Stay (HOSPITAL_COMMUNITY): Payer: Medicare PPO

## 2014-08-03 DIAGNOSIS — R561 Post traumatic seizures: Secondary | ICD-10-CM

## 2014-08-03 DIAGNOSIS — J9600 Acute respiratory failure, unspecified whether with hypoxia or hypercapnia: Secondary | ICD-10-CM | POA: Insufficient documentation

## 2014-08-03 LAB — APTT: aPTT: 34 seconds (ref 24–37)

## 2014-08-03 LAB — GLUCOSE, CAPILLARY
GLUCOSE-CAPILLARY: 129 mg/dL — AB (ref 70–99)
GLUCOSE-CAPILLARY: 131 mg/dL — AB (ref 70–99)
Glucose-Capillary: 128 mg/dL — ABNORMAL HIGH (ref 70–99)
Glucose-Capillary: 135 mg/dL — ABNORMAL HIGH (ref 70–99)
Glucose-Capillary: 141 mg/dL — ABNORMAL HIGH (ref 70–99)
Glucose-Capillary: 177 mg/dL — ABNORMAL HIGH (ref 70–99)

## 2014-08-03 LAB — BASIC METABOLIC PANEL
Anion gap: 9 (ref 5–15)
BUN: 18 mg/dL (ref 6–23)
CHLORIDE: 109 mmol/L (ref 96–112)
CO2: 22 mmol/L (ref 19–32)
Calcium: 8.2 mg/dL — ABNORMAL LOW (ref 8.4–10.5)
Creatinine, Ser: 0.49 mg/dL — ABNORMAL LOW (ref 0.50–1.10)
GFR calc Af Amer: >90 mL/min (ref >90)
GFR calc non Af Amer: 87 mL/min — ABNORMAL LOW (ref >90)
GLUCOSE: 132 mg/dL — AB (ref 70–99)
Potassium: 4.3 mmol/L (ref 3.5–5.1)
Sodium: 140 mmol/L (ref 135–145)

## 2014-08-03 LAB — BLOOD GAS, ARTERIAL
ACID-BASE DEFICIT: 2.2 mmol/L — AB (ref 0.0–2.0)
Bicarbonate: 21.7 mEq/L (ref 20.0–24.0)
DRAWN BY: 29017
FIO2: 0.4 %
O2 SAT: 97.6 %
PATIENT TEMPERATURE: 98.6
PEEP: 5 cmH2O
PO2 ART: 107 mmHg — AB (ref 80.0–100.0)
PRESSURE SUPPORT: 10 cmH2O
TCO2: 22.8 mmol/L (ref 0–100)
pCO2 arterial: 34.6 mmHg — ABNORMAL LOW (ref 35.0–45.0)
pH, Arterial: 7.413 (ref 7.350–7.450)

## 2014-08-03 LAB — PROTIME-INR
INR: 1.2 (ref 0.00–1.49)
Prothrombin Time: 15.3 seconds — ABNORMAL HIGH (ref 11.6–15.2)

## 2014-08-03 LAB — CBC
HCT: 26.4 % — ABNORMAL LOW (ref 36.0–46.0)
Hemoglobin: 8.8 g/dL — ABNORMAL LOW (ref 12.0–15.0)
MCH: 31.1 pg (ref 26.0–34.0)
MCHC: 33.3 g/dL (ref 30.0–36.0)
MCV: 93.3 fL (ref 78.0–100.0)
PLATELETS: 192 10*3/uL (ref 150–400)
RBC: 2.83 MIL/uL — AB (ref 3.87–5.11)
RDW: 23.4 % — ABNORMAL HIGH (ref 11.5–15.5)
WBC: 10 10*3/uL (ref 4.0–10.5)

## 2014-08-03 LAB — PHOSPHORUS: Phosphorus: 1.7 mg/dL — ABNORMAL LOW (ref 2.3–4.6)

## 2014-08-03 LAB — MAGNESIUM: Magnesium: 2.2 mg/dL (ref 1.5–2.5)

## 2014-08-03 LAB — CLOSTRIDIUM DIFFICILE BY PCR: CDIFFPCR: NEGATIVE

## 2014-08-03 MED ORDER — DEXTROSE 5 % IV SOLN
30.0000 mmol | Freq: Once | INTRAVENOUS | Status: AC
  Administered 2014-08-03: 30 mmol via INTRAVENOUS
  Filled 2014-08-03: qty 10

## 2014-08-03 MED ORDER — SODIUM CHLORIDE 0.9 % IV SOLN
1000.0000 mg | Freq: Two times a day (BID) | INTRAVENOUS | Status: DC
  Administered 2014-08-03 – 2014-08-06 (×7): 1000 mg via INTRAVENOUS
  Filled 2014-08-03 (×9): qty 10

## 2014-08-03 MED ORDER — PHENYTOIN SODIUM 50 MG/ML IJ SOLN
100.0000 mg | Freq: Three times a day (TID) | INTRAMUSCULAR | Status: DC
  Administered 2014-08-03 – 2014-08-06 (×9): 100 mg via INTRAVENOUS
  Filled 2014-08-03 (×16): qty 2

## 2014-08-03 MED ORDER — SODIUM CHLORIDE 0.9 % IV SOLN
1250.0000 mg | Freq: Once | INTRAVENOUS | Status: AC
  Administered 2014-08-03: 1250 mg via INTRAVENOUS
  Filled 2014-08-03: qty 25
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
## 2014-08-03 NOTE — Progress Notes (Signed)
Called Elink MD, Arsenio LoaderSommer, he advised not to use the OG tube that was recently placed today. His instructions were to remove it and have CCM rounding MD in the am decide if the pt needed to have PEG or if we should try placement by fluoroscopy.  Jaime [name]  OG tube has been removed.
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
## 2014-08-03 NOTE — Progress Notes (Signed)
eLink Physician-Brief Progress Note Patient Name: Shaune Spittleatricia S Doetsch DOB: [birth year] MRN: [id number]   Date of Service  08/03/2014  HPI/Events of Note  NGT appears over L lung base. I would not use this tube in it's current position and would remove it.   eICU Interventions  Remove NGT. Primary team to decide PEG vs fluoroscopic placement.      Intervention Category Intermediate Interventions: Diagnostic test evaluation  Lenell AntuSommer,Steven Eugene 08/03/2014, 4:17 PM

## 2014-08-03 NOTE — Progress Notes (Signed)
EEG completed, results pending. 

## 2014-08-03 NOTE — Progress Notes (Signed)
Patient ID: Jaime [name], female   DOB: [birth year], 81 y.o.   MRN: [id number]   Pt with hx SDH--craniotomy and evac 4/20 On vent PCCM request for percutaneous G tube; unable to place NG or OGT per note  Imaging has been reviewed with Dr Lowella DandyHenn Pt has very large hiatal hernia on CXR imaging 2015 This may likely cause pt NOT to be a candidate for percutaneous gastric tube placement  I will order CT Abd w/o cx to see if anatomy will allow for placement and place note in chart asap
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
## 2014-08-03 NOTE — Procedures (Signed)
ELECTROENCEPHALOGRAM REPORT   Patient: Jaime [name]     Room #: 23M-06 Age: 81 y.o.        Sex: female Referring Physician: Dr Amada JupiterKirkpatrick Report Date:  08/03/2014        Interpreting Physician: Omelia BlackwaterSUMNER, Aviel Davalos JUSTIN  History: Jaime [name] is an 81 y.o. female s/p SDH craniotomy and evacuation on 4/20. Noted to have focal seizures involving RUE.   Medications:  Scheduled: . antiseptic oral rinse  7 mL Mouth Rinse QID  . chlorhexidine  15 mL Mouth Rinse BID  . dexamethasone  4 mg Intravenous 4 times per day   Followed by  . dexamethasone  4 mg Intravenous 3 times per day  . feeding supplement (PRO-STAT SUGAR FREE 64)  60 mL Per Tube 5 X Daily  . feeding supplement (VITAL HIGH PROTEIN)  1,000 mL Per Tube Q24H  . ipratropium-albuterol  3 mL Nebulization Q6H  . levETIRAcetam  1,000 mg Intravenous Q12H  . pantoprazole (PROTONIX) IV  40 mg Intravenous QHS  . phenytoin (DILANTIN) IV  100 mg Intravenous 3 times per day  . sodium phosphate  Dextrose 5% IVPB  30 mmol Intravenous Once    Conditions of Recording:  This is a technically limited study due to the inability to place multiple leads on the left side due to recent craniotomy  Description:  The background activity consists predominantly of a low voltage, poorly organized theta activity. No posterior dominant rhythm is noted. Left frontal beta activity is noted, consistent with a possible breach rhythm. Frequent left sided sharp adn slow wave activity is noted with a predominance at C3. No clear evolution is noted. No seizure activity is noted.   Normal sleep architecture is not observed. Hyperventilation and photic stimulation was not performed.    IMPRESSION: Technically limited but abnormal EEG with findings of generalized slowing indicating a mild to moderate cerebral disturbance (encephalopathy). There are frequent left sided sharp waves indicating a potential area of epileptogenic focus. No clear evolution or seizure  activity noted. May benefit from repeat EEG when full lead placement can be obtained.    Elspeth Choeter Nesha Counihan, DO [facility] [phone]  If 7pm- 7am, please page neurology on call as listed in AMION. 08/03/2014, 10:57 AM
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
## 2014-08-03 NOTE — Progress Notes (Signed)
eLink Physician-Brief Progress Note Patient Name: Jaime Spittleatricia S Thew DOB: Nov 05, 1927 MRN: [id number]   Date of Service  08/03/2014  HPI/Events of Note  hypophosphatemia  eICU Interventions  Phos replaced     Intervention Category Intermediate Interventions: Electrolyte abnormality - evaluation and management  DETERDING,ELIZABETH 08/03/2014, 6:17 AM

## 2014-08-04 ENCOUNTER — Inpatient Hospital Stay (HOSPITAL_COMMUNITY): Payer: Medicare PPO

## 2014-08-04 DIAGNOSIS — R569 Unspecified convulsions: Secondary | ICD-10-CM

## 2014-08-04 LAB — CBC
HCT: 25.2 % — ABNORMAL LOW (ref 36.0–46.0)
HEMOGLOBIN: 8.3 g/dL — AB (ref 12.0–15.0)
MCH: 31.2 pg (ref 26.0–34.0)
MCHC: 32.9 g/dL (ref 30.0–36.0)
MCV: 94.7 fL (ref 78.0–100.0)
Platelets: 220 10*3/uL (ref 150–400)
RBC: 2.66 MIL/uL — ABNORMAL LOW (ref 3.87–5.11)
RDW: 24.2 % — ABNORMAL HIGH (ref 11.5–15.5)
WBC: 7.2 10*3/uL (ref 4.0–10.5)

## 2014-08-04 LAB — GLUCOSE, CAPILLARY
GLUCOSE-CAPILLARY: 120 mg/dL — AB (ref 70–99)
GLUCOSE-CAPILLARY: 138 mg/dL — AB (ref 70–99)
Glucose-Capillary: 105 mg/dL — ABNORMAL HIGH (ref 70–99)
Glucose-Capillary: 112 mg/dL — ABNORMAL HIGH (ref 70–99)
Glucose-Capillary: 122 mg/dL — ABNORMAL HIGH (ref 70–99)
Glucose-Capillary: 131 mg/dL — ABNORMAL HIGH (ref 70–99)

## 2014-08-04 LAB — BASIC METABOLIC PANEL
ANION GAP: 8 (ref 5–15)
BUN: 16 mg/dL (ref 6–23)
CALCIUM: 8 mg/dL — AB (ref 8.4–10.5)
CO2: 22 mmol/L (ref 19–32)
CREATININE: 0.49 mg/dL — AB (ref 0.50–1.10)
Chloride: 110 mmol/L (ref 96–112)
GFR calc non Af Amer: 87 mL/min — ABNORMAL LOW (ref >90)
Glucose, Bld: 135 mg/dL — ABNORMAL HIGH (ref 70–99)
Potassium: 4.4 mmol/L (ref 3.5–5.1)
SODIUM: 140 mmol/L (ref 135–145)

## 2014-08-04 LAB — PHENYTOIN LEVEL, TOTAL: Phenytoin Lvl: 11.9 ug/mL (ref 10.0–20.0)
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
## 2014-08-04 NOTE — Progress Notes (Signed)
eLink Physician-Brief Progress Note Patient Name: Jaime Spittleatricia S Reichardt DOB: 09-Sep-1927 MRN: [id number]   Date of Service  08/04/2014  HPI/Events of Note  Concern for patient safety  eICU Interventions  Order for soft left wrist restraint     Intervention Category Intermediate Interventions: Other:  Gene Colee 08/04/2014, 1:59 AM

## 2014-08-05 ENCOUNTER — Inpatient Hospital Stay (HOSPITAL_COMMUNITY): Payer: Medicare PPO

## 2014-08-05 ENCOUNTER — Encounter (HOSPITAL_COMMUNITY): Payer: Self-pay | Admitting: Neurosurgery

## 2014-08-05 DIAGNOSIS — Z4659 Encounter for fitting and adjustment of other gastrointestinal appliance and device: Secondary | ICD-10-CM | POA: Insufficient documentation

## 2014-08-05 DIAGNOSIS — H9193 Unspecified hearing loss, bilateral: Secondary | ICD-10-CM

## 2014-08-05 DIAGNOSIS — H540 Blindness, both eyes: Secondary | ICD-10-CM

## 2014-08-05 DIAGNOSIS — H9190 Unspecified hearing loss, unspecified ear: Secondary | ICD-10-CM | POA: Insufficient documentation

## 2014-08-05 DIAGNOSIS — H547 Unspecified visual loss: Secondary | ICD-10-CM | POA: Insufficient documentation

## 2014-08-05 LAB — CBC
HEMATOCRIT: 28.1 % — AB (ref 36.0–46.0)
HEMOGLOBIN: 9.1 g/dL — AB (ref 12.0–15.0)
MCH: 31 pg (ref 26.0–34.0)
MCHC: 32.4 g/dL (ref 30.0–36.0)
MCV: 95.6 fL (ref 78.0–100.0)
PLATELETS: 264 10*3/uL (ref 150–400)
RBC: 2.94 MIL/uL — ABNORMAL LOW (ref 3.87–5.11)
RDW: 23.6 % — ABNORMAL HIGH (ref 11.5–15.5)
WBC: 15.2 10*3/uL — AB (ref 4.0–10.5)

## 2014-08-05 LAB — TYPE AND SCREEN
ABO/RH(D): A NEG
Antibody Screen: NEGATIVE
Unit division: 0
Unit division: 0
Unit division: 0
Unit division: 0

## 2014-08-05 LAB — BASIC METABOLIC PANEL
Anion gap: 9 (ref 5–15)
BUN: 12 mg/dL (ref 6–23)
CALCIUM: 8.3 mg/dL — AB (ref 8.4–10.5)
CHLORIDE: 106 mmol/L (ref 96–112)
CO2: 23 mmol/L (ref 19–32)
CREATININE: 0.5 mg/dL (ref 0.50–1.10)
GFR calc Af Amer: >90 mL/min (ref >90)
GFR, EST NON AFRICAN AMERICAN: 86 mL/min — AB (ref >90)
GLUCOSE: 103 mg/dL — AB (ref 70–99)
POTASSIUM: 4.7 mmol/L (ref 3.5–5.1)
SODIUM: 138 mmol/L (ref 135–145)

## 2014-08-05 LAB — GLUCOSE, CAPILLARY
GLUCOSE-CAPILLARY: 114 mg/dL — AB (ref 70–99)
GLUCOSE-CAPILLARY: 110 mg/dL — AB (ref 70–99)
GLUCOSE-CAPILLARY: 89 mg/dL (ref 70–99)
GLUCOSE-CAPILLARY: 105 mg/dL — AB (ref 70–99)
Glucose-Capillary: 131 mg/dL — ABNORMAL HIGH (ref 70–99)
Glucose-Capillary: 105 mg/dL — ABNORMAL HIGH (ref 70–99)

## 2014-08-05 LAB — PHOSPHORUS: PHOSPHORUS: 1.9 mg/dL — AB (ref 2.3–4.6)

## 2014-08-05 MED ORDER — IOHEXOL 300 MG/ML  SOLN
50.0000 mL | Freq: Once | INTRAMUSCULAR | Status: AC | PRN
  Administered 2014-08-05: 50 mL

## 2014-08-05 MED ORDER — FUROSEMIDE 10 MG/ML IJ SOLN
40.0000 mg | Freq: Four times a day (QID) | INTRAMUSCULAR | Status: AC
Start: 2014-08-05 — End: 2014-08-05
  Administered 2014-08-05 (×2): 40 mg via INTRAVENOUS
  Filled 2014-08-05 (×2): qty 4

## 2014-08-05 NOTE — Progress Notes (Addendum)
Subjective: No recurrence of seizure activity reported over the past 24 hours. No overnight adverse events reported other than agitation, requiring wrist restraints and sedation.  Objective: Current vital signs: BP 154/53 mmHg  Pulse 99  Temp(Src) 99.6 F (37.6 C) (Oral)  Resp 27  Ht 5\' 8"  (1.727 m)  Wt 72.4 kg (159 lb 9.8 oz)  BMI 24.27 kg/m2  SpO2 94%  Neurologic Exam: Patient was intubated and on ventilator but is currently being weaned. He was given fentanyl earlier this morning and is sedated. She responds with grimacing and movement of extremities. Bilateral prosthetic eyes noted. No conjunctivitis noted. Face was symmetrical with no focal weakness. Muscle tone was flaccid throughout at rest. She moved upper extremities equally with good strength. Deep tendon reflexes were absent bilaterally. Plantar responses were extensor bilaterally.  Medications: I have reviewed the patient's current medications.  Assessment/Plan: 81 year old lady status post left subdural hematoma evacuation on 08/01/2014 with history of seizures, currently controlled with Keppra and Dilantin.  Recommend no changes in current management. We will continue to follow this patient with you.
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
## 2014-08-05 NOTE — Progress Notes (Signed)
UR completed.  Yesha Muchow, RN BSN MHA CCM Trauma/Neuro ICU Case Manager [phone]  

## 2014-08-05 NOTE — Progress Notes (Signed)
No further seizures. Patient remains somnolent but will awaken to stimulation and is purposeful bilaterally left greater than right. Not following commands currently.  Status post left craniotomy for subdural hematoma. Overall progressing slowly. Working towards extubation. No new changes otherwise.

## 2014-08-06 ENCOUNTER — Inpatient Hospital Stay (HOSPITAL_COMMUNITY): Payer: Medicare PPO

## 2014-08-06 DIAGNOSIS — S065X0A Traumatic subdural hemorrhage without loss of consciousness, initial encounter: Principal | ICD-10-CM

## 2014-08-06 DIAGNOSIS — G9340 Encephalopathy, unspecified: Secondary | ICD-10-CM

## 2014-08-06 DIAGNOSIS — R451 Restlessness and agitation: Secondary | ICD-10-CM | POA: Insufficient documentation

## 2014-08-06 LAB — BASIC METABOLIC PANEL
ANION GAP: 11 (ref 5–15)
BUN: 16 mg/dL (ref 6–23)
CHLORIDE: 96 mmol/L (ref 96–112)
CO2: 29 mmol/L (ref 19–32)
Calcium: 8.4 mg/dL (ref 8.4–10.5)
Creatinine, Ser: 0.58 mg/dL (ref 0.50–1.10)
GFR, EST NON AFRICAN AMERICAN: 82 mL/min — AB (ref >90)
Glucose, Bld: 120 mg/dL — ABNORMAL HIGH (ref 70–99)
POTASSIUM: 3.7 mmol/L (ref 3.5–5.1)
SODIUM: 136 mmol/L (ref 135–145)

## 2014-08-06 LAB — GLUCOSE, CAPILLARY
GLUCOSE-CAPILLARY: 157 mg/dL — AB (ref 70–99)
GLUCOSE-CAPILLARY: 156 mg/dL — AB (ref 70–99)
Glucose-Capillary: 105 mg/dL — ABNORMAL HIGH (ref 70–99)
Glucose-Capillary: 141 mg/dL — ABNORMAL HIGH (ref 70–99)
Glucose-Capillary: 143 mg/dL — ABNORMAL HIGH (ref 70–99)
Glucose-Capillary: 162 mg/dL — ABNORMAL HIGH (ref 70–99)

## 2014-08-06 MED ORDER — MIDAZOLAM HCL 2 MG/2ML IJ SOLN
2.0000 mg | Freq: Once | INTRAMUSCULAR | Status: AC | PRN
  Administered 2014-08-06: 2 mg via INTRAVENOUS
  Filled 2014-08-06: qty 2

## 2014-08-06 MED ORDER — FUROSEMIDE 10 MG/ML IJ SOLN
40.0000 mg | Freq: Four times a day (QID) | INTRAMUSCULAR | Status: AC
  Administered 2014-08-06 (×2): 40 mg via INTRAVENOUS
  Filled 2014-08-06 (×2): qty 4

## 2014-08-06 MED ORDER — DEXMEDETOMIDINE HCL IN NACL 200 MCG/50ML IV SOLN
0.0000 ug/kg/h | INTRAVENOUS | Status: DC
  Administered 2014-08-06: 0.7 ug/kg/h via INTRAVENOUS
  Administered 2014-08-06: 0.4 ug/kg/h via INTRAVENOUS
  Administered 2014-08-07: 0.2 ug/kg/h via INTRAVENOUS
  Filled 2014-08-06 (×4): qty 50

## 2014-08-06 MED ORDER — FENTANYL CITRATE (PF) 100 MCG/2ML IJ SOLN: 25.0000 ug | INTRAMUSCULAR | Status: DC | PRN

## 2014-08-06 MED ORDER — POTASSIUM CHLORIDE 20 MEQ/15ML (10%) PO SOLN
40.0000 meq | Freq: Four times a day (QID) | ORAL | Status: AC
  Administered 2014-08-06 (×2): 40 meq
  Filled 2014-08-06 (×2): qty 30

## 2014-08-06 MED ORDER — DEXAMETHASONE SODIUM PHOSPHATE 4 MG/ML IJ SOLN
4.0000 mg | Freq: Two times a day (BID) | INTRAMUSCULAR | Status: DC
  Administered 2014-08-06 – 2014-08-07 (×2): 4 mg via INTRAVENOUS
  Filled 2014-08-06 (×2): qty 1

## 2014-08-06 MED ORDER — LEVETIRACETAM 100 MG/ML PO SOLN
1000.0000 mg | Freq: Two times a day (BID) | ORAL | Status: DC
  Administered 2014-08-06 – 2014-08-07 (×2): 1000 mg
  Filled 2014-08-06 (×4): qty 10

## 2014-08-06 MED ORDER — SODIUM CHLORIDE 0.9 % IJ SOLN
10.0000 mL | Freq: Two times a day (BID) | INTRAMUSCULAR | Status: DC
  Administered 2014-08-06 – 2014-08-07 (×3): 10 mL
  Administered 2014-08-07 – 2014-08-09 (×3): 20 mL

## 2014-08-06 MED ORDER — PHENYTOIN 125 MG/5ML PO SUSP
100.0000 mg | Freq: Three times a day (TID) | ORAL | Status: DC
  Administered 2014-08-06 – 2014-08-07 (×4): 100 mg
  Filled 2014-08-06 (×5): qty 4

## 2014-08-06 MED ORDER — FAMOTIDINE 40 MG/5ML PO SUSR
20.0000 mg | Freq: Two times a day (BID) | ORAL | Status: DC
  Administered 2014-08-06 – 2014-08-07 (×3): 20 mg
  Filled 2014-08-06 (×4): qty 2.5

## 2014-08-06 MED ORDER — SODIUM CHLORIDE 0.9 % IJ SOLN
10.0000 mL | INTRAMUSCULAR | Status: DC | PRN
  Administered 2014-08-09: 10 mL
  Filled 2014-08-06: qty 40

## 2014-08-06 NOTE — Progress Notes (Signed)
Subjective: No seizures over night. Continues to be mildly agitated and requiring wrist restraints.    Objective: Current vital signs: BP 150/64 mmHg  Pulse 84  Temp(Src) 99.1 F (37.3 C) (Axillary)  Resp 17  Ht  (1.575 m)  Wt 85.2 kg (187 lb 13.3 oz)  BMI 34.35 kg/m2  SpO2 94% Vital signs in last 24 hours: Temp:  [97.9 F (36.6 C)-99.1 F (37.3 C)] 99.1 F (37.3 C) (04/26 0756) Pulse Rate:  [66-95] 84 (04/26 0800) Resp:  [12-21] 17 (04/26 0800) BP: (131-179)/(57-79) 150/64 mmHg (04/26 0800) SpO2:  [92 %-100 %] 94 % (04/26 0834) FiO2 (%):  [30 %-40 %] 30 % (04/26 0835) Weight:  [85.2 kg (187 lb 13.3 oz)] 85.2 kg (187 lb 13.3 oz) (04/26 0500)  Intake/Output from previous day: 04/25 0701 - 04/26 0700 In: 1375 [I.V.:1185; NG/GT:90; IV Piggyback:100] Out: 6470 [Urine:6470] Intake/Output this shift: Total I/O In: 20 [I.V.:10; NG/GT:10] Out: -  Nutritional status:    Neurologic Exam: General: Mental Status: On Fentanyl. Grimaces to pain and forcefully closes eyes when eyelids attempted to be opened.  Cranial Nerves: II: Bilateral prosthetic eyes III,IV, VI: bilateral prosthetic eyes V,VII: face symmetric, grimaces to pain bilaterally  Motor: Moving bilateral UE equally with good strength and withdraws from pain bilateral LE antigravity.  Sensory: Pinprick and light touch intact throughout, bilaterally Deep Tendon Reflexes:  Absent bilateral UE and LE  Plantars: Mute bilaterally    Lab Results: Basic Metabolic Panel:  Recent Labs Lab 08/02/14 1600 08/03/14 0445 08/04/14 0500 08/05/14 0540 08/06/14 0210  NA 137 140 140 138 136  K 4.1 4.3 4.4 4.7 3.7  CL 108 109 110 106 96  CO2 GLUCOSE 146* 132* 135* 103* 120*  BUN CREATININE 0.55 0.49* 0.49* 0.50 0.58  CALCIUM 8.1* 8.2* 8.0* 8.3* 8.4  MG  --  2.2  --   --   --   PHOS  --  1.7*  --  1.9*  --     Liver Function Tests: No results for input(s): AST, ALT, ALKPHOS,  BILITOT, PROT, ALBUMIN in the last 168 hours. No results for input(s): LIPASE, AMYLASE in the last 168 hours. No results for input(s): AMMONIA in the last 168 hours.  CBC:  Recent Labs Lab 08/01/14 1345 08/02/14 1600 08/03/14 0445 08/04/14 0500 08/05/14 0540  WBC 16.7* 11.0* 10.0 7.2 15.2*  HGB 7.6* 6.6* 8.8* 8.3* 9.1*  HCT 23.2* 20.5* 26.4* 25.2* 28.1*  MCV 98.7 99.5 93.3 94.7 95.6  PLT 239 218 192 220 264    Cardiac Enzymes: No results for input(s): CKTOTAL, CKMB, CKMBINDEX, TROPONINI in the last 168 hours.  Lipid Panel:  Recent Labs Lab 08/01/14 1644  TRIG 47    CBG:  Recent Labs Lab 08/05/14 1553 08/05/14 1947 08/05/14 2352 08/06/14 0337 08/06/14 0754  GLUCAP 131* 105* 143* 105* 157*    Microbiology: Results for orders placed or performed during the hospital encounter of 07/30/14  MRSA PCR Screening     Status: None   Collection Time: 07/30/14 12:43 PM  Result Value Ref Range Status   MRSA by PCR NEGATIVE NEGATIVE Final    Comment:        The GeneXpert MRSA Assay (FDA approved for NASAL specimens only), is one component of a comprehensive MRSA colonization surveillance program. It is not intended to diagnose MRSA infection nor to guide or monitor treatment for MRSA infections.   Clostridium Difficile  by PCR     Status: None   Collection Time: 08/03/14  4:51 AM  Result Value Ref Range Status   C difficile by pcr NEGATIVE NEGATIVE Final    Coagulation Studies:  Recent Labs  08/03/14 1350  LABPROT 15.3*  INR 1.20    Imaging: Ct Abdomen Wo Contrast  08/04/2014   CLINICAL DATA:  Large hiatal hernia. CT required for percutaneous gastrostomy planning.  EXAM: CT ABDOMEN WITHOUT CONTRAST  TECHNIQUE: Multidetector CT imaging of the abdomen was performed following the standard protocol without IV contrast.  COMPARISON:  08/03/2014.  FINDINGS: Musculoskeletal: Osteopenia. Lumbar spondylosis and scoliosis. Chronic compression fractures and Schmorl's  nodes.  Lung Bases: Small bilateral pleural effusions with bilateral collapse/ consolidation of the dependent lower lobes. Coronary artery atherosclerosis is present. If office based assessment of coronary risk factors has not been performed, it is now recommended. Mitral annular calcification.  Liver: Unenhanced CT was performed per clinician order. Lack of IV contrast limits sensitivity and specificity, especially for evaluation of abdominal/pelvic solid viscera. Old granulomatous disease of the liver. Mild intrahepatic biliary ductal dilation, likely postcholecystectomy.  Spleen:  Normal.  Gallbladder:  Cholecystectomy.  Common bile duct:  Within normal limits.  Pancreas:  Pancreatic atrophy.  No acute abnormality.  Adrenal glands:  Normal bilaterally.  Kidneys: Nonspecific bilateral perinephric stranding. No hydronephrosis. No calculi. Ureters appear normal.  Stomach: Large hiatal hernia. Almost all of the stomach is in the inferior chest with the antral pyloric region of the stomach at the normal level of the hiatus.  Small bowel:  No small bowel obstruction.  Colon: Normal appendix. Diverticulosis. Liquid stool is present in the rectosigmoid, a nonspecific finding.  Pelvic Genitourinary: Atrophy of the uterus. Foley catheter in the urinary bladder. Urinary bladder is collapsed.  Peritoneum: Small amount of ascites.  Vasculature: Atherosclerosis.  No gross acute vascular abnormality.  Body Wall: Fat containing supraumbilical ventral hernia. Stranding in the Body Wall, compatible with anasarca.  IMPRESSION: 1. Intrathoracic stomach. Almost all of the stomach is in the chest. 2. Old granulomatous disease. 3. Small amount of ascites. Bilateral pleural effusions and collapse of the dependent portions of both lower lobes.   Electronically Signed   By: Andreas Newport M.D.   On: 08/04/2014 16:56   Dg Abd 1 View  08/05/2014   CLINICAL DATA:  Fluoroscopy for Dobbhoff feeding tube placement.  EXAM: ABDOMEN - 1 VIEW   COMPARISON:  None.  FINDINGS: Two fluoroscopic images of the abdomen show placement of an enteric feeding tube. Tip projects in the third and fourth portion of the duodenum. Contrast is injected filling the proximal jejunum.  IMPRESSION: Fluoroscopic placement of an enteric feeding tube. Tip is well positioned at the ligament of Treitz.   Electronically Signed   By: Amie Portland M.D.   On: 08/05/2014 19:12   Dg Chest Port 1 View  08/06/2014   CLINICAL DATA:  Worse part or if failure.  EXAM: PORTABLE CHEST - 1 VIEW  COMPARISON:  One-view chest x-ray 08/05/2014.  FINDINGS: The heart is enlarged. A large hiatal hernia is again noted. The endotracheal tube terminates 2.5 cm above the carina. The feeding tube can be seen looped within the hiatal hernia. Lung aeration is slightly improved. Small effusions persist. Bibasilar airspace disease is improved but not completely resolved. Mild pulmonary vascular congestion persists.  IMPRESSION: 1. Improving aeration with decreased pulmonary vascular congestion and bibasilar airspace disease, likely atelectasis. 2. Small bilateral pleural effusions persist. 3. Large hiatal hernia. 4.  The endotracheal tube terminates 2.5 cm above the carina.   Electronically Signed   By: Marin Robertshristopher  Mattern M.D.   On: 08/06/2014 08:07   Dg Chest Port 1 View  08/05/2014   CLINICAL DATA:  81 year old female with acute respiratory failure.  EXAM: PORTABLE CHEST - 1 VIEW  COMPARISON:  Chest x-ray 08/04/2014.  FINDINGS: An endotracheal tube is in place with tip 5 mm cm above the carina. Lung volumes are very low. Worsening opacities throughout the mid to lower lungs bilaterally may reflect a combination of atelectasis and/or consolidation, likely with superimposed small bilateral pleural effusions (left greater than right). Vascular crowding, without definite pulmonary edema identified in the upper lungs. Mild cardiomegaly. Upper mediastinal contours are distorted by patient positioning.  Atherosclerosis in the thoracic aorta. Several metallic densities are again noted over the upper chest.  IMPRESSION: 1. Endotracheal tube is low-lying, with tip only 5 mm above the carina. This should be withdrawn approximately 3 cm for more optimal placement. 2. Worsening opacities throughout the mid to lower lungs bilaterally which may reflect worsening areas of atelectasis and/or consolidation, likely with enlarging small bilateral pleural effusions (left greater than right). 3. Mild cardiomegaly. 4. Atherosclerosis. These results will be called to the ordering clinician or representative by the Radiologist Assistant, and communication documented in the PACS or zVision Dashboard.   Electronically Signed   By: Trudie Reedaniel  Entrikin M.D.   On: 08/05/2014 07:33   Dg Vangie BickerNaso G Tube Plc W/fl-no Rad  08/05/2014   CLINICAL DATA:    NASO G TUBE PLACEMENT WITH FLUORO  Fluoroscopy was utilized by the requesting physician.  No radiographic  interpretation.     Medications:  Scheduled: . antiseptic oral rinse  7 mL Mouth Rinse QID  . chlorhexidine  15 mL Mouth Rinse BID  . dexamethasone  4 mg Intravenous 3 times per day  . feeding supplement (PRO-STAT SUGAR FREE 64)  60 mL Per Tube 5 X Daily  . feeding supplement (VITAL HIGH PROTEIN)  1,000 mL Per Tube Q24H  . ipratropium-albuterol  3 mL Nebulization Q6H  . levETIRAcetam  1,000 mg Intravenous Q12H  . pantoprazole (PROTONIX) IV  40 mg Intravenous QHS  . phenytoin (DILANTIN) IV  100 mg Intravenous 3 times per day    Assessment/Plan:  81 year old lady status post left subdural hematoma evacuation on 08/01/2014 with history of seizures, currently controlled with Keppra and Dilantin. Continue current AED regime.   No neurodiagnostic studies are indicated at this point. We will plan to see her in follow-up on an as-needed basis.  Felicie MornDavid Smith PA-C [facility] [phone]  I personally participated in this patient's evaluation and management,  including formulating above clinical impression and management recommendations.  Venetia MaxonR Shirely Toren M.D. [facility] [phone]  08/06/2014, 10:38 AM

## 2014-08-06 NOTE — Progress Notes (Signed)
Peripherally Inserted Central Catheter/Midline Placement  The IV Nurse has discussed with the patient and/or persons authorized to consent for the patient, the purpose of this procedure and the potential benefits and risks involved with this procedure.  The benefits include less needle sticks, lab draws from the catheter and patient may be discharged home with the catheter.  Risks include, but not limited to, infection, bleeding, blood clot (thrombus formation), and puncture of an artery; nerve damage and irregular heat beat.  Alternatives to this procedure were also discussed.  PICC/Midline Placement Documentation        Lisabeth DevoidGibbs, Jeanette Rauth Jeanette 08/06/2014, 2:33 PM Consent obtained by Lazarus Gowdaenee Newman, RN from husband

## 2014-08-06 NOTE — Progress Notes (Signed)
Attempted PICC line placement at bedside but was unable to place because patient was unable to follow instructions and  moving about so much that it compromised sterile field . Staff nurse made aware.

## 2014-08-06 NOTE — Progress Notes (Signed)
PULMONARY / CRITICAL CARE MEDICINE   Name: Jaime Spittleatricia S Masi MRN: [id number] DOB: 05-28-1927    ADMISSION DATE:  07/30/2014 CONSULTATION DATE:  08/06/2014  REFERRING MD :  Jordan LikesPool  CHIEF COMPLAINT:  VDRF  INITIAL PRESENTATION:  81 y.o. F [facility] 4/19 with headache after a mechanical fall.  She was found to have a left sided SDH and [facility]  Overnight 4/20, she decompensated so on AM of 4/21, was taken to OR for craniotomy and evacuation of SDH.  She returned to ICU on the vent and PCCM was consulted for vent management.   STUDIES:  4/19 CT head [facility]): L SDH 4/19  Admit to NS service with SDH 4/20  Decompensated overnight 4/20 CT head: enlarging left SDH with increased midline shift4/21  L frontotemporal craniotomy with evacuation of SDH and placement of subdural drain.  Returned to ICU on vent, PCCM consulted 4/22  Seizure x2 4/24  EEG: no clear seizure activity  4/25 Failed SBT due to agitation and tachypnea. CXR C/W edema. Furosemide given. NGT placed by IR 4/26 more responsive. Possibly less agitated. Received fentanyl with subsequent somnolence prohibiting extubation. Pulm edema improved. Further furosemide given for persistent mild edema pattern  SUBJECTIVE:   NSC  VITAL SIGNS: Temp:  [97.9 F (36.6 C)-100 F (37.8 C)] 100 F (37.8 C) (04/26 1143) Pulse Rate:  [66-102] 102 (04/26 1400) Resp:  [12-26] 20 (04/26 1400) BP: (130-179)/(54-77) 152/69 mmHg (04/26 1400) SpO2:  [91 %-100 %] 96 % (04/26 1409) FiO2 (%):  [30 %-40 %] 30 % (04/26 1507) Weight:  [85.2 kg (187 lb 13.3 oz)] 85.2 kg (187 lb 13.3 oz) (04/26 0500)   HEMODYNAMICS:     VENTILATOR SETTINGS: Vent Mode:  [-] PSV FiO2 (%):  [30 %-40 %] 30 % Set Rate:  [14 bmp] 14 bmp Vt Set:  [440 mL] 440 mL PEEP:  [5 cmH20] 5 cmH20 Pressure Support:  [10 cmH20] 10 cmH20 Plateau Pressure:  [14 cmH20-17 cmH20] 16 cmH20  INTAKE / OUTPUT: Intake/Output      04/25 0701 - 04/26 0700 04/26 0701 -  04/27 0700   I.V. (mL/kg) 1185 (13.9) 91.4 (1.1)   NG/GT 90 80   IV Piggyback 100 110   Total Intake(mL/kg) 1375 (16.1) 281.4 (3.3)   Urine (mL/kg/hr) 6470 (3.2) 275 (0.4)   Total Output 6470 275   Net -5095 +6.4         PHYSICAL EXAMINATION: - baseline HOH, BLIND General: RASS -3 Neuro: MAEs, not F/C HEENT: [place]/AT, B eye prostheses Cardiovascular: Reg, no M Lungs: Clear enteriorly Abdomen: Soft, NT, +BS Ext: no edema  LABS:  CBC  Recent Labs Lab 08/03/14 0445 08/04/14 0500 08/05/14 0540  WBC 10.0 7.2 15.2*  HGB 8.8* 8.3* 9.1*  HCT 26.4* 25.2* 28.1*  PLT 192 220 264   Coag's  Recent Labs Lab 08/03/14 1350  APTT 34  INR 1.20     BMET  Recent Labs Lab 08/04/14 0500 08/05/14 0540 08/06/14 0210  NA 140 138 136  K 4.4 4.7 3.7  CL 110 106 96  CO2 22 23 29   BUN 16 12 16   CREATININE 0.49* 0.50 0.58  GLUCOSE 135* 103* 120*   Electrolytes  Recent Labs Lab 08/03/14 0445 08/04/14 0500 08/05/14 0540 08/06/14 0210  CALCIUM 8.2* 8.0* 8.3* 8.4  MG 2.2  --   --   --   PHOS 1.7*  --  1.9*  --    ABG  Recent Labs Lab  08/02/14 1653 08/03/14 0325  PHART 7.387 7.413  PCO2ART 35.5 34.6*  PO2ART 95.0 107.0*   Glucose  Recent Labs Lab 08/05/14 1553 08/05/14 1947 08/05/14 2352 08/06/14 0337 08/06/14 0754 08/06/14 1151  GLUCAP 131* 105* 143* 105* 157* 141*   CXR: Improved edema pattern  ASSESSMENT / PLAN:  NEUROLOGIC A:   L SDH Ss/p left frontotemporal craniotomy with evacuation of SDH Seizures - noted overnight 4/22 HOH, BLIND Agitation - likely exacerbated by inability to communicate with her P:   RASS goal: 0 Post op mgmt per NS Dexmedetomidine gtt 4/26 Minimize sedating meds in anticipation of extubation 4/27  PULMONARY OETT 4/21 >>  A: VDRF following craniotomy Pulm edema P:   Cont vent support - settings reviewed and/or adjusted Wean in PSV as tolerated Cont vent bundle Daily SBT if/when meets criteria Anticipate  extubation 4/27 Poor candidate for trach tube  CARDIOVASCULAR A:  Hypertension, controlled P:  Cont labetalol PRN. Goal SBP < 160  RENAL A:   No issues  P:   Monitor BMET intermittently Monitor I/Os Correct electrolytes as indicated  GASTROINTESTINAL A:   GERD Large Hiatal Hernia  P:   SUP: enteral famotidine Cont TFs  HEMATOLOGIC A:   Anemia VTE Prophylaxis P:  Transfuse for Hgb < 7. SCD's. CBC in AM. Hold outpatient xarelto (? Reason taking).  INFECTIOUS A:   No acute infection P:   Monitor temp, WBC count  ENDOCRINE A:   Mild hyperglycemia without prior dx of DM P:   SSI if glucose consistently > 180  Husband updated @ bedside  CCM time 35 mins  Billy Fischer, MD ; [facility] [phone].  After 5:30 PM or weekends, call [phone]

## 2014-08-06 NOTE — Progress Notes (Signed)
Patient opens eyes to voice this morning appears aware. Does not follow commands. Moving her right side more spontaneously and strongly however. Wound clean and dry.  From a neurologic standpoint she is improving and looks able to tolerate extubation. Continue supportive care. No new recommendations.

## 2014-08-07 ENCOUNTER — Inpatient Hospital Stay (HOSPITAL_COMMUNITY): Payer: Medicare PPO

## 2014-08-07 LAB — BASIC METABOLIC PANEL
Anion gap: 8 (ref 5–15)
BUN: 50 mg/dL — ABNORMAL HIGH (ref 6–23)
CO2: 29 mmol/L (ref 19–32)
Calcium: 8 mg/dL — ABNORMAL LOW (ref 8.4–10.5)
Chloride: 99 mmol/L (ref 96–112)
Creatinine, Ser: 0.7 mg/dL (ref 0.50–1.10)
GFR, EST AFRICAN AMERICAN: 90 mL/min — AB (ref >90)
GFR, EST NON AFRICAN AMERICAN: 77 mL/min — AB (ref >90)
Glucose, Bld: 99 mg/dL (ref 70–99)
Potassium: 4.3 mmol/L (ref 3.5–5.1)
Sodium: 136 mmol/L (ref 135–145)

## 2014-08-07 LAB — GLUCOSE, CAPILLARY
GLUCOSE-CAPILLARY: 124 mg/dL — AB (ref 70–99)
GLUCOSE-CAPILLARY: 144 mg/dL — AB (ref 70–99)
GLUCOSE-CAPILLARY: 129 mg/dL — AB (ref 70–99)
Glucose-Capillary: 183 mg/dL — ABNORMAL HIGH (ref 70–99)

## 2014-08-07 MED ORDER — DEXAMETHASONE SODIUM PHOSPHATE 4 MG/ML IJ SOLN
2.0000 mg | Freq: Two times a day (BID) | INTRAMUSCULAR | Status: DC
  Administered 2014-08-07: 2 mg via INTRAVENOUS
  Filled 2014-08-07 (×2): qty 0.5

## 2014-08-07 MED ORDER — FAMOTIDINE IN NACL 20-0.9 MG/50ML-% IV SOLN
20.0000 mg | Freq: Two times a day (BID) | INTRAVENOUS | Status: DC
  Administered 2014-08-07: 20 mg via INTRAVENOUS
  Filled 2014-08-07 (×3): qty 50

## 2014-08-07 MED ORDER — SODIUM CHLORIDE 0.9 % IV SOLN
1000.0000 mg | Freq: Two times a day (BID) | INTRAVENOUS | Status: DC
  Administered 2014-08-07 – 2014-08-09 (×4): 1000 mg via INTRAVENOUS
  Filled 2014-08-07 (×5): qty 10

## 2014-08-07 MED ORDER — CHLORHEXIDINE GLUCONATE 0.12 % MT SOLN
15.0000 mL | Freq: Two times a day (BID) | OROMUCOSAL | Status: DC
  Administered 2014-08-07 – 2014-08-08 (×2): 15 mL via OROMUCOSAL
  Filled 2014-08-07 (×2): qty 15

## 2014-08-07 MED ORDER — CETYLPYRIDINIUM CHLORIDE 0.05 % MT LIQD
7.0000 mL | Freq: Two times a day (BID) | OROMUCOSAL | Status: DC
  Administered 2014-08-09: 7 mL via OROMUCOSAL

## 2014-08-07 MED ORDER — FENTANYL CITRATE (PF) 100 MCG/2ML IJ SOLN: 12.5000 ug | INTRAMUSCULAR | Status: DC | PRN

## 2014-08-07 MED ORDER — PHENYTOIN SODIUM 50 MG/ML IJ SOLN
100.0000 mg | Freq: Three times a day (TID) | INTRAMUSCULAR | Status: DC
  Administered 2014-08-07 – 2014-08-09 (×6): 100 mg via INTRAVENOUS
  Filled 2014-08-07 (×8): qty 2

## 2014-08-07 NOTE — Clinical Documentation Improvement (Signed)
Presents with SDH with craniotomy and evacuation of hematoma performed.   Pulmonary Edema documented  Being treated with IV Lasix q6h  Please clarify the acuity of the patient's pulmonary edema and document findings in next progress note and include in discharge summary if applicable  Acute  Chronic  Acute on Chronic  Other Condition__________________  Cannot Clinically Determine   Thank You, Shellee MiloEileen T Demira Gwynne ,RN Clinical Documentation Specialist:  [phone]  [facility]- Health Information Management

## 2014-08-07 NOTE — Progress Notes (Signed)
eLink Physician-Brief Progress Note Patient Name: Jaime Spittleatricia S Uballe DOB: [birth year] MRN: [id number]   Date of Service  08/07/2014  HPI/Events of Note  Pt pulled her Panda tube. Likely will undergo swallowing eval 4/28. Will change keppra, dilantin, pepcid to IV and reassess need for enteral access tomorrow  eICU Interventions       Intervention Category Minor Interventions: Routine modifications to care plan (e.g. PRN medications for pain, fever)  Penney Domanski S. 08/07/2014, 7:37 PM

## 2014-08-07 NOTE — Progress Notes (Signed)
Panda pulled out panda tube. Elink MD notified.

## 2014-08-07 NOTE — Progress Notes (Signed)
Sedated this morning. Patient will awaken to vigorous stem. Moving all 4 extremities. Right hemiparesis much better. No further episodes of seizure activity.  Afebrile. Vitals are stable. Electrolytes acceptable. Urine output good.  Overall progressing slowly. Extubation plans per critical care medicine. Continue supportive care.

## 2014-08-07 NOTE — Procedures (Signed)
Extubation Procedure Note  Patient Details:   Name: Jaime [name] DOB: 28-Feb-1928 MRN: [id number]   Airway Documentation:     Evaluation  O2 sats: stable throughout Complications: No apparent complications Patient did tolerate procedure well. Bilateral Breath Sounds: Diminished (coarse) Suctioning: Oral, Airway Yes   Pt tolerated wean, extubated to 4L [place]. No dyspnea or stridor noted after extubation. RT will continue to monitor.   Armando GangMike, Artasia Thang C 08/07/2014, 11:40 AM

## 2014-08-07 NOTE — Progress Notes (Signed)
PULMONARY / CRITICAL CARE MEDICINE   Name: Jaime [name] MRN: [id number] DOB: January 29, 1928    ADMISSION DATE:  07/30/2014 CONSULTATION DATE:  08/07/2014  REFERRING MD :  Jordan Likes  CHIEF COMPLAINT:  VDRF  INITIAL PRESENTATION:  81 y.o. F taken to [facility] 4/19 with headache after a mechanical fall.  She was found to have a left sided SDH and was transferred to [facility].  Overnight 4/20, she decompensated so on AM of 4/21, was taken to OR for craniotomy and evacuation of SDH.  She returned to ICU on the vent and PCCM was consulted for vent management.   STUDIES:  4/19 CT head [facility]): L SDH 4/19  Admit to NS service with SDH 4/20  Decompensated overnight 4/20 CT head: enlarging left SDH with increased midline shift4/21  L frontotemporal craniotomy with evacuation of SDH and placement of subdural drain.  Returned to ICU on vent, PCCM consulted 4/22  Seizure x2 4/24  EEG: no clear seizure activity  4/25 Failed SBT due to agitation and tachypnea. CXR C/W edema. Furosemide given. NGT placed by IR 4/26 more responsive. Possibly less agitated. Received fentanyl with subsequent somnolence prohibiting extubation. Pulm edema improved. Further furosemide given for persistent mild edema pattern 4/27 Passed SBT. Extubated. Tolerated initially. Further discussion with husband. DNR/I  SUBJECTIVE:   RASS 0. + F/C. Tolerating extubation initially  VITAL SIGNS: Temp:  [98.4 F (36.9 C)-100.3 F (37.9 C)] 98.7 F (37.1 C) (04/27 1154) Pulse Rate:  [80-106] 83 (04/27 1400) Resp:  [15-28] 19 (04/27 1400) BP: (94-146)/(47-68) 140/56 mmHg (04/27 1400) SpO2:  [88 %-100 %] 97 % (04/27 1400) FiO2 (%):  [30 %-40 %] 30 % (04/27 1000) Weight:  [83.6 kg (184 lb 4.9 oz)] 83.6 kg (184 lb 4.9 oz) (04/27 0412)   HEMODYNAMICS:     VENTILATOR SETTINGS: Vent Mode:  [-] PSV FiO2 (%):  [30 %-40 %] 30 % Set Rate:  [14 bmp] 14 bmp Vt Set:  [440 mL] 440 mL PEEP:  [5 cmH20] 5 cmH20 Pressure Support:  [5 cmH20-10 cmH20] 8  cmH20 Plateau Pressure:  [15 cmH20-16 cmH20] 16 cmH20  INTAKE / OUTPUT: Intake/Output      04/26 0701 - 04/27 0700 04/27 0701 - 04/28 0700   I.V. (mL/kg) 253.2 (3) 15.6 (0.2)   NG/GT 240 287.5   IV Piggyback 110    Total Intake(mL/kg) 603.2 (7.2) 303.1 (3.6)   Urine (mL/kg/hr) 2850 (1.4) 450 (0.6)   Total Output 2850 450   Net -2246.8 -146.9         PHYSICAL EXAMINATION: - baseline HOH, BLIND General: RASS 0 Neuro: MAEs, + F/C HEENT: [place]/AT, B eye prostheses Cardiovascular: Reg, no M Lungs: Clear enteriorly Abdomen: Soft, NT, +BS Ext: no edema  LABS:  CBC  Recent Labs Lab 08/03/14 0445 08/04/14 0500 08/05/14 0540  WBC 10.0 7.2 15.2*  HGB 8.8* 8.3* 9.1*  HCT 26.4* 25.2* 28.1*  PLT 192 220 264   Coag's  Recent Labs Lab 08/03/14 1350  APTT 34  INR 1.20     BMET  Recent Labs Lab 08/05/14 0540 08/06/14 0210 08/07/14 0404  NA 138 136 136  K 4.7 3.7 4.3  CL 106 96 99  CO2 BUN 12 16 50*  CREATININE 0.50 0.58 0.70  GLUCOSE 103* 120* 99   Electrolytes  Recent Labs Lab 08/03/14 0445  08/05/14 0540 08/06/14 0210 08/07/14 0404  CALCIUM 8.2*  < > 8.3* 8.4 8.0*  MG 2.2  --   --   --   --  PHOS 1.7*  --  1.9*  --   --   < > = values in this interval not displayed. ABG  Recent Labs Lab 08/02/14 1653 08/03/14 0325  PHART 7.387 7.413  PCO2ART 35.5 34.6*  PO2ART 95.0 107.0*   Glucose  Recent Labs Lab 08/06/14 1151 08/06/14 1555 08/06/14 2034 08/07/14 0006 08/07/14 0408 08/07/14 0811  GLUCAP 141* 156* 162* 183* 124* 144*   CXR: slightly improved edema pattern  ASSESSMENT / PLAN:  NEUROLOGIC A:   L SDH s/p craniotomy with evacuation Seizures 4/22 - none further HOH, BLIND Agitation, much improved P:   RASS goal: 0 Post op mgmt per NS Dexmedetomidine gtt 4/26 - stopped 4/27 Taper steroids as they might be contributing to agitation Minimize sedating meds   PULMONARY OETT 4/21 >> 4/27 A: VDRF following  craniotomy Pulm edema, resolved P:   Supp O2 Monitor closely DNI  CARDIOVASCULAR A:  Hypertension, controlled P:  Cont labetalol PRN. Goal SBP < 160 DNR if arrests  RENAL A:   No issues  P:   Monitor BMET intermittently Monitor I/Os Correct electrolytes as indicated  GASTROINTESTINAL A:   GERD Large Hiatal Hernia  Post extubation dysphagia P:   SUP: enteral famotidine Cont TFs per NGT SLP eval in next day or two in hopes of DC of NGT  HEMATOLOGIC A:   Anemia P:  DVT px: SCDs Monitor CBC intermittently Transfuse per usual ICU guidelines Was on rivaroxaban PTA - indication unclear. Holding for now  INFECTIOUS A:   No acute infection P:   Monitor temp, WBC count  ENDOCRINE A:   Mild hyperglycemia without prior dx of DM P:   SSI if glucose consistently > 180  Husband updated @ bedside  CCM time 35 mins  Billy Fischeravid Muzammil Bruins, MD ; [facility] service Mobile [phone].  After 5:30 PM or weekends, call [phone]

## 2014-08-08 LAB — BASIC METABOLIC PANEL
Anion gap: 8 (ref 5–15)
BUN: 22 mg/dL (ref 6–23)
CALCIUM: 8.7 mg/dL (ref 8.4–10.5)
CHLORIDE: 106 mmol/L (ref 96–112)
CO2: 29 mmol/L (ref 19–32)
CREATININE: 0.48 mg/dL — AB (ref 0.50–1.10)
GFR calc Af Amer: >90 mL/min (ref >90)
GFR calc non Af Amer: 88 mL/min — ABNORMAL LOW (ref >90)
GLUCOSE: 98 mg/dL (ref 70–99)
Potassium: 3.8 mmol/L (ref 3.5–5.1)
Sodium: 143 mmol/L (ref 135–145)

## 2014-08-08 LAB — GLUCOSE, CAPILLARY
GLUCOSE-CAPILLARY: 93 mg/dL (ref 70–99)
GLUCOSE-CAPILLARY: 138 mg/dL — AB (ref 70–99)
Glucose-Capillary: 140 mg/dL — ABNORMAL HIGH (ref 70–99)
Glucose-Capillary: 142 mg/dL — ABNORMAL HIGH (ref 70–99)

## 2014-08-08 MED ORDER — PANTOPRAZOLE SODIUM 40 MG IV SOLR
40.0000 mg | Freq: Every day | INTRAVENOUS | Status: DC
  Administered 2014-08-08 – 2014-08-09 (×2): 40 mg via INTRAVENOUS
  Filled 2014-08-08 (×2): qty 40

## 2014-08-08 MED ORDER — DEXAMETHASONE SODIUM PHOSPHATE 4 MG/ML IJ SOLN
2.0000 mg | INTRAMUSCULAR | Status: DC
  Administered 2014-08-08: 2 mg via INTRAVENOUS
  Filled 2014-08-08: qty 0.5

## 2014-08-08 MED ORDER — KCL IN DEXTROSE-NACL 40-5-0.45 MEQ/L-%-% IV SOLN
INTRAVENOUS | Status: DC
  Administered 2014-08-08: 11:00:00 via INTRAVENOUS
  Filled 2014-08-08 (×2): qty 1000

## 2014-08-08 NOTE — Progress Notes (Signed)
Occupational Therapy Evaluation Patient Details Name: Jaime [name] MRN: [id number] DOB: [birth year] Today's Date: 08/08/2014    History of Present Illness 81 year old female status post mechanical fall. L SDH evacuation 4/21. Intubated. Extubated 08/07/14.    Clinical Impression   PTA, pt independent with ADL and used her service dog "Phoenix" for mobility. Pt presents with significant functional decline. Requires Max A with ADL and Max A +2 with mobility. Behavior consistent with Rancho level VII (automatic/appropriate). Pt will benefit from CIR to maximize functional level of independence and return home with 24/7 S of suportive husband.     Follow Up Recommendations  CIR;Supervision/Assistance - 24 hour    Equipment Recommendations  None recommended by OT    Recommendations for Other Services Rehab consult     Precautions / Restrictions Precautions Precautions: Fall Restrictions Weight Bearing Restrictions: No      Mobility Bed Mobility Overal bed mobility: Needs Assistance Bed Mobility: Supine to Sit     Supine to sit: Mod assist     General bed mobility comments: Able to initiate and get BLE off bed but required physical assist to trasnlate trunk into upright position. Unable to maintain midline position. Affected by attention. L bias  Transfers Overall transfer level: Needs assistance   Transfers: Sit to/from Stand;Stand Pivot Transfers Sit to Stand: Max assist;+2 physical assistance Stand pivot transfers: Max assist;+2 physical assistance       General transfer comment: Poorly controlled descent to chair.     Balance Overall balance assessment: Needs assistance Sitting-balance support: Bilateral upper extremity supported;Feet supported Sitting balance-Leahy Scale: Poor Sitting balance - Comments: L bias   Standing balance support: During functional activity;Bilateral upper extremity supported Standing balance-Leahy Scale: Poor                               ADL Overall ADL's : Needs assistance/impaired Eating/Feeding: NPO   Grooming: Moderate assistance;Sitting   Upper Body Bathing: Moderate assistance;Sitting   Lower Body Bathing: Maximal assistance;Sit to/from stand   Upper Body Dressing : Maximal assistance;Sitting   Lower Body Dressing: Maximal assistance;Sit to/from stand   Toilet Transfer: +2 for physical assistance;Maximal assistance;Stand-pivot (simulated)   Toileting- Clothing Manipulation and Hygiene: Total assistance (foley)       Functional mobility during ADLs: +2 for physical assistance;Maximal assistance General ADL Comments: Significant decline in function     Vision     Perception     Praxis Praxis Praxis tested?: Deficits Deficits: Perseveration    Pertinent Vitals/Pain Pain Assessment: Faces Faces Pain Scale: Hurts little more Pain Location: unsure Pain Descriptors / Indicators: Grimacing Pain Intervention(s): Limited activity within patient's tolerance;Monitored during session;Repositioned     Hand Dominance Right   Extremity/Trunk Assessment Upper Extremity Assessment Upper Extremity Assessment: Generalized weakness;RUE deficits/detail RUE Deficits / Details: ? shoulder dysfunciton at baseline. Appears minimally weaker than L. using functionally to wash face RUE Coordination: decreased fine motor;decreased gross motor   Lower Extremity Assessment Lower Extremity Assessment: Defer to PT evaluation;Generalized weakness   Cervical / Trunk Assessment Cervical / Trunk Assessment: Other exceptions (L bias) Cervical / Trunk Exceptions: affected by attentional deficits   Communication Communication Communication: HOH;Expressive difficulties   Cognition Arousal/Alertness: Awake/alert Behavior During Therapy: Flat affect Overall Cognitive Status: Impaired/Different from baseline Area of Impairment: Orientation;Attention;Memory;Safety/judgement;Awareness;Problem  solving;Rancho level Orientation Level: Disoriented to;Time Current Attention Level: Sustained Memory: Decreased recall of precautions;Decreased short-term memory   Safety/Judgement: Decreased awareness of safety;Decreased awareness of deficits (+  2 Max A. Pt thinks she can get back into the bed independen) Awareness: Intellectual Problem Solving: Slow processing;Difficulty sequencing;Requires verbal cues;Requires tactile cues General Comments: Pt perseverating on wanting to eat   General Comments       Exercises       Shoulder Instructions      Home Living Family/patient expects to be discharged to:: Inpatient rehab Living Arrangements: Spouse/significant other                               Additional Comments: husband available 24/7.      Prior Functioning/Environment Level of Independence: Independent with assistive device(s)        Comments: uses "Phoenix" - her service dog    OT Diagnosis: Generalized weakness;Cognitive deficits;Disturbance of vision   OT Problem List: Decreased strength;Decreased activity tolerance;Impaired balance (sitting and/or standing);Decreased coordination;Decreased cognition;Decreased safety awareness;Decreased knowledge of use of DME or AE;Obesity;Impaired UE functional use;Pain   OT Treatment/Interventions: Self-care/ADL training;Therapeutic exercise;Neuromuscular education;DME and/or AE instruction;Therapeutic activities;Cognitive remediation/compensation;Visual/perceptual remediation/compensation;Patient/family education;Balance training    OT Goals(Current goals can be found in the care plan section) Acute Rehab OT Goals Patient Stated Goal: to eat something OT Goal Formulation: With patient/family Time For Goal Achievement: 08/22/14 Potential to Achieve Goals: Good ADL Goals Pt Will Perform Grooming: with set-up;with supervision;sitting Pt Will Perform Upper Body Bathing: with set-up;with supervision;sitting Pt Will  Perform Lower Body Bathing: with min assist;sit to/from stand Pt Will Perform Upper Body Dressing: with min assist;sitting Pt Will Transfer to Toilet: with min assist;bedside commode;stand pivot transfer Pt Will Perform Toileting - Clothing Manipulation and hygiene: with min assist;sitting/lateral leans Additional ADL Goal #1: Pt will demonstrate emergent awareness during ADL task in minimally distracting environment.  OT Frequency: Min 3X/week   Barriers to D/C:            Co-evaluation              End of Session Equipment Utilized During Treatment: Gait belt Nurse Communication: Mobility status;Need for lift equipment (will need skylift by nursing)  Activity Tolerance: Patient tolerated treatment well Patient left: in chair;with call bell/phone within reach;with nursing/sitter in room   Time: [id number] OT Time Calculation (min): 35 min Charges:  OT General Charges $OT Visit: 1 Procedure OT Evaluation $Initial OT Evaluation Tier I: 1 Procedure OT Treatments $Self Care/Home Management : 8-22 mins G-Codes:    Youlanda Tomassetti,HILLARY 08/08/2014, 1:32 PM   [facility] Mackinzie Vuncannon, OTR/L  [phone] 08/08/2014

## 2014-08-08 NOTE — Care Management (Signed)
UR updated . Muriah Harsha RN BSN  

## 2014-08-08 NOTE — Evaluation (Signed)
Physical Therapy Evaluation Patient Details Name: Jaime [name] MRN: [id number] DOB: [birth year] Today's Date: 08/08/2014   History of Present Illness  81 year old female status post mechanical fall. L SDH evacuation 4/21. Intubated. Extubated 08/07/14.   Clinical Impression  Pt admitted with/for evacuation of SDH post mechanical fall..  Pt currently limited functionally due to the problems listed. ( See problems list.)   Pt will benefit from PT to maximize function and safety in order to get ready for next venue listed below.     Follow Up Recommendations CIR    Equipment Recommendations  Other (comment) (TBA)    Recommendations for Other Services Rehab consult     Precautions / Restrictions Precautions Precautions: Fall Restrictions Weight Bearing Restrictions: No      Mobility  Bed Mobility Overal bed mobility: Needs Assistance Bed Mobility: Supine to Sit;Sit to Supine     Supine to sit: Mod assist Sit to supine: Mod assist;+2 for physical assistance   General bed mobility comments: Given extra time, pt moved legs to EOB, scooted hips with min assist na dcame up with truncal assist  Transfers Overall transfer level: Needs assistance   Transfers: Sit to/from Stand Sit to Stand: +2 physical assistance;Mod assist Stand pivot transfers: Max assist;+2 physical assistance       General transfer comment: Better control when givern extra time to execute.  Ambulation/Gait             General Gait Details: NT  Stairs            Wheelchair Mobility    Modified Rankin (Stroke Patients Only) Modified Rankin (Stroke Patients Only) Pre-Morbid Rankin Score: No symptoms Modified Rankin: Severe disability     Balance Overall balance assessment: No apparent balance deficits (not formally assessed) Sitting-balance support: No upper extremity supported Sitting balance-Leahy Scale: Fair Sitting balance - Comments: sat without assist EOB   Standing  balance support: During functional activity;Bilateral upper extremity supported Standing balance-Leahy Scale: Poor                               Pertinent Vitals/Pain Pain Assessment: Faces Faces Pain Scale: Hurts little more Pain Location: vague/ headache Pain Descriptors / Indicators: Aching Pain Intervention(s): Monitored during session;Repositioned;Patient requesting pain meds-RN notified    Home Living Family/patient expects to be discharged to:: Inpatient rehab Living Arrangements: Spouse/significant other Available Help at Discharge: Family             Additional Comments: husband available 24/7.    Prior Function Level of Independence: Independent with assistive device(s)         Comments: uses "Phoenix" - her service dog     Hand Dominance   Dominant Hand: Right    Extremity/Trunk Assessment   Upper Extremity Assessment: Defer to OT evaluation RUE Deficits / Details: ? shoulder dysfunciton at baseline. Appears minimally weaker than L. using functionally to wash face         Lower Extremity Assessment: Generalized weakness      Cervical / Trunk Assessment: Other exceptions (L bias)  Communication   Communication: HOH;Expressive difficulties  Cognition Arousal/Alertness: Awake/alert Behavior During Therapy: Flat affect Overall Cognitive Status: Impaired/Different from baseline Area of Impairment: Orientation;Attention;Memory;Safety/judgement;Awareness;Problem solving;Rancho level Orientation Level: Disoriented to;Time Current Attention Level: Sustained Memory: Decreased recall of precautions;Decreased short-term memory   Safety/Judgement: Decreased awareness of safety;Decreased awareness of deficits (+2 Max A. Pt thinks she can get back into the bed independen) Awareness:  Intellectual Problem Solving: Slow processing;Difficulty sequencing;Requires verbal cues;Requires tactile cues General Comments: Pt perseverating on wanting to  eat    General Comments      Exercises        Assessment/Plan    PT Assessment Patient needs continued PT services  PT Diagnosis Difficulty walking;Generalized weakness   PT Problem List Decreased strength;Decreased activity tolerance;Decreased balance;Decreased mobility;Decreased coordination;Decreased knowledge of use of DME  PT Treatment Interventions DME instruction;Gait training;Functional mobility training;Therapeutic activities;Balance training;Patient/family education;Neuromuscular re-education   PT Goals (Current goals can be found in the Care Plan section) Acute Rehab PT Goals Patient Stated Goal: to eat something PT Goal Formulation: With patient Time For Goal Achievement: 08/22/14 Potential to Achieve Goals: Good    Frequency Min 3X/week   Barriers to discharge        Co-evaluation               End of Session   Activity Tolerance: Patient tolerated treatment well Patient left: in bed;with call bell/phone within reach Nurse Communication: Mobility status         Time: 1450-1510 PT Time Calculation (min) (ACUTE ONLY): 20 min   Charges:   PT Evaluation $Initial PT Evaluation Tier I: 1 Procedure     PT G Codes:        Yessika Otte, Eliseo GumKenneth V 08/08/2014, 4:44 PM 08/08/2014  [place] BingKen Derrick Orris, PT [phone]  (pager)
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
## 2014-08-08 NOTE — Progress Notes (Signed)
Physical medicine and rehabilitation consult has been requested chart reviewed. Patient TBI/SDH status post craniotomy. She was extubated 08/07/2014. Will await physical and occupational therapy evaluations to be completed in follow-up at that time with appropriate recommendations.
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
## 2014-08-08 NOTE — Progress Notes (Signed)
The patient looks good this morning. She has been extubated. She opens her eyes to voice. She is oriented to person place and time. She answers questions appropriately. Strength is equal bilaterally. She complains of some headache. Wound healing well. She is afebrile. Her vitals are stable. Urine output is good. Electrolytes stable.  Progressing well. Mobilize with therapy today. Probable discharge from ICU tomorrow.

## 2014-08-08 NOTE — Progress Notes (Signed)
PULMONARY / CRITICAL CARE MEDICINE   Name: Jaime [name] MRN: [id number] DOB: 05/18/1927    ADMISSION DATE:  07/30/2014 CONSULTATION DATE:  08/08/2014  REFERRING MD :  Jordan Likes  CHIEF COMPLAINT:  VDRF  INITIAL PRESENTATION:  81 y.o. F taken to [facility] 4/19 with headache after a mechanical fall.  She was found to have a left sided SDH and was transferred to [facility].  Overnight 4/20, she decompensated so on AM of 4/21, was taken to OR for craniotomy and evacuation of SDH.  She returned to ICU on the vent and PCCM was consulted for vent management.   STUDIES:  4/19 CT head [facility]): L SDH 4/19  Admit to NS service with SDH 4/20  Decompensated overnight 4/20 CT head: enlarging left SDH with increased midline shift4/21  L frontotemporal craniotomy with evacuation of SDH and placement of subdural drain.  Returned to ICU on vent, PCCM consulted 4/22  Seizure x2 4/24  EEG: no clear seizure activity  4/25 Failed SBT due to agitation and tachypnea. CXR C/W edema. Furosemide given. NGT placed by IR 4/26 more responsive. Possibly less agitated. Received fentanyl with subsequent somnolence prohibiting extubation. Pulm edema improved. Further furosemide given for persistent mild edema pattern 4/27 Passed SBT. Extubated. Tolerated initially. Further discussion with husband. DNR/I 4/28 Tolerating extubation. NGT removed by pt. CIR consult placed. SLP eval ordered  SUBJECTIVE:   Tolerating extubation. + F/C. MAEs  VITAL SIGNS: Temp:  [98.2 F (36.8 C)-98.9 F (37.2 C)] 98.4 F (36.9 C) (04/28 0749) Pulse Rate:  [71-96] 78 (04/28 0700) Resp:  [16-25] 22 (04/28 0700) BP: (102-148)/(34-85) 148/55 mmHg (04/28 0700) SpO2:  [94 %-100 %] 98 % (04/28 0700) FiO2 (%):  [30 %] 30 % (04/27 1000)   HEMODYNAMICS:     VENTILATOR SETTINGS: Vent Mode:  [-] PSV FiO2 (%):  [30 %] 30 % PEEP:  [5 cmH20] 5 cmH20 Pressure Support:  [8 cmH20] 8 cmH20  INTAKE / OUTPUT: Intake/Output      04/27 0701 - 04/28 0700  04/28 0701 - 04/29 0700   I.V. (mL/kg) 155.6 (1.9)    NG/GT 297.5    IV Piggyback 160    Total Intake(mL/kg) 613.1 (7.3)    Urine (mL/kg/hr) 1700 (0.8)    Stool 0 (0)    Total Output 1700     Net -1086.9          Stool Occurrence 1 x     PHYSICAL EXAMINATION: - baseline HOH, BLIND General: RASS 0 Neuro: MAEs, + F/C HEENT: [place]/AT, B eye prostheses Cardiovascular: Reg, no M Lungs: Clear enteriorly Abdomen: Soft, NT, +BS Ext: no edema  LABS:  CBC  Recent Labs Lab 08/03/14 0445 08/04/14 0500 08/05/14 0540  WBC 10.0 7.2 15.2*  HGB 8.8* 8.3* 9.1*  HCT 26.4* 25.2* 28.1*  PLT 192 220 264   Coag's  Recent Labs Lab 08/03/14 1350  APTT 34  INR 1.20     BMET  Recent Labs Lab 08/06/14 0210 08/07/14 0404 08/08/14 0545  NA 136 136 143  K 3.7 4.3 3.8  CL 96 99 106  CO2 BUN 16 50* 22  CREATININE 0.58 0.70 0.48*  GLUCOSE 120* 99 98   Electrolytes  Recent Labs Lab 08/03/14 0445  08/05/14 0540 08/06/14 0210 08/07/14 0404 08/08/14 0545  CALCIUM 8.2*  < > 8.3* 8.4 8.0* 8.7  MG 2.2  --   --   --   --   --   PHOS 1.7*  --  1.9*  --   --   --   < > = values in this interval not displayed. ABG  Recent Labs Lab 08/02/14 1653 08/03/14 0325  PHART 7.387 7.413  PCO2ART 35.5 34.6*  PO2ART 95.0 107.0*   Glucose  Recent Labs Lab 08/06/14 2034 08/07/14 0006 08/07/14 0408 08/07/14 0811 08/07/14 1602 08/08/14 0006  GLUCAP 162* 183* 124* 144* 129* 140*   CXR: NNF  ASSESSMENT / PLAN:  NEUROLOGIC A:   L SDH s/p craniotomy with evacuation Seizures 4/22 - none further HOH, BLIND Agitation, much improved P:   RASS goal: 0 Post op mgmt per NS Taper steroids to off Minimize sedating meds  CIR eval requested  PULMONARY OETT 4/21 >> 4/27 A: VDRF following craniotomy Pulm edema, resolved P:   Supp O2 DNI  CARDIOVASCULAR A:  Hypertension, controlled P:  Cont labetalol PRN Goal SBP < 160 DNR if arrests  RENAL A:   No issues   P:   Monitor BMET intermittently Monitor I/Os Correct electrolytes as indicated  GASTROINTESTINAL A:   GERD Large Hiatal Hernia  Chronic PPI use Post extubation dysphagia P:   SUP: IV PPI - change to PO if passes swallow eval Maintenance IVFs until able to take POs  SLP eval today Advance diet as able  HEMATOLOGIC A:   Anemia, improving P:  DVT px: SCDs Monitor CBC intermittently Transfuse per usual ICU guidelines Was on rivaroxaban PTA - indication unclear. Holding for now  INFECTIOUS A:   No acute infection P:   Monitor temp, WBC count  ENDOCRINE A:   Mild hyperglycemia without prior dx of DM P:   SSI if glucose consistently > 180  Husband updated @ bedside    Billy Fischeravid Alissah Redmon, MD ; [facility] service Mobile [phone].  After 5:30 PM or weekends, call [phone]

## 2014-08-09 ENCOUNTER — Inpatient Hospital Stay (HOSPITAL_COMMUNITY)
Admission: RE | Admit: 2014-08-09 | Discharge: 2014-08-23 | DRG: 092 | Disposition: A | Discharge status: Home Health | Payer: Medicare PPO | Source: Intra-hospital | Attending: Physical Medicine & Rehabilitation | Admitting: Physical Medicine & Rehabilitation

## 2014-08-09 DIAGNOSIS — Z96651 Presence of right artificial knee joint: Secondary | ICD-10-CM | POA: Diagnosis present

## 2014-08-09 DIAGNOSIS — S065X2S Traumatic subdural hemorrhage with loss of consciousness of 31 minutes to 59 minutes, sequela: Secondary | ICD-10-CM | POA: Diagnosis not present

## 2014-08-09 DIAGNOSIS — S065X0S Traumatic subdural hemorrhage without loss of consciousness, sequela: Principal | ICD-10-CM

## 2014-08-09 DIAGNOSIS — J209 Acute bronchitis, unspecified: Secondary | ICD-10-CM | POA: Diagnosis not present

## 2014-08-09 DIAGNOSIS — R059 Cough, unspecified: Secondary | ICD-10-CM

## 2014-08-09 DIAGNOSIS — W19XXXS Unspecified fall, sequela: Secondary | ICD-10-CM | POA: Diagnosis present

## 2014-08-09 DIAGNOSIS — S065X4S Traumatic subdural hemorrhage with loss of consciousness of 6 hours to 24 hours, sequela: Secondary | ICD-10-CM | POA: Diagnosis not present

## 2014-08-09 DIAGNOSIS — S065X9S Traumatic subdural hemorrhage with loss of consciousness of unspecified duration, sequela: Secondary | ICD-10-CM | POA: Diagnosis not present

## 2014-08-09 DIAGNOSIS — J40 Bronchitis, not specified as acute or chronic: Secondary | ICD-10-CM | POA: Diagnosis not present

## 2014-08-09 DIAGNOSIS — R05 Cough: Secondary | ICD-10-CM

## 2014-08-09 DIAGNOSIS — D649 Anemia, unspecified: Secondary | ICD-10-CM | POA: Diagnosis not present

## 2014-08-09 DIAGNOSIS — G8929 Other chronic pain: Secondary | ICD-10-CM | POA: Diagnosis not present

## 2014-08-09 DIAGNOSIS — S065X3D Traumatic subdural hemorrhage with loss of consciousness of 1 hour to 5 hours 59 minutes, subsequent encounter: Secondary | ICD-10-CM | POA: Diagnosis not present

## 2014-08-09 DIAGNOSIS — M79671 Pain in right foot: Secondary | ICD-10-CM

## 2014-08-09 DIAGNOSIS — Z87891 Personal history of nicotine dependence: Secondary | ICD-10-CM

## 2014-08-09 DIAGNOSIS — H540 Blindness, both eyes: Secondary | ICD-10-CM | POA: Diagnosis present

## 2014-08-09 DIAGNOSIS — S065X1D Traumatic subdural hemorrhage with loss of consciousness of 30 minutes or less, subsequent encounter: Secondary | ICD-10-CM | POA: Diagnosis not present

## 2014-08-09 DIAGNOSIS — Z8614 Personal history of Methicillin resistant Staphylococcus aureus infection: Secondary | ICD-10-CM

## 2014-08-09 DIAGNOSIS — Z970 Presence of artificial eye: Secondary | ICD-10-CM | POA: Diagnosis not present

## 2014-08-09 DIAGNOSIS — R1313 Dysphagia, pharyngeal phase: Secondary | ICD-10-CM | POA: Diagnosis present

## 2014-08-09 DIAGNOSIS — D62 Acute posthemorrhagic anemia: Secondary | ICD-10-CM | POA: Diagnosis present

## 2014-08-09 DIAGNOSIS — S065X9A Traumatic subdural hemorrhage with loss of consciousness of unspecified duration, initial encounter: Secondary | ICD-10-CM | POA: Insufficient documentation

## 2014-08-09 DIAGNOSIS — S065X3S Traumatic subdural hemorrhage with loss of consciousness of 1 hour to 5 hours 59 minutes, sequela: Secondary | ICD-10-CM | POA: Diagnosis not present

## 2014-08-09 DIAGNOSIS — K219 Gastro-esophageal reflux disease without esophagitis: Secondary | ICD-10-CM | POA: Diagnosis present

## 2014-08-09 DIAGNOSIS — Z87828 Personal history of other (healed) physical injury and trauma: Secondary | ICD-10-CM | POA: Diagnosis present

## 2014-08-09 DIAGNOSIS — S065X2D Traumatic subdural hemorrhage with loss of consciousness of 31 minutes to 59 minutes, subsequent encounter: Secondary | ICD-10-CM | POA: Diagnosis not present

## 2014-08-09 DIAGNOSIS — S065XAA Traumatic subdural hemorrhage with loss of consciousness status unknown, initial encounter: Secondary | ICD-10-CM | POA: Insufficient documentation

## 2014-08-09 LAB — GLUCOSE, CAPILLARY
GLUCOSE-CAPILLARY: 112 mg/dL — AB (ref 70–99)
Glucose-Capillary: 127 mg/dL — ABNORMAL HIGH (ref 70–99)
Glucose-Capillary: 172 mg/dL — ABNORMAL HIGH (ref 70–99)

## 2014-08-09 MED ORDER — PANTOPRAZOLE SODIUM 40 MG PO TBEC
40.0000 mg | DELAYED_RELEASE_TABLET | Freq: Every day | ORAL | Status: DC
  Administered 2014-08-09: 40 mg via ORAL
  Filled 2014-08-09: qty 1

## 2014-08-09 MED ORDER — LEVETIRACETAM 500 MG PO TABS
1000.0000 mg | ORAL_TABLET | Freq: Two times a day (BID) | ORAL | Status: DC
  Administered 2014-08-09: 1000 mg via ORAL
  Filled 2014-08-09: qty 2

## 2014-08-09 MED ORDER — BISACODYL 10 MG RE SUPP: 10.0000 mg | Freq: Every day | RECTAL | Status: DC | PRN

## 2014-08-09 MED ORDER — ACETAMINOPHEN 325 MG PO TABS
325.0000 mg | ORAL_TABLET | ORAL | Status: DC | PRN
  Administered 2014-08-12 – 2014-08-13 (×2): 650 mg via ORAL
  Filled 2014-08-09 (×2): qty 2

## 2014-08-09 MED ORDER — LEVETIRACETAM 500 MG PO TABS
1000.0000 mg | ORAL_TABLET | Freq: Two times a day (BID) | ORAL | Status: DC
  Administered 2014-08-10 – 2014-08-23 (×27): 1000 mg via ORAL
  Filled 2014-08-09 (×29): qty 2

## 2014-08-09 MED ORDER — ONDANSETRON HCL 4 MG/2ML IJ SOLN: 4.0000 mg | Freq: Four times a day (QID) | INTRAMUSCULAR | Status: DC | PRN

## 2014-08-09 MED ORDER — ENSURE ENLIVE PO LIQD
237.0000 mL | ORAL | Status: DC
  Administered 2014-08-09: 237 mL via ORAL

## 2014-08-09 MED ORDER — PANTOPRAZOLE SODIUM 40 MG PO TBEC
40.0000 mg | DELAYED_RELEASE_TABLET | Freq: Every day | ORAL | Status: DC
  Administered 2014-08-10 – 2014-08-23 (×14): 40 mg via ORAL
  Filled 2014-08-09 (×14): qty 1

## 2014-08-09 MED ORDER — ENSURE ENLIVE PO LIQD
237.0000 mL | ORAL | Status: DC
  Administered 2014-08-14 – 2014-08-21 (×5): 237 mL via ORAL

## 2014-08-09 MED ORDER — ONDANSETRON HCL 4 MG PO TABS: 4.0000 mg | ORAL_TABLET | Freq: Four times a day (QID) | ORAL | Status: DC | PRN

## 2014-08-09 MED ORDER — ALBUTEROL SULFATE (2.5 MG/3ML) 0.083% IN NEBU
2.5000 mg | INHALATION_SOLUTION | RESPIRATORY_TRACT | Status: DC | PRN
  Administered 2014-08-13 – 2014-08-18 (×4): 2.5 mg via RESPIRATORY_TRACT
  Filled 2014-08-09 (×4): qty 3

## 2014-08-09 MED ORDER — SORBITOL 70 % SOLN
30.0000 mL | Freq: Every day | Status: DC | PRN
  Administered 2014-08-20: 30 mL via ORAL
  Filled 2014-08-09: qty 30
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
## 2014-08-09 NOTE — Progress Notes (Signed)
NUTRITION FOLLOW UP  Intervention:   -Ensure Enlive po daily, each supplement provides 350 kcal and 20 grams of protein  Nutrition Dx:   Inadequate oral intake related to inability to eat as evidenced by NPO status; resolved  Predicted suboptimal nutrient intake related to dysphagia as evidenced by neurological status.   Goal:   Pt will meet >90% of estimated nutritional needs  Monitor:   PO/ supplement intake, labs, weight changes, I/O's  Assessment:   81 y.o. F taken to [facility] 4/19 with headache after a mechanical fall. She was found to have a left sided SDH and was transferred to [facility]. Overnight 4/20, she decompensated so on AM of 4/21, was taken to OR for craniotomy and evacuation of SDH.  Pt extubated on 08/07/14. Nutritional needs re-estimated due to change in status. Pt was on a dysphagia 1 diet with pudding thick liquids this AM. Speech saw pt prior to RD visit and advanced pt to a dysphagia 3 diet with nectar thick liquids for lunch.  Pt reports improvement in swallow function. Both pt and husband reports good appetite- ate all of her breakfast this AM except oatmeal. She is looking forward to lunch and is grateful for diet advancement.  Discussed importance of good PO intake to promote healing. Pt and husband express appreciation for visit, but deny further nutritional needs at this time. Plan is to d/c to CIR once stable.  Labs reviewed. BUN: 50, Calcium: 8.0, Phos: 1.9. CBGS: 112-132.  Height: Ht Readings from Last 1 Encounters:  07/30/14  (1.575 m)    Weight Status:   Wt Readings from Last 1 Encounters:  08/07/14 184 lb 4.9 oz (83.6 kg)    Re-estimated needs:  Kcal: 1600-1800 Protein: 75-85 grams Fluid: 1.6-1.8 L  Skin: rt skin tear, head incision  Diet Order: DIET DYS 3 Room service appropriate?: Yes; Fluid consistency:: Nectar Thick   Intake/Output Summary (Last 24 hours) at 08/09/14 0955 Last data filed at 08/09/14 0600  Gross per 24 hour  Intake    1665 ml  Output      0 ml  Net   1665 ml    Last BM: 08/09/14   Labs:   Recent Labs Lab 08/03/14 0445  08/05/14 0540 08/06/14 0210 08/07/14 0404 08/08/14 0545  NA 140  < > 138 136 136 143  K 4.3  < > 4.7 3.7 4.3 3.8  CL 109  < > 106 96 99 106  CO2 22  < > BUN 18  < > 12 16 50* 22  CREATININE 0.49*  < > 0.50 0.58 0.70 0.48*  CALCIUM 8.2*  < > 8.3* 8.4 8.0* 8.7  MG 2.2  --   --   --   --   --   PHOS 1.7*  --  1.9*  --   --   --   GLUCOSE 132*  < > 103* 120* 99 98  < > = values in this interval not displayed.  CBG (last 3)   Recent Labs  08/08/14 1543 08/08/14 2345 08/09/14 0750  GLUCAP 138* 142* 112*    Scheduled Meds: . antiseptic oral rinse  7 mL Mouth Rinse q12n4p  . chlorhexidine  15 mL Mouth Rinse BID  . dexamethasone  2 mg Intravenous Q24H  . levETIRAcetam  1,000 mg Intravenous Q12H  . pantoprazole (PROTONIX) IV  40 mg Intravenous Daily  . phenytoin (DILANTIN) IV  100 mg Intravenous 3 times per day  .  sodium chloride  10-40 mL Intracatheter Q12H    Continuous Infusions: . dextrose 5 % and 0.45 % NaCl with KCl 40 mEq/L 50 mL/hr at 08/08/14 1900    Charell Faulk A. Mayford KnifeWilliams, RD, LDN, CDE Pager: [phone] After hours Pager: [phone]

## 2014-08-09 NOTE — Interval H&P Note (Signed)
Jaime Spittleatricia S Wilinski was admitted today to Inpatient Rehabilitation with the diagnosis of  SDH s/p craniotomy.  The patient's history has been reviewed, patient examined, and there is no change in status.  Patient continues to be appropriate for intensive inpatient rehabilitation.  I have reviewed the patient's chart and labs.  Questions were answered to the patient's satisfaction.  Miran Kautzman T 08/09/2014, 11:45 PM

## 2014-08-09 NOTE — Progress Notes (Signed)
Rehab admissions - Evaluated for possible admission.  I have called and [facility]hab [facility].  I attempted twice to meet with patient's spouse, but he was on the phone.  Once I hear back from insurance carrier, I will follow up with all.  Call me for questions.  #[phone]

## 2014-08-09 NOTE — Progress Notes (Signed)
Patient arrived to [id number]. Patient denies pain, alert and oriented x4, no dressing covering incision, incision is dry and intact, patient's sacral area reddened, blanchable, sacral dressing applied, patient positioned supine, SCDs applied. Patient's stomach distended, MD aware. Patient on bed pan, educated regarding no straining d/t craniotomy. Patient's husband and dog at bedside. Bed alarm on. Call bell within patient's reach. Will continue to monitor closely.
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
## 2014-08-09 NOTE — Progress Notes (Signed)
Rehab admissions - I have approval for acute inpatient rehab admission for today.  I met with patient and her husband.  Husband agreeable to acute inpatient rehab admission.  Bed available and will admit to acute inpatient rehab today.  Will need discharge order from attending MD and then can admit.  Call me for questions.  #[phone]
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
## 2014-08-09 NOTE — Discharge Summary (Signed)
Physician Discharge Summary  Patient ID: Jaime [name] MRN: [id number] DOB/AGE: February 24, 1928 81 y.o.  Admit date: 07/30/2014 Discharge date: 08/09/2014  Admission Diagnoses:  Discharge Diagnoses:  Principal Problem:   Traumatic brain injury Active Problems:   Subdural hemorrhage following injury   Subdural hematoma   Acute respiratory failure with hypoxemia   Acute respiratory failure   Encounter for orogastric (OG) tube placement   Blind   HOH (hard of hearing)   Agitation   Encephalopathy acute   Discharged Condition: good  Hospital Course: The patient was admitted to the hospital , for treatment and evaluation of a left side  The patient was initially observed in the ICU.  She worsen neurologically.  Subdural hematoma was found to be enlarged on CT scan.  She was taken emergently the operating room where she underwent craniotomy and evacuation of hematoma.   The patient did fairly well postoperatively.  She began to recover neurologically.  She progressed extubation.  Currently she is awake and talking.  She is moving all 4 extremities welln therapy.  Follow-up head CT scan demonstrates resolution of her subdural hematoma.Plan is for discharge to rehabilitation.  Consults:   Significant Diagnostic Studies:   Treatments:   Discharge Exam: Blood pressure 141/58, pulse 79, temperature 98.3 F (36.8 C), temperature source Oral, resp. rate 18, height 5\' 2"  (1.575 m), weight 83.6 kg (184 lb 4.9 oz), SpO2 97 %. Patient is awake and alert.  She is oriented and reasonably appropriate.  Her motor and sensory function are 5 over 5 bilaterally.  Her wound is healing well.  Chest and abdomen are benign.  Disposition: 03-Skilled Nursing Facility     Medication List    ASK your doctor about these medications        ALPRAZolam 0.25 MG tablet  Commonly known as:  XANAX  Take 0.25 mg by mouth at bedtime as needed for anxiety or sleep.     BIOFREEZE EX  Apply 1 application  topically daily as needed (pain).     Cholecalciferol 1000 UNITS tablet  Take 1,000 Units by mouth daily.     CIPRODEX otic suspension  Generic drug:  ciprofloxacin-dexamethasone  Place 4 drops into both ears daily as needed (wax buildup).     ferrous sulfate 325 (65 FE) MG tablet  Take by mouth daily.     fluticasone 50 MCG/ACT nasal spray  Commonly known as:  FLONASE  Place 1 spray into the nose daily.     fluticasone 50 MCG/ACT nasal spray  Commonly known as:  FLONASE  Place 1 spray into both nostrils daily.     ibuprofen 200 MG tablet  Commonly known as:  ADVIL,MOTRIN  Take 200 mg by mouth every 6 (six) hours as needed.     levalbuterol 0.63 MG/3ML nebulizer solution  Commonly known as:  XOPENEX  Take 3 mLs (0.63 mg total) by nebulization every 6 (six) hours as needed for wheezing or shortness of breath.     Magnesium 250 MG Tabs  Take 400 mg by mouth daily.     magnesium oxide 400 MG tablet  Commonly known as:  MAG-OX  Take 400 mg by mouth daily.     omeprazole 20 MG capsule  Commonly known as:  PRILOSEC  Take 20 mg by mouth daily.     potassium chloride SA 20 MEQ tablet  Commonly known as:  K-DUR,KLOR-CON  Take 20 mEq by mouth daily as needed (deficiency).     ranitidine 150 MG tablet  Commonly known as:  ZANTAC  Take 150 mg by mouth daily as needed for heartburn.     senna 8.6 MG tablet  Commonly known as:  SENOKOT  Take 1 tablet by mouth daily as needed for constipation.     TOVIAZ 8 MG Tb24 tablet  Generic drug:  fesoterodine  Take 8 mg by mouth daily.     VENTOLIN HFA 108 (90 BASE) MCG/ACT inhaler  Generic drug:  albuterol  Inhale 2 puffs into the lungs every 4 (four) hours as needed for wheezing or shortness of breath.           Follow-up Information    Follow up with Temple Pacini, MD.   Specialty:  Neurosurgery   Contact information:   [address] [place] [address] [phone]       Signed: Temple Pacini 08/09/2014, 3:31 PM

## 2014-08-09 NOTE — Progress Notes (Signed)
Speech Language Pathology Treatment: Dysphagia;Cognitive-Linquistic  Patient Details Name: Jaime [name] MRN: [id number] DOB: November 25, 1927 Today's Date: 08/09/2014 Time: [id number] SLP Time Calculation (min) (ACUTE ONLY): 25 min  Assessment / Plan / Recommendation Clinical Impression  Pt demonstrates improvement in swallow function today with no immediate signs of aspiration and delayed cough with thin liquids only. Pt able to masticate regular solids well. Discussed precautions and signs of aspiration with husband. SLP also provided opportunities for basic functional problem solving and sustained attention, which pt was able to complete appropriately with moderate assist given visual deficits. Pt initiating basic needs with decreased perseveration today. Will continue efforts, recommend CIR at d/c.    HPI HPI: 81 y.o. F [facility] with headache after a mechanical fall. She was found to have a left sided SDH and [facility] Overnight 4/20, she decompensated so on AM of 4/21, was taken to OR for craniotomy and evacuation of SDH. Intubated 4/20 to 4/27.    Pertinent Vitals    SLP Plan  Continue with current plan of care    Recommendations Diet recommendations: Dysphagia 3 (mechanical soft);Nectar-thick liquid Liquids provided via: Cup Medication Administration: Whole meds with puree Supervision: Full supervision/cueing for compensatory strategies;Patient able to self feed Compensations: Slow rate;Small sips/bites Postural Changes and/or Swallow Maneuvers: Seated upright 90 degrees              General recommendations: Rehab consult Follow up Recommendations: Inpatient Rehab Plan: Continue with current plan of care    GO    [facility] Surgery Center LPBonnie Antwan Bribiesca, MA CCC-SLP [phone]  Claudine MoutonDeBlois, Blondina Coderre Caroline 08/09/2014, 9:49 AM
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
## 2014-08-09 NOTE — Progress Notes (Signed)
Report given to RN at inpatient rehab.

## 2014-08-09 NOTE — Progress Notes (Signed)
Doing very well today. Patient has tolerated extubation without incident. She is participating in therapies. She is tolerating an oral diet. She denies pain.  Afebrile. Vitals are stable. Urine output good. Patient awakens easily. Motor and sensory function intact bilaterally. Wound clean and dry.  Overall progressing well following craniotomy for subdural hematoma. Continue efforts at mobilization. Patient would benefit from inpatient rehabilitation. Consult be sent today. Patient ready for my standpoint for inpatient rehabilitation whenever bed available.

## 2014-08-10 ENCOUNTER — Inpatient Hospital Stay (HOSPITAL_COMMUNITY): Payer: Medicare PPO | Admitting: Physical Therapy

## 2014-08-10 ENCOUNTER — Inpatient Hospital Stay (HOSPITAL_COMMUNITY): Payer: Medicare PPO

## 2014-08-10 ENCOUNTER — Inpatient Hospital Stay (HOSPITAL_COMMUNITY): Payer: Medicare PPO | Admitting: Occupational Therapy

## 2014-08-10 ENCOUNTER — Inpatient Hospital Stay (HOSPITAL_COMMUNITY): Payer: BC Managed Care – PPO | Admitting: Speech Pathology

## 2014-08-10 DIAGNOSIS — S065X2S Traumatic subdural hemorrhage with loss of consciousness of 31 minutes to 59 minutes, sequela: Secondary | ICD-10-CM

## 2014-08-10 DIAGNOSIS — D649 Anemia, unspecified: Secondary | ICD-10-CM

## 2014-08-10 MED ORDER — CETYLPYRIDINIUM CHLORIDE 0.05 % MT LIQD
7.0000 mL | Freq: Two times a day (BID) | OROMUCOSAL | Status: DC
  Administered 2014-08-10 – 2014-08-21 (×15): 7 mL via OROMUCOSAL

## 2014-08-10 MED ORDER — CETYLPYRIDINIUM CHLORIDE 0.05 % MT LIQD
7.0000 mL | Freq: Two times a day (BID) | OROMUCOSAL | Status: DC
  Administered 2014-08-10 – 2014-08-23 (×20): 7 mL via OROMUCOSAL

## 2014-08-10 MED ORDER — SODIUM CHLORIDE 0.9 % IJ SOLN
10.0000 mL | INTRAMUSCULAR | Status: DC | PRN
  Administered 2014-08-10: 20 mL
  Administered 2014-08-11 – 2014-08-12 (×2): 10 mL
  Administered 2014-08-12: 20 mL
  Administered 2014-08-13 – 2014-08-15 (×3): 10 mL
  Administered 2014-08-15: 20 mL
  Administered 2014-08-16 – 2014-08-17 (×4): 10 mL
  Administered 2014-08-17 – 2014-08-19 (×2): 20 mL
  Filled 2014-08-10 (×13): qty 40
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
## 2014-08-10 NOTE — Plan of Care (Signed)
Problem: RH Pre-functional (Specify) Goal: RH LTG Pre-functional (Specify) Pt will ambulate up/down ramp with LRAD, service dog and min A for home entry/exit

## 2014-08-11 DIAGNOSIS — S065X2D Traumatic subdural hemorrhage with loss of consciousness of 31 minutes to 59 minutes, subsequent encounter: Secondary | ICD-10-CM

## 2014-08-11 MED ORDER — ALTEPLASE 2 MG IJ SOLR
2.0000 mg | Freq: Once | INTRAMUSCULAR | Status: AC
  Administered 2014-08-11: 2 mg
  Filled 2014-08-11: qty 2
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
## 2014-08-12 ENCOUNTER — Inpatient Hospital Stay (HOSPITAL_COMMUNITY): Payer: BC Managed Care – PPO | Admitting: Rehabilitation

## 2014-08-12 ENCOUNTER — Inpatient Hospital Stay (HOSPITAL_COMMUNITY): Payer: BC Managed Care – PPO | Admitting: Speech Pathology

## 2014-08-12 ENCOUNTER — Inpatient Hospital Stay (HOSPITAL_COMMUNITY): Payer: Medicare PPO | Admitting: Physical Therapy

## 2014-08-12 ENCOUNTER — Inpatient Hospital Stay (HOSPITAL_COMMUNITY): Payer: Medicare PPO

## 2014-08-12 ENCOUNTER — Inpatient Hospital Stay (HOSPITAL_COMMUNITY): Payer: BC Managed Care – PPO | Admitting: Occupational Therapy

## 2014-08-12 LAB — COMPREHENSIVE METABOLIC PANEL
ALK PHOS: 65 U/L (ref 38–126)
ALT: 31 U/L (ref 14–54)
AST: 20 U/L (ref 15–41)
Albumin: 2.5 g/dL — ABNORMAL LOW (ref 3.5–5.0)
Anion gap: 8 (ref 5–15)
BILIRUBIN TOTAL: 0.3 mg/dL (ref 0.3–1.2)
BUN: 9 mg/dL (ref 6–20)
CO2: 27 mmol/L (ref 22–32)
Calcium: 8.5 mg/dL — ABNORMAL LOW (ref 8.9–10.3)
Chloride: 103 mmol/L (ref 101–111)
Creatinine, Ser: 0.52 mg/dL (ref 0.44–1.00)
Glucose, Bld: 122 mg/dL — ABNORMAL HIGH (ref 70–99)
POTASSIUM: 3.9 mmol/L (ref 3.5–5.1)
Sodium: 138 mmol/L (ref 135–145)
Total Protein: 5.8 g/dL — ABNORMAL LOW (ref 6.5–8.1)

## 2014-08-12 LAB — CBC WITH DIFFERENTIAL/PLATELET
BASOS ABS: 0.1 10*3/uL (ref 0.0–0.1)
Basophils Relative: 1 % (ref 0–1)
Eosinophils Absolute: 0.1 10*3/uL (ref 0.0–0.7)
Eosinophils Relative: 1 % (ref 0–5)
HCT: 27.9 % — ABNORMAL LOW (ref 36.0–46.0)
HEMOGLOBIN: 8.7 g/dL — AB (ref 12.0–15.0)
LYMPHS ABS: 2 10*3/uL (ref 0.7–4.0)
Lymphocytes Relative: 19 % (ref 12–46)
MCH: 30.2 pg (ref 26.0–34.0)
MCHC: 31.2 g/dL (ref 30.0–36.0)
MCV: 96.9 fL (ref 78.0–100.0)
MONO ABS: 0.9 10*3/uL (ref 0.1–1.0)
Monocytes Relative: 9 % (ref 3–12)
NEUTROS ABS: 7.3 10*3/uL (ref 1.7–7.7)
NEUTROS PCT: 70 % (ref 43–77)
PLATELETS: 319 10*3/uL (ref 150–400)
RBC: 2.88 MIL/uL — ABNORMAL LOW (ref 3.87–5.11)
RDW: 21.7 % — AB (ref 11.5–15.5)
WBC: 10.4 10*3/uL (ref 4.0–10.5)

## 2014-08-12 MED ORDER — RESOURCE THICKENUP CLEAR PO POWD
ORAL | Status: DC | PRN
  Filled 2014-08-12: qty 125
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
## 2014-08-12 NOTE — Progress Notes (Signed)
Occupational Therapy Session Note  Patient Details  Name: Jaime Spittleatricia S [name] MRN: [id number] Date of Birth: Jan 07, 1928  Today's Date: 08/12/2014 OT Individual Time: 1100-1158 OT Individual Time Calculation (min): 58 min    Short Term Goals: Week 1:  OT Short Term Goal 1 (Week 1): Pt. will bathe with min assist OT Short Term Goal 2 (Week 1): pt. will dress with min assist OT Short Term Goal 3 (Week 1): Pt. will transfer to toilet with min assist OT Short Term Goal 4 (Week 1): pt will stand during grooming with min assit  Skilled Therapeutic Interventions/Progress Updates:    Pt seen for ADL retraining with focus on functional transfers, standing balance, sequencing, attention, and problem solving. Pt received supine in bed agreeable to therapy. Pt completed supine>sit with mod A and max cues for sequencing. Pt completed stand pivot transfer bed>w/c and w/c<>toilet with min A. Pt required min A for standing balance during toilet hygiene. Completed bathing sit<>stand at sink with mod cues for sequencing and problem solving. Pt stood approx 30 seconds during oral care and buttocks hygiene with min A before fatiguing and requiring rest break. Pt noted to have skin breakdown on L stomach fold (notified RN). At end of session pt requesting to return to bed despite encouragement to remain in w/c for lunch. Pt demonstrating poor posture in w/c therefore completed stand pivot transfer w/c>bed with min A. Pt left with husband present and all needs in reach.   Therapy Documentation Precautions:  Precautions Precautions: Fall Precaution Comments: Pt blind in both eyes; bilat prosthetic eyes  Restrictions Weight Bearing Restrictions: No General:   Vital Signs:   Pain: Pain Assessment Pain Assessment: 0-10 Pain Score: 6  Pain Type: Acute pain Pain Location: Back Pain Intervention(s): Repositioned;Emotional support;Other (Comment) (pt reported she had received pain meds recently)  See FIM for  current functional status  Therapy/Group: Individual Therapy  Daneil Danerkinson, Minal Stuller N 08/12/2014, 12:04 PM

## 2014-08-12 NOTE — Progress Notes (Signed)
Physical Therapy Session Note  Patient Details  Name: Jaime Spittleatricia S Drumheller MRN: [id number] Date of Birth: Oct 09, 1927  Today's Date: 08/12/2014 PT Individual Time: 0900-1000 PT Individual Time Calculation (min): 60 min   Short Term Goals: Week 1:  PT Short Term Goal 1 (Week 1): Pt will perform bed mobility to L and R with mod A PT Short Term Goal 2 (Week 1): Pt will perform stand pivot transfers to L and R with consistent mod A PT Short Term Goal 3 (Week 1): Pt will perform ambulation 25' with mod A, LRAD and begin trials of use of service dog PT Short Term Goal 4 (Week 1): Pt will negotiate up/down ramp with mod A and LRAD PT Short Term Goal 5 (Week 1): Pt will tolerate 5 min functional activities in standing for dynamic standing balance training  Skilled Therapeutic Interventions/Progress Updates:   Pt received in bed with husband and service dog present.  Pt husband reporting that pt c/o dizziness with bed mobility.  Pt placed in supine and performed rolling to R side from flat bed with mod A with pt reporting "spinning" that resolved within 20 seconds; unable to observe nystagmus due to prosthetic eyes.  Also performed supine >sit with mod A and reports of "spinning" which resolved within 20 seconds.  Pt may benefit from vestibular eval if cleared by MD.  Pt performed sit > stand and transfers bed > w/c > mat stand pivot with mod lifting and hand held assistance.  On mat pt placed in supine with mod A; provided PROM for low back and hip stretching for management of LBP.  Performed gait training today with RW x 15' with min-mod A for facilitation of upright trunk, weigh shifting, cues for safety with RW and for increased stance time and step length bilaterally.  Pt continued to report pain in low back and requesting water.  Pt returned to room in w/c and obtained nectar thick juice and water for pt.  Also discussed Kpad for back pain with PA.     Therapy Documentation Precautions:   Precautions Precautions: Fall Precaution Comments: Pt blind in both eyes; bilat prosthetic eyes  Restrictions Weight Bearing Restrictions: No Vital Signs: Therapy Vitals Temp: 98.8 F (37.1 C) Temp Source: Oral Pulse Rate: (!) 105 Resp: 18 BP: (!) 148/75 mmHg Patient Position (if appropriate): Lying Oxygen Therapy SpO2: 94 % O2 Device: Not Delivered Pain:  Pt reporting pain in L ear and pain in low back R>L; RN notified and discussed with PA use of Kpad; PA to order for low back.   Locomotion : Ambulation Ambulation/Gait Assistance: 3: Mod assist Wheelchair Mobility Distance: 150   See FIM for current functional status  Therapy/Group: Individual Therapy  Edman CircleHall, Brenn Deziel [facility] CenterFaucette 08/12/2014, 3:43 PM

## 2014-08-12 NOTE — Progress Notes (Signed)
Occupational Therapy Session Note  Patient Details  Name: Jaime [name] MRN: [id number] Date of Birth: [birth year]  Today's Date: 08/12/2014 OT Individual Time: 1400-1445 OT Individual Time Calculation (min): 45 min    Skilled Therapeutic Interventions/Progress Updates:    Pt performed simulated shower transfer during session, using RW and wooden board.  [name] present for session and reports that they have a large walk-in shower with grab bars and a seat that can be placed inside as needed.  She needed mod physical assist an max demonstrational cueing to complete the transfer in and out with the walker.  Attempted hand held assist for transfer but pt too unsteady at this time and needs greater UE support.  When assisting with sit to stand pt reporting increased pain to the touch in her lower right back.  Positioned back in chair with pt requesting to go back to the room and lay down secondary to moderate fatigue.  Therapist compromised with second part of session by having pt perform UE exercises while seated in the wheelchair.  She utilized 2 lb dowel rod for exercises.  Min facilitation to perform 2 sets of shoulder flexion 10 repetitions and supervision to perform 20 repetitions of elbow flexion.  Pt again requesting to lay down so finally returned to room and had pt ambulate to the bed with min facilitation.  Mod assist for transfer from sit to supine with lifting LEs in the bed.   Pt missed 15 mins of OT session.  Therapy Documentation Precautions:  Precautions Precautions: Fall Precaution Comments: Pt blind in both eyes; bilat prosthetic eyes  Restrictions Weight Bearing Restrictions: No  Pain: Pain Assessment Pain Assessment: Faces Faces Pain Scale: Hurts a little bit Pain Type: Acute pain Pain Location: Back Pain Orientation: Lower;Right Pain Descriptors / Indicators: Crushing Pain Intervention(s): Repositioned;Heat applied Multiple Pain Sites: No ADL: See FIM for  current functional status  Therapy/Group: Individual Therapy  Onetha Gaffey OTR/L 08/12/2014, 3:53 PM
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
## 2014-08-12 NOTE — Progress Notes (Signed)
Physical Therapy Session Note  Patient Details  Name: Jaime [name] MRN: [id number] Date of Birth: 03/22/1928  Today's Date: 08/12/2014 PT Individual Time: 1300-1400 PT Individual Time Calculation (min): 60 min   Short Term Goals: Week 1:  PT Short Term Goal 1 (Week 1): Pt will perform bed mobility to L and R with mod A PT Short Term Goal 2 (Week 1): Pt will perform stand pivot transfers to L and R with consistent mod A PT Short Term Goal 3 (Week 1): Pt will perform ambulation 25' with mod A, LRAD and begin trials of use of service dog PT Short Term Goal 4 (Week 1): Pt will negotiate up/down ramp with mod A and LRAD PT Short Term Goal 5 (Week 1): Pt will tolerate 5 min functional activities in standing for dynamic standing balance training  Skilled Therapeutic Interventions/Progress Updates:   Pt received lying in bed, family speaking with CSW upon PT arrival.  Discussed CSW role in rehab stay.  Pt agreeable to therapy session and performed bed mobility at mod A level with HOB flat and without use of rails to better simulate home environment. Requires tactile and verbal cues for directing due to vision deficits.  Once at EOB, pt reluctant to stand due to anxiety and pain, therefore performed squat pivot into w/c with mod A and verbal and manual facilitation for increased forward weight shift.  Assisted pt to/from therapy gym for time management and energy conservation.  Once in hallway, performed 45' gait with use of RW at min/mod A level (+2 for chair follow) in controlled and home simulated environment. While in ADL apt, discussed with pt and husband getting into her bed at home and the fact that it is very high.   Discussed getting small step for her to step up onto to increase safety.  Pts husband verbalized understanding.  Pt ambulated another 6340' as above prior to performing ambulation up/down curb and ramp to simulate home and environment.  Again husband states that their ramp is very  steep, but she was able to ambulate up/down before.  Ambulated up/down ramp at min/mod A level with RW and continued cues for guidance.  Performed curb with RW at mod to max A level and cues for safety and sequencing with seated rest break.  Pt needed many seated rest breaks during session due to fatigue and increased work of breathing.  HR up to 118 at the highest, otherwise was 108-110 with SaO2 in the 90's throughout session on RA.   Pt assisted back to room and left in w/c for next therapy session.    Therapy Documentation Precautions:  Precautions Precautions: Fall Precaution Comments: Pt blind in both eyes; bilat prosthetic eyes  Restrictions Weight Bearing Restrictions: No   Vital Signs: Therapy Vitals Temp: 98.8 F (37.1 C) Temp Source: Oral Pulse Rate: (!) 105 Resp: 18 BP: (!) 148/75 mmHg Patient Position (if appropriate): Lying Oxygen Therapy SpO2: 94 % O2 Device: Not Delivered Pain: Pain Assessment Pain Assessment: Faces Faces Pain Scale: Hurts a little bit Pain Type: Acute pain Pain Location: Back Pain Orientation: Lower;Right Pain Descriptors / Indicators: Crushing Pain Intervention(s): Repositioned;Heat applied Multiple Pain Sites: No   Locomotion : Ambulation Ambulation/Gait Assistance: 3: Mod assist Wheelchair Mobility Distance: 150   See FIM for current functional status  Therapy/Group: Individual Therapy  Vista Deckarcell, [name] 08/12/2014, 4:10 PM

## 2014-08-12 NOTE — Care Management Note (Addendum)
[facility] Individual Statement of Services  Patient Name:  Jaime [name]  Date:  08/12/2014  Welcome to the [facility].  Our goal is to provide you with an individualized program based on your diagnosis and situation, designed to meet your specific needs.  With this comprehensive rehabilitation program, you will be expected to participate in at least 3 hours of rehabilitation therapies Monday-Friday, with modified therapy programming on the weekends.  Your rehabilitation program will include the following services:  Physical Therapy (PT), Occupational Therapy (OT), Speech Therapy (ST), 24 hour per day rehabilitation nursing, Therapeutic Recreaction (TR), Neuropsychology, Case Management (Social Worker), Rehabilitation Medicine, Nutrition Services and Pharmacy Services  Weekly team conferences will be held on Tuesdays to discuss your progress.  Your Social Worker will talk with you frequently to get your input and to update you on team discussions.  Team conferences with you and your family in attendance may also be held.  Expected length of stay: 14-17 days  Overall anticipated outcome: supervision/ min assist  Depending on your progress and recovery, your program may change. Your Social Worker will coordinate services and will keep you informed of any changes. Your Social Worker's name and contact numbers are listed  below.  The following services may also be recommended but are not provided by the Inpatient [facility]:    Home Health Rehabiltiation Services  Outpatient Rehabilitation Services    Arrangements will be made to provide these services after discharge if needed.  Arrangements include referral to agencies that provide these services.  Your insurance has been verified to be:  [employer] Your primary doctor is:  Bethann PunchesMark Miller  Pertinent information will be shared with your doctor and your insurance company.  Social  Worker:  [place] Lysander [place], [place] [phone] or ([phone]) [phone]   Information discussed with and copy given to patient by: Amada JupiterHOYLE, [name], 08/12/2014, 1:01 PM

## 2014-08-12 NOTE — Progress Notes (Signed)
Speech Language Pathology Daily Session Note  Patient Details  Name: Jaime [name] MRN: [id number] Date of Birth: 1927/12/04  Today's Date: 08/12/2014 SLP Individual Time: 1000-1100 SLP Individual Time Calculation (min): 60 min  Short Term Goals: Week 1: SLP Short Term Goal 1 (Week 1): Patient will consume least restrictive diet without any s/s of aspiration with min A given visual/tactile cues. SLP Short Term Goal 1 - Progress (Week 1): Progressing toward goal SLP Short Term Goal 2 (Week 1): Pt will identify 2 physical deficits and 2 new cognitive deficits with mod A given question cues. SLP Short Term Goal 2 - Progress (Week 1): Progressing toward goal SLP Short Term Goal 3 (Week 1): Pt will be oriented place, time, situation with min A using external aids given verbal/question cues. SLP Short Term Goal 3 - Progress (Week 1): Progressing toward goal SLP Short Term Goal 4 (Week 1): Pt will follow 3 step commands within functional tasks with mod A given verbal cues. SLP Short Term Goal 4 - Progress (Week 1): Progressing toward goal SLP Short Term Goal 5 (Week 1): Pt will recall biographical/new information with functional tasks with moda A given verbal/question cues. SLP Short Term Goal 5 - Progress (Week 1): Progressing toward goal  Skilled Therapeutic Interventions: Pt was seen for skilled ST intervention with focus on communication and cognitive goals. 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  Pt was observed with thin liquid (water), and did not exhibit overt s/s aspiration. However, intermittently throughout the session, pt was noted to cough. Recommend continuing with nectar thick liquids. Discussed goals of each therapy, and explained rationale for tasks. Husband present during session, and indicated pt  having difficulty with reading comprehension. He brought in his version of "Daily Bread", which is the same edition as pt's Braille version. Recommend addressing reading comprehension in subsequent sessions, as pt was an avid reader prior to admit.    FIM:  Comprehension Comprehension Mode: Auditory Comprehension: 4-Understands basic 75 - 89% of the time/requires cueing 10 - 24% of the time Expression Expression Mode: Verbal Expression: 5-Expresses basic 90% of the time/requires cueing < 10% of the time. Social Interaction Social Interaction: 6-Interacts appropriately with others with medication or extra time (anti-anxiety, antidepressant). Problem Solving Problem Solving: 3-Solves basic 50 - 74% of the time/requires cueing 25 - 49% of the time Memory Memory: 3-Recognizes or recalls 50 - 74% of the time/requires cueing 25 - 49% of the time FIM - Eating Eating Activity: 5: Supervision/cues  Pain Pain Assessment Pain Assessment: 0-10 Pain Score: 6  Pain Type: Acute pain Pain Location: Back Pain Intervention(s): Repositioned;Emotional support;Other (Comment) (pt reported she had received pain meds recently)  Therapy/Group: Individual Therapy   Jaime Eid B. [place], [facility] CityMSP, CCC-SLP [phone]  Jaime [name], Jaime [name] 08/12/2014, 11:52 AM
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
## 2014-08-12 NOTE — Progress Notes (Signed)
Patient information reviewed and entered into eRehab system by Bruno Leach, RN, CRRN, PPS Coordinator.  Information including medical coding and functional independence measure will be reviewed and updated through discharge.     Per nursing patient was given "Data Collection Information Summary for Patients in Inpatient Rehabilitation Facilities with attached "Privacy Act Statement-Health Care Records" upon admission.  

## 2014-08-13 ENCOUNTER — Inpatient Hospital Stay (HOSPITAL_COMMUNITY): Payer: Medicare PPO | Admitting: Physical Therapy

## 2014-08-13 ENCOUNTER — Inpatient Hospital Stay (HOSPITAL_COMMUNITY): Payer: Medicare PPO | Admitting: Speech Pathology

## 2014-08-13 ENCOUNTER — Inpatient Hospital Stay (HOSPITAL_COMMUNITY): Payer: BC Managed Care – PPO | Admitting: Speech Pathology

## 2014-08-13 ENCOUNTER — Inpatient Hospital Stay (HOSPITAL_COMMUNITY): Payer: BC Managed Care – PPO

## 2014-08-13 DIAGNOSIS — S065X3D Traumatic subdural hemorrhage with loss of consciousness of 1 hour to 5 hours 59 minutes, subsequent encounter: Secondary | ICD-10-CM

## 2014-08-13 MED ORDER — GUAIFENESIN ER 600 MG PO TB12
600.0000 mg | ORAL_TABLET | Freq: Two times a day (BID) | ORAL | Status: DC
  Administered 2014-08-13 – 2014-08-23 (×21): 600 mg via ORAL
  Filled 2014-08-13 (×23): qty 1

## 2014-08-13 MED ORDER — ACETAMINOPHEN 500 MG PO TABS
500.0000 mg | ORAL_TABLET | Freq: Three times a day (TID) | ORAL | Status: DC
  Administered 2014-08-13 – 2014-08-23 (×29): 500 mg via ORAL
  Filled 2014-08-13 (×36): qty 1
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
## 2014-08-13 NOTE — Progress Notes (Signed)
Speech Language Pathology Daily Session Note  Patient Details  Name: Jaime [name] MRN: [id number] Date of Birth: [birth year]  Today's Date: 08/13/2014 SLP Individual Time: 1130-1200 SLP Individual Time Calculation (min): 30 min  Short Term Goals: Week 1: SLP Short Term Goal 1 (Week 1): Patient will consume least restrictive diet without any s/s of aspiration with min A given visual/tactile cues. SLP Short Term Goal 1 - Progress (Week 1): Progressing toward goal SLP Short Term Goal 2 (Week 1): Pt will identify 2 physical deficits and 2 new cognitive deficits with mod A given question cues. SLP Short Term Goal 2 - Progress (Week 1): Progressing toward goal SLP Short Term Goal 3 (Week 1): Pt will be oriented place, time, situation with min A using external aids given verbal/question cues. SLP Short Term Goal 3 - Progress (Week 1): Progressing toward goal SLP Short Term Goal 4 (Week 1): Pt will follow 3 step commands within functional tasks with mod A given verbal cues. SLP Short Term Goal 4 - Progress (Week 1): Progressing toward goal SLP Short Term Goal 5 (Week 1): Pt will recall biographical/new information with functional tasks with moda A given verbal/question cues. SLP Short Term Goal 5 - Progress (Week 1): Progressing toward goal  Skilled Therapeutic Interventions: Skilled treatment session focused on communication goals.  SLP facilitated session by providing min A cues during reading comprehension task. Pt was reading an article in Braille while SLP read corresponding article. Pt misread words and required cues to realize the sentence didn't make sense. Pt verbalized realization that decreased sensation in her right hand was causing the difficulty with her comprehension. Pt uses both left and right hands to read Braille, but is unable to use left hand to "clarify" what the right hand senses. Pt was encouraged to continue reading to improve either sensation or her ability to compensate  for decreased sensation.   FIM:  Comprehension Comprehension Mode: Auditory Comprehension: 5-Follows basic conversation/direction: With no assist Expression Expression Mode: Verbal Expression: 5-Expresses basic needs/ideas: With extra time/assistive device Social Interaction Social Interaction: 6-Interacts appropriately with others with medication or extra time (anti-anxiety, antidepressant). Problem Solving Problem Solving: 4-Solves basic 75 - 89% of the time/requires cueing 10 - 24% of the time Memory Memory: 4-Recognizes or recalls 75 - 89% of the time/requires cueing 10 - 24% of the time FIM - Eating Eating Activity: 5: Supervision/cues;5: Set-up assist for open containers  Pain Pain Assessment Pain Assessment: 0-10 Pain Score: 6  Faces Pain Scale: Hurts worst Pain Type: Acute pain Pain Location: Back Pain Orientation: Right;Lower Pain Descriptors / Indicators: Spasm;Sharp Pain Onset: With Activity Pain Intervention(s): RN made aware;Repositioned;Emotional support  Therapy/Group: Individual Therapy   Maryalice Pasley B. [place], [facility] HospitalMSP, CCC-SLP [phone]  Leigh AuroraBueche, Tula Schryver Brown 08/13/2014, 4:26 PM

## 2014-08-13 NOTE — Progress Notes (Signed)
Speech Language Pathology Daily Session Notes  Patient Details  Name: Jaime [name] MRN: [id number] Date of Birth: 05-07-1927  Today's Date: 08/13/2014  Session 1: SLP Individual Time: 0800-0900 SLP Individual Time Calculation (min): 60 min   Session 2: SLP Individual Time: 1330-1400 SLP Individual Time Calculation (min): 30 min  Short Term Goals: Week 1: SLP Short Term Goal 1 (Week 1): Patient will consume least restrictive diet without any s/s of aspiration with min A given visual/tactile cues. SLP Short Term Goal 1 - Progress (Week 1): Progressing toward goal SLP Short Term Goal 2 (Week 1): Pt will identify 2 physical deficits and 2 new cognitive deficits with mod A given question cues. SLP Short Term Goal 2 - Progress (Week 1): Progressing toward goal SLP Short Term Goal 3 (Week 1): Pt will be oriented place, time, situation with min A using external aids given verbal/question cues. SLP Short Term Goal 3 - Progress (Week 1): Progressing toward goal SLP Short Term Goal 4 (Week 1): Pt will follow 3 step commands within functional tasks with mod A given verbal cues. SLP Short Term Goal 4 - Progress (Week 1): Progressing toward goal SLP Short Term Goal 5 (Week 1): Pt will recall biographical/new information with functional tasks with moda A given verbal/question cues. SLP Short Term Goal 5 - Progress (Week 1): Progressing toward goal  Skilled Therapeutic Interventions:  Session 1: Skilled treatment session focused on dysphagia and cognitive goals. Upon arrival, patient was supine in bed and agreeable to participate in treatment session.  Patient required extra time to sit upright for appropriate positioning for PO intake due to back pain and dizziness. RN made aware. Patient consumed breakfast meal of Dys. 3 textures with nectar-thick liquids with intermittent cough, however, patient with coughing prior to PO intake so difficult to differentiate. Patient required Min verbal cues for  small bites and to self-monitor and correct oral residue. Patient independently reported she needed to use the commode and performed toileting and self-care tasks of washing her hands and brushing her teeth with Min A verbal cues for safety, function was also impacted by pain. Patient left in wheelchair with husband present. Continue with current plan of care.    Session 2: Skilled treatment session focused on cognitive goals. Upon arrival, patient supine in bed and reporting back pain. RN made aware and administered medications. Patient required Mod A question cues to recall her current medications and their functions. Patient and husband also participated in a functional conversation with this clinician in regards to safety with medication management and ways to increase recall and carryover with new medications at discharge such as utilizing a pill box. Both verbalized understanding but will need reinforcement. Patient left in bed with family present. Continue with current plan of care.   FIM:  Comprehension Comprehension Mode: Auditory Comprehension: 5-Follows basic conversation/direction: With no assist Expression Expression Mode: Verbal Expression: 5-Expresses basic needs/ideas: With extra time/assistive device Social Interaction Social Interaction: 6-Interacts appropriately with others with medication or extra time (anti-anxiety, antidepressant). Problem Solving Problem Solving: 4-Solves basic 75 - 89% of the time/requires cueing 10 - 24% of the time Memory Memory: 4-Recognizes or recalls 75 - 89% of the time/requires cueing 10 - 24% of the time FIM - Eating Eating Activity: 5: Supervision/cues;5: Set-up assist for open containers  Pain Pain Assessment Pain Assessment: No/denies pain Faces Pain Scale: Hurts worst Pain Type: Acute pain Pain Location: Back Pain Orientation: Right;Lower Pain Descriptors / Indicators: Spasm;Sharp Pain Onset: With Activity Pain  Intervention(s):  Repositioned;Heat applied;Therapeutic touch  Therapy/Group: Individual Therapy  Sayuri Rhames 08/13/2014, 4:10 PM

## 2014-08-13 NOTE — Progress Notes (Signed)
Physical Therapy Session Note  Patient Details  Name: Jaime [name] MRN: [id number] Date of Birth: 07-18-1927  Today's Date: 08/13/2014 PT Individual Time: 1005-1109 PT Individual Time Calculation (min): 64 min   Short Term Goals: Week 1:  PT Short Term Goal 1 (Week 1): Pt will perform bed mobility to L and R with mod A PT Short Term Goal 2 (Week 1): Pt will perform stand pivot transfers to L and R with consistent mod A PT Short Term Goal 3 (Week 1): Pt will perform ambulation 25' with mod A, LRAD and begin trials of use of service dog PT Short Term Goal 4 (Week 1): Pt will negotiate up/down ramp with mod A and LRAD PT Short Term Goal 5 (Week 1): Pt will tolerate 5 min functional activities in standing for dynamic standing balance training  Skilled Therapeutic Interventions/Progress Updates:   Pt seated in w/c and reporting pain in bilat ears today; pt also reporting increased pain in low back today.  Pt reporting pain all night; she also reports pain relieved with heating pad and lying supine.  Pt very tender over quadratus lumborum.  Pt premedicated but pt reassured pain would be addressed during conference.  Pt agreeable to try bed mobility on ADL bed.  Pt transported to ADL apartment in w/c and discussed bedroom set up with husband as well as options for toileting at night and how to awaken husband at night.  Husband reports they do have a bell she could use to awaken him for assistance.  He is also interested in adding a rail to their bed.  Discussed with pt and husband use of BSC at night for frequence and urgency; will continue to discuss with pt and team.  Pt performed transfer from w/c with mod A secondary to pain and required mod A for ambulation 5' w/c > bed secondary to pain in low back with WB through each LE; also required verbal cues for navigation around furniture obstacles.  Pt required mod-max A for sit<> supine on flat bed, no rails due to pain in low back.  Once on bed  performed gentle PROM and active pelvic mobilizations into anterior/posterior rotation while in R and L sidelying for pain management; pt required min A to roll to R side, mod-max A to L side due to pain.  Following bed mobility training returned to w/c with mod-max A lateral stepping to R.  Returned to room and pt placed in recliner with mod-max A stand pivot with RW and left in recliner with hot pad behind back and all items with in reach; husband present to supervise.     Therapy Documentation Precautions:  Precautions Precautions: Fall Precaution Comments: Pt blind in both eyes; bilat prosthetic eyes  Restrictions Weight Bearing Restrictions: No Vital Signs: Oxygen Therapy O2 Device: Not Delivered Pain: Pain Assessment Pain Assessment: Faces Pain Score: 8  Faces Pain Scale: Hurts worst Pain Type: Acute pain Pain Location: Back Pain Orientation: Right;Lower Pain Descriptors / Indicators: Spasm;Sharp Pain Onset: With Activity Pain Intervention(s): Repositioned;Heat applied;Therapeutic touch Locomotion : Ambulation Ambulation/Gait Assistance: 3: Mod assist   See FIM for current functional status  Therapy/Group: Individual Therapy  Edman CircleHall, [name] [facility] 08/13/2014, 12:49 PM
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
## 2014-08-13 NOTE — Plan of Care (Signed)
Problem: RH PAIN MANAGEMENT Goal: RH STG PAIN MANAGED AT OR BELOW PT'S PAIN GOAL Rate pain <3/10  Outcome: Not Progressing Rates pain as 5
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
## 2014-08-14 ENCOUNTER — Inpatient Hospital Stay (HOSPITAL_COMMUNITY): Payer: BC Managed Care – PPO

## 2014-08-14 ENCOUNTER — Inpatient Hospital Stay (HOSPITAL_COMMUNITY): Payer: BC Managed Care – PPO | Admitting: Physical Therapy

## 2014-08-14 ENCOUNTER — Inpatient Hospital Stay (HOSPITAL_COMMUNITY): Payer: Medicare PPO

## 2014-08-14 ENCOUNTER — Inpatient Hospital Stay (HOSPITAL_COMMUNITY): Payer: BC Managed Care – PPO | Admitting: Speech Pathology

## 2014-08-14 NOTE — Progress Notes (Signed)
Physical Therapy Session Note  Patient Details  Name: Shaune Spittleatricia S Victor MRN: [id number] Date of Birth: July 03, 1927  Today's Date: 08/14/2014 PT Individual Time: 0830-0900 PT Individual Time Calculation (min): 30 min  and Today's Date: 08/14/2014 PT Missed Time: 30 Minutes Missed Time Reason:  (Pt eating breakfast)  Short Term Goals: Week 1:  PT Short Term Goal 1 (Week 1): Pt will perform bed mobility to L and R with mod A PT Short Term Goal 2 (Week 1): Pt will perform stand pivot transfers to L and R with consistent mod A PT Short Term Goal 3 (Week 1): Pt will perform ambulation 25' with mod A, LRAD and begin trials of use of service dog PT Short Term Goal 4 (Week 1): Pt will negotiate up/down ramp with mod A and LRAD PT Short Term Goal 5 (Week 1): Pt will tolerate 5 min functional activities in standing for dynamic standing balance training  Skilled Therapeutic Interventions/Progress Updates:    At scheduled therapy time pt completing toileting with NT and NS and reported she had not eaten breakfast yet. Pt asked for time to eat breakfast before participating in therapy. Therapist came back 30 minutes later as pt was completing breakfast. Remainder of session focused on problem solving, sequencing, functional transfers. Instructed pt in hand and oral hygiene at sink w/ pt requiring mod cueing for sequencing. Pt reported need for bathroom again. Therapist provided ModA for stand pivot transfer w/c<>commode w/ use of grab bars. Pt left seated in w/c w/ husband present and all needs within reach.  Therapy Documentation Precautions:  Precautions Precautions: Fall Precaution Comments: Pt blind in both eyes; bilat prosthetic eyes  Restrictions Weight Bearing Restrictions: No Pain:  Pt continued to complain of back pain with activity, though said it was improved since yesterday.  See FIM for current functional status  Therapy/Group: Individual Therapy  Hosie SpangleGodfrey, Ronie Fleeger  Hosie SpangleJess Patrik Turnbaugh, PT,  DPT 08/14/2014, 7:45 AM
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
## 2014-08-14 NOTE — Patient Care Conference (Signed)
Inpatient RehabilitationTeam Conference and Plan of Care Update Date: 08/13/2014   Time: 3:10 PM    Patient Name: Jaime [name]      Medical Record Number: [id number]  Date of Birth: [birth year] Sex: Female         Room/Bed: 4W17C/4W17C-01 Payor Info: Payor: [employer] MEDICARE / Plan: [employer] MEDICARE CHOICE PPO / Product Type: *No Product type* /    Admitting Diagnosis: TRAUMATIC L SDH  Admit Date/Time:  08/09/2014  7:49 PM Admission Comments: No comment available   Primary Diagnosis:  <principal problem not specified> Principal Problem: <principal problem not specified>  Patient Active Problem List   Diagnosis Date Noted  . SDH (subdural hematoma) 08/09/2014  . Agitation   . Encephalopathy acute   . Encounter for orogastric (OG) tube placement   . Blind   . HOH (hard of hearing)   . Acute respiratory failure   . Acute respiratory failure with hypoxemia   . Traumatic brain injury 07/30/2014  . Subdural hemorrhage following injury 07/30/2014  . Subdural hematoma 07/30/2014  . Postop Hypoxia 10/22/2011  . Postoperative anemia 10/20/2011  . Postop Transfusion 10/20/2011  . Postop Hyponatremia 10/20/2011  . OA (osteoarthritis) of knee 10/18/2011    Expected Discharge Date: Expected Discharge Date: 08/23/14  Team Members Present: Physician leading conference: Dr. Faith RogueZachary Swartz Social Worker Present: Amada JupiterLucy Myrlene Riera, LCSW Nurse Present: Carmie EndAngie Joyce, RN PT Present: Bayard Huggerebecca Varner, PT OT Present: Ardis Rowanom Lanier, Darolyn RuaOTA;Kayla Perkinson, OT SLP Present: Feliberto Gottronourtney Payne, SLP PPS Coordinator present : Tora DuckMarie Noel, RN, CRRN     Current Status/Progress Goal Weekly Team Focus  Medical   SDH after fall, crani.   improve activity tolerance, balance  po intake, balance   Bowel/Bladder   Incontinent of bowel x 1. LBM 08/12/14. Continent of bladder  Continent of bowel and bladder  Monitor   Swallow/Nutrition/ Hydration   Dys. 3 textures with nectar-thick liquids, Mod A for use of swallowing  compensatory strategies.   Supervision  increased utilization of swallowing compensatory strategies, trials of thin liquids    ADL's   total A LB dressing, mod A bathing, min A UB self-care, min-mod A functional transfers  supervision overall with min A bathing and tub transfers  functional transfers/mobility, strengthening, standing balance, activity tolerance, education    Mobility   Mod A overall  Supervision overall with LRAD and service dog  Activity tolerance/endurance, gait, standing balance   Communication   Min A  Supervision  Reading comprehension    Safety/Cognition/ Behavioral Observations  Min-Mod A  Supervision-Min A  awareness, problem solving and recall    Pain   Tylenol 650mg  q 4hrs for L ear discomfort  <4  Monitor for effectivenss   Skin   L crain incision with staples, approximated, OTA, unremarkable. BUE Ecchymosis, Skin tear to R wrist with Tegaderm intact. Abrasion to L abdominal fold-tegaderm to area   No additional skin breakdown  Assess q shift for appropriate healing    Rehab Goals Patient on target to meet rehab goals: Yes *See Care Plan and progress notes for long and short-term goals.  Barriers to Discharge: vision, new cognitive deficits    Possible Resolutions to Barriers:  NMR, cognitive behavioral rx    Discharge Planning/Teaching Needs:  home with husband who can provide 24/7 assistance      Team Discussion:  Medically doing well;  MBS tomorrow.  Pain in lower back on right side which did interfere with txs. Also with ear pain and dizziness.  Having vestibular  issues - will complete vest eval.  Husband somewhat resistant to new strategy suggestions.    Revisions to Treatment Plan:  None   Continued Need for Acute Rehabilitation Level of Care: The patient requires daily medical management by a physician with specialized training in physical medicine and rehabilitation for the following conditions: Daily direction of a multidisciplinary physical  rehabilitation program to ensure safe treatment while eliciting the highest outcome that is of practical value to the patient.: Yes Daily medical management of patient stability for increased activity during participation in an intensive rehabilitation regime.: Yes Daily analysis of laboratory values and/or radiology reports with any subsequent need for medication adjustment of medical intervention for : Post surgical problems;Neurological problems  Kylen Schliep 08/14/2014, 12:48 PM

## 2014-08-14 NOTE — Progress Notes (Signed)
Social Work Patient ID: Jaime [name], female   DOB: 1927-05-17, 81 y.o.   MRN: [id number]   Met with pt and husband today to review team conference.  Both aware and agreeable with targeted d/c date of 5/13 with supervision goals overall.  No concerns at present.  Husband pleased with progress he is seeing today.  Continue to follow for support and d/c planning needs.  Jaime Spaeth, LCSW

## 2014-08-15 ENCOUNTER — Inpatient Hospital Stay (HOSPITAL_COMMUNITY): Payer: BC Managed Care – PPO | Admitting: Physical Therapy

## 2014-08-15 ENCOUNTER — Inpatient Hospital Stay (HOSPITAL_COMMUNITY): Payer: Medicare PPO

## 2014-08-15 ENCOUNTER — Inpatient Hospital Stay (HOSPITAL_COMMUNITY): Payer: BC Managed Care – PPO

## 2014-08-15 DIAGNOSIS — S065X3S Traumatic subdural hemorrhage with loss of consciousness of 1 hour to 5 hours 59 minutes, sequela: Secondary | ICD-10-CM
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
## 2014-08-15 NOTE — Progress Notes (Signed)
Occupational Therapy Session Note  Patient Details  Name: Jaime Spittleatricia S Prisk MRN: [id number] Date of Birth: 12-14-1927  Today's Date: 08/15/2014 OT Individual Time: 1105-1205 and 1400-1500 OT Individual Time Calculation (min): 60 min and 60 min     Short Term Goals: Week 1:  OT Short Term Goal 1 (Week 1): Pt. will bathe with min assist OT Short Term Goal 2 (Week 1): pt. will dress with min assist OT Short Term Goal 3 (Week 1): Pt. will transfer to toilet with min assist OT Short Term Goal 4 (Week 1): pt will stand during grooming with min assit  Skilled Therapeutic Interventions/Progress Updates:    Session 1: Pt seen for ADL retraining with focus on functional transfers, standing balance, activity tolerance, sequencing, and safety awareness. Pt received supine in bed. Completed supine>sit with min A and mod cues. Pt required long rest break d/t dizziness. Completed stand pivot transfer bed>w/c at min A. Pt completed stand pivot transfers w/c<>shower chair and w/c<>toilet with min A using grab bars. Pt required mod cues for sequencing during bathing and mod A to complete. Pt completed dressing sit<>stand from w/c with min A sit<>stand and dynamic standing balance. Pt with LOB posteriorly requiring max A to correct. Pt completed toileting with min A standing balance and mod A to manage pants up. Pt required multiple rest breaks throughout session d/t fatigue. Pt left sitting in w/c with husband present and all needs in reach.  Session 2: Pt seen for 1:1 OT session with focus on sit<>stand, postural control, functional mobility, and activity tolerance. Pt received sitting in w/c requesting to work on "walking." Encouraged initiation of therapeutic activity. Engaged in functional mobility in hallway with use of RW at min A. Pt ambulated short distances, approx 15-20' 4x before fatiguing, simulating ambulation bed>bathroom at home. Pt required max cues for safety with RW. Pt's HR 100-107 following  functional mobility requiring increased time for HR to return to normal range. SpO2>95% throughout. Returned to room and completed sit<>stand 4x with use of [place] to challenge balance. Pt required min-mod A for postural control in standing as pt demonstrating posterior lean. Pt perseverative on RN "washing hair" throughout session requiring mod cues to redirect. Pt left sitting in w/c with all needs in reach and family present.  Of note, pt's husband inconsistent with report of assistance required at home PTA. Husband reporting pt was independent with self-care and functional mobility then later reports he was providing hands on assistance for both. Will continue to follow-up.   Therapy Documentation Precautions:  Precautions Precautions: Fall Precaution Comments: Pt blind in both eyes; bilat prosthetic eyes  Restrictions Weight Bearing Restrictions: No General:   Vital Signs:  Pain: Pt reports low back pain however able to participate   See FIM for current functional status  Therapy/Group: Individual Therapy  Daneil Danerkinson, Lucille Witts N 08/15/2014, 12:14 PM

## 2014-08-15 NOTE — Progress Notes (Signed)
Occupational Therapy Note  Patient Details  Name: Jaime [name] MRN: [id number] Date of Birth: April 18, 1927  Today's Date: 08/15/2014 OT Individual Time: 1330-1400 OT Individual Time Calculation (min): 30 min   Pt denied pain Individual Therapy  Pt initially engaged in toilet transfers and toileting tasks.  Pt required steady A for SPT using grab bars.  Pt completed toileting tasks with mod A.  Pt transitioned to dynamic standing tasks with focus on activity tolerance, standing balance, and safety awareness to increase independence with BADLs.   Lavone NeriLanier, Mitzie Marlar [facility] 08/15/2014, 3:03 PM
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
## 2014-08-15 NOTE — Progress Notes (Signed)
Speech Language Pathology Weekly Progress and Session Note  Patient Details  Name: Jaime [name] MRN: [id number] Date of Birth: 02-Dec-1927  Beginning of progress report period: August 08, 2014 End of progress report period: Aug 16, 2014  Today's Date: 08/16/2014 SLP Individual Time: 0800-0904 SLP Individual Time Calculation (min): 64 min  Short Term Goals: Week 1: SLP Short Term Goal 1 (Week 1): Patient will consume least restrictive diet without any s/s of aspiration with min A given visual/tactile cues. SLP Short Term Goal 1 - Progress (Week 1): Met SLP Short Term Goal 2 (Week 1): Pt will identify 2 physical deficits and 2 new cognitive deficits with mod A given question cues. SLP Short Term Goal 2 - Progress (Week 1): Met SLP Short Term Goal 3 (Week 1): Pt will be oriented place, time, situation with min A using external aids given verbal/question cues. SLP Short Term Goal 3 - Progress (Week 1): Met SLP Short Term Goal 4 (Week 1): Pt will follow 3 step commands within functional tasks with mod A given verbal cues. SLP Short Term Goal 4 - Progress (Week 1): Met SLP Short Term Goal 5 (Week 1): Pt will recall biographical/new information with functional tasks with moda A given verbal/question cues. SLP Short Term Goal 5 - Progress (Week 1): Met    New Short Term Goals: Week 2: SLP Short Term Goal 1 (Week 2): Patient will consume least restrictive diet without any s/s of aspiration with supervision. SLP Short Term Goal 2 (Week 2): Pt will identify 2 physical deficits and 2 new cognitive deficits with min A given question cues. SLP Short Term Goal 3 (Week 2): Pt will be oriented place, time, situation with supervision cues for use of external aids.  SLP Short Term Goal 4 (Week 2): Pt will follow 3 step commands within functional tasks with min A given verbal cues. SLP Short Term Goal 5 (Week 2): Pt will recall biographical/new information with functional tasks with min A given  verbal/question cues.  Weekly Progress Updates:  Pt made functional gains this reporting period and has met 5 out of 5 short term goals.  Pt currently requires min assist for cognitive tasks due to decreased recall of new information and decreased functional problem solving.  Pt is consuming her currently prescribed diet with min cues for use of swallowing precautions to minimize overt s/s of aspiration.  Pt presents with mild intermittent word finding deficits in conversations for which she benefits from extra time and supervision cues to compensate.  Pt and family education is ongoing.  Pt would continue to benefit from skilled ST to maximize functional independence and reduce burden of care prior to discharge.     Intensity: Minumum of 1-2 x/day, 30 to 90 minutes Frequency: 3 to 5 out of 7 days Duration/Length of Stay: 20-24 days Treatment/Interventions: Cognitive remediation/compensation;Cueing hierarchy;Dysphagia/aspiration precaution training;Functional tasks;Speech/Language facilitation;Patient/family education;Internal/external aids;Multimodal communication approach   Daily Session  Skilled Therapeutic Interventions: Pt was seen for skilled ST targeting dysphagia and self care goals.  Upon arrival, pt was reclined in bed, awake, lethargic but agreeable to participate in [place].  Pt with complaints of headache.  RN made aware and meds were administered.  Pt followed multi-step commands to assist in transfer from bed to wheelchair with mod assist multimodal cues for redirection to task.  Pt consumed dys 3 textures and nectar thick liquids with min verbal cues to monitor and correct oral residue via liquid wash and rate/portion control.  Pt with strong,  congested cough before and throughout meal which did not appear to be related to PO intake.  Pt able to clear secretions orally independently.  Following completion of meal, pt initiated and sequenced oral care following set up with min-mod assist  verbal and tactile cues.  Pt is making functional progress towards goals.  Goals updated on this date to reflect current progress and plan of care.         FIM:  Comprehension Comprehension Mode: Auditory Comprehension: 4-Understands basic 75 - 89% of the time/requires cueing 10 - 24% of the time Expression Expression Mode: Verbal Expression: 5-Expresses complex 90% of the time/cues < 10% of the time Social Interaction Social Interaction: 6-Interacts appropriately with others with medication or extra time (anti-anxiety, antidepressant). Problem Solving Problem Solving: 4-Solves basic 75 - 89% of the time/requires cueing 10 - 24% of the time Memory Memory: 4-Recognizes or recalls 75 - 89% of the time/requires cueing 10 - 24% of the time FIM - Eating Eating Activity: 5: Supervision/cues;5: Set-up assist for open containers General    Pain Pain Assessment Pain Assessment: 0-10 Pain Score: 6  Pain Location: Head Pain Orientation: Left Pain Descriptors / Indicators: Aching;Headache Pain Intervention(s): RN made aware  Therapy/Group: Individual Therapy  Jaime [name], Selinda Orion 08/16/2014, 12:33 PM

## 2014-08-15 NOTE — Progress Notes (Signed)
Speech Language Pathology Daily Session Note  Patient Details  Name: Jaime [name] MRN: [id number] Date of Birth: 1928-01-14  Today's Date: 08/15/2014 SLP Individual Time: 1300-1330 SLP Individual Time Calculation (min): 30 min  Short Term Goals: Week 1: SLP Short Term Goal 1 (Week 1): Patient will consume least restrictive diet without any s/s of aspiration with min A given visual/tactile cues. SLP Short Term Goal 2 (Week 1): Pt will identify 2 physical deficits and 2 new cognitive deficits with mod A given question cues. SLP Short Term Goal 3 (Week 1): Pt will be oriented place, time, situation with min A using external aids given verbal/question cues. SLP Short Term Goal 4 (Week 1): Pt will follow 3 step commands within functional tasks with mod A given verbal cues. SLP Short Term Goal 5 (Week 1): Pt will recall biographical/new information with functional tasks with moda A given verbal/question cues.   Skilled Therapeutic Interventions:  Pt was seen for skilled ST targeting dysphagia goals.  Upon arrival, pt was seated upright in wheelchair, awake, lethargic, but agreeable to participate in ST.  SLP facilitated the session with hand over hand assist to facilitate pt's successful completion of thorough oral care with suction toothette (due to visual deficits) prior to regular water trials.  Pt consumed ice chips x6 with no changes in vocal quality and no coughing or throat clear.  Pt declined further trials of ice chips or water but requested that ice water be left so that she could "have some later."  SLP educated pt that trials of regular water and ice chips were to be done with SLP only due to continued risk of aspiration per yesterday's MBS.  Pt required skilled re-education regarding recommended swallowing precautions.  Following education, pt was able to recall 4 out of 4 precautions with min assist and spaced retrieval training.  Continue per current plan of care.    FIM:   Comprehension Comprehension Mode: Auditory Comprehension: 4-Understands basic 75 - 89% of the time/requires cueing 10 - 24% of the time Expression Expression Mode: Verbal Expression: 5-Expresses complex 90% of the time/cues < 10% of the time Social Interaction Social Interaction: 6-Interacts appropriately with others with medication or extra time (anti-anxiety, antidepressant). Problem Solving Problem Solving: 3-Solves basic 50 - 74% of the time/requires cueing 25 - 49% of the time Memory Memory: 3-Recognizes or recalls 50 - 74% of the time/requires cueing 25 - 49% of the time FIM - Eating Eating Activity: 5: Supervision/cues;5: Set-up assist for open containers  Pain Pain Assessment Pain Assessment: No/denies pain  Therapy/Group: Individual Therapy  Lequan Dobratz, Melanee SpryNicole L 08/15/2014, 4:01 PM

## 2014-08-16 ENCOUNTER — Inpatient Hospital Stay (HOSPITAL_COMMUNITY): Payer: BC Managed Care – PPO | Admitting: Occupational Therapy

## 2014-08-16 ENCOUNTER — Inpatient Hospital Stay (HOSPITAL_COMMUNITY): Payer: BC Managed Care – PPO | Admitting: Speech Pathology

## 2014-08-16 ENCOUNTER — Inpatient Hospital Stay (HOSPITAL_COMMUNITY): Payer: Medicare PPO | Admitting: Physical Therapy

## 2014-08-16 ENCOUNTER — Inpatient Hospital Stay (HOSPITAL_COMMUNITY): Payer: BC Managed Care – PPO

## 2014-08-16 LAB — CLOSTRIDIUM DIFFICILE BY PCR: Toxigenic C. Difficile by PCR: NEGATIVE

## 2014-08-16 MED ORDER — OXYCODONE-ACETAMINOPHEN 5-325 MG PO TABS
1.0000 | ORAL_TABLET | Freq: Four times a day (QID) | ORAL | Status: DC | PRN
  Administered 2014-08-16 – 2014-08-19 (×7): 1 via ORAL
  Filled 2014-08-16 (×11): qty 1

## 2014-08-16 NOTE — Progress Notes (Signed)
Occupational Therapy Session Note  Patient Details  Name: Jaime [name] MRN: [id number] Date of Birth: 06-23-1927  Today's Date: 08/16/2014 OT Individual Time: 1000-1100 OT Individual Time Calculation (min): 60 min    Skilled Therapeutic Interventions/Progress Updates:    1:1 self care training at shower level with focus on sit to stands, short distance functional ambulation, standing balance, sequencing, initiation, task organization, activity tolerance/ endurance and functional problem solving. Pt required min A/ steady A for all sit to stands from w/c, toilet and shower seat. Pt able to ambulate with HHA and grab bar from toilet to shower with min A with tactile and verbal cues due to blindness. Pt with difficulty threading underwear and pants requesting assistance due to fatigue and inability to get to her feet today. Pt with increased head pain (RN aware) which was distracting during tasks. LEft with husband and RN to work on Catering managerdetangling hair.  Therapy Documentation Precautions:  Precautions Precautions: Fall Precaution Comments: Pt blind in both eyes; bilat prosthetic eyes  Restrictions Weight Bearing Restrictions: No Pain: Pain Assessment Pain Assessment: 0-10 Pain Score: 6  Pain Location: Head Pain Orientation: Left Pain Descriptors / Indicators: Aching;Headache Pain Intervention(s): RN made aware  See FIM for current functional status  Therapy/Group: Individual Therapy  Roney MansSmith, Charissa [facility]les [facility] 08/16/2014, 1:34 PM

## 2014-08-16 NOTE — Progress Notes (Signed)
Physical Therapy Weekly Progress Note  Patient Details  Name: Jaime [name] MRN: [id number] Date of Birth: 1927-11-20  Beginning of progress report period: August 10, 2014 End of progress report period: Aug 16, 2014  Today's Date: 08/16/2014 PT Individual Time: 0907-1002 PT Individual Time Calculation (min): 55 min   Patient has made steady progress and has met  3 of 5 short term goals.  Pt is currently min-mod A for bed mobility and basic transfers, gait with RW and for ramp negotiation.  Pt has improved tolerance for gait and standing activities with decreased SOB, lightheadedness and elevated HR during functional mobility.  Pt low back pain has also improved increasing pt tolerance to therapy.  Pt participated in modified vestibular assessment with findings consistent with R horizontal canal BPPV canalithiasis which was treated on 08/15/14.  Pt now reporting minimal to no dizziness with bed mobility.      Patient continues to demonstrate the following deficits: Scalp/face pain, Decreased activity tolerance and endurance, decreased strength, impaired balance and gait and therefore will continue to benefit from skilled PT intervention to enhance overall performance with activity tolerance, balance, postural control, attention and gait.  Patient progressing toward long term goals..  Continue plan of care.  PT Short Term Goals Week 1:  PT Short Term Goal 1 (Week 1): Pt will perform bed mobility to L and R with mod A PT Short Term Goal 1 - Progress (Week 1): Met PT Short Term Goal 2 (Week 1): Pt will perform stand pivot transfers to L and R with consistent mod A PT Short Term Goal 2 - Progress (Week 1): Met PT Short Term Goal 3 (Week 1): Pt will perform ambulation 25' with mod A, LRAD and begin trials of use of service dog PT Short Term Goal 3 - Progress (Week 1): Partly met PT Short Term Goal 4 (Week 1): Pt will negotiate up/down ramp with mod A and LRAD PT Short Term Goal 4 - Progress  (Week 1): Met PT Short Term Goal 5 (Week 1): Pt will tolerate 5 min functional activities in standing for dynamic standing balance training PT Short Term Goal 5 - Progress (Week 1): Partly met Week 2:  PT Short Term Goal 1 (Week 2): = LTG for D/C 08/23/14  Skilled Therapeutic Interventions/Progress Updates:   Pt received in w/c after SLP session.  Pt reporting minimal to no dizziness with bed mobility today but reporting significant, 8/10 pain in L temple/face-hypersensitive to touch-RN notified and aware of pain.  Pt willing to participate in therapy session.  Husband assisted with lower and upper body dressing for pt comfort when leaving room; pt performed sit > stand from w/c and stood with min A for balance while pulling up under garments and pants with one episode of posterior LOB, able to recover with min A.  Transported to gym in w/c total A and performed ambulation and simulated car transfer with RW and min A with verbal and manual cues for guidance around obstacles.  Pt required mod A to bring each LE into and out of car.  Vitals assessed during seated rest break due to pt continuing to c/o head pain and SOB, all WFL.  Continued with gait training in controlled environment with pt use of wall rail on R and service dog in LUE x 15' with min A for balance to begin to re-introduce use of service dog for gait in home and community.  Pt fatigued quickly and requested to return to sitting.  Returned to room in w/c and set up in w/c to await OT for B&D.  Husband is anxious to begin to assist pt more with mobility.  Will begin hands on training next week.    Therapy Documentation Precautions:  Precautions Precautions: Fall Precaution Comments: Pt blind in both eyes; bilat prosthetic eyes  Restrictions Weight Bearing Restrictions: No Vital Signs: 138/75, 101 bpm, 97.9 temp, 97% on RA Pain: Pain Assessment Pain Assessment: 0-10 Pain Score: 6  Pain Location: Head Pain Orientation: Left Pain  Descriptors / Indicators: Aching;Headache Pain Intervention(s): RN made aware Locomotion : Ambulation Ambulation/Gait Assistance: 4: Min assist   See FIM for current functional status  Therapy/Group: Individual Therapy  Raylene Everts [facility] 08/16/2014, 12:32 PM
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
## 2014-08-16 NOTE — Progress Notes (Signed)
Speech Language Pathology Daily Session Note  Patient Details  Name: Jaime [name] MRN: [id number] Date of Birth: June 12, 1927  Today's Date: 08/16/2014 SLP Individual Time: 1400-1500 SLP Individual Time Calculation (min): 60 min  Short Term Goals: Week 2: SLP Short Term Goal 1 (Week 2): Patient will consume least restrictive diet without any s/s of aspiration with supervision  SLP Short Term Goal 2 (Week 2): Pt will identify 2 physical deficits and 2 new cognitive deficits with min A given question cues. SLP Short Term Goal 3 (Week 2): Pt will be oriented place, time, situation with supervision cues for use of external aids.  SLP Short Term Goal 3 - Progress (Week 2): Progressing toward goal SLP Short Term Goal 4 (Week 2): Pt will follow 3 step commands within functional tasks with min A given verbal cues. SLP Short Term Goal 4 - Progress (Week 2): Progressing toward goal SLP Short Term Goal 5 (Week 2): Pt will recall biographical/new information with functional tasks with min A given verbal/question cues. SLP Short Term Goal 5 - Progress (Week 2): Progressing toward goal  Skilled Therapeutic Interventions: Pt participated in SLP intervention, focusing on cognition and swallowing.  Pt oriented to elements of place, time, and situation using her watch independently.  Discussed association strategies to facilitate retrieval of new information.  Using linking mnemonics, creating a preparatory set for recall, and using verbal repetition enhanced auditory recall of paragraph information and  non-related word lists to 90% accuracy.  Pt states that memory has always been a strength for her - encouraged her that she is demonstrating progress. Reviewed results of last MBS two days ago - pt denied swallowing difficulty; we discussed concerns for silent aspiration as observed per study.  Pt completed oral care with min assist for set-up and completion.  She consumed trial boluses of thin liquid (water)  with min cues for intermittent throat clear, small sips, slow rate.   Pt progressing well toward goals.     FIM:  Comprehension Comprehension Mode: Auditory Comprehension: 4-Understands basic 75 - 89% of the time/requires cueing 10 - 24% of the time Expression Expression Mode: Verbal Expression: 5-Expresses complex 90% of the time/cues < 10% of the time Social Interaction Social Interaction: 6-Interacts appropriately with others with medication or extra time (anti-anxiety, antidepressant). Problem Solving Problem Solving: 4-Solves basic 75 - 89% of the time/requires cueing 10 - 24% of the time Memory Memory: 4-Recognizes or recalls 75 - 89% of the time/requires cueing 10 - 24% of the time FIM - Eating Eating Activity: 5: Supervision/cues;5: Set-up assist for open containers  Pain Pain Assessment Pain Assessment: No/denies pain Pain Score: 6  Pain Location: Head Pain Orientation: Left Pain Descriptors / Indicators: Aching;Headache Pain Intervention(s): RN made aware  Therapy/Group: Individual Therapy  Blenda MountsCouture, Kairah Leoni Laurice 08/16/2014, 3:19 PM
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
## 2014-08-17 ENCOUNTER — Inpatient Hospital Stay (HOSPITAL_COMMUNITY): Payer: Medicare PPO

## 2014-08-17 ENCOUNTER — Inpatient Hospital Stay (HOSPITAL_COMMUNITY): Payer: Medicare PPO | Admitting: Physical Therapy

## 2014-08-17 DIAGNOSIS — S065X9S Traumatic subdural hemorrhage with loss of consciousness of unspecified duration, sequela: Secondary | ICD-10-CM

## 2014-08-17 DIAGNOSIS — R05 Cough: Secondary | ICD-10-CM

## 2014-08-17 LAB — CBC WITH DIFFERENTIAL/PLATELET
Basophils Absolute: 0 10*3/uL (ref 0.0–0.1)
Basophils Relative: 0 % (ref 0–1)
EOS PCT: 1 % (ref 0–5)
Eosinophils Absolute: 0.1 10*3/uL (ref 0.0–0.7)
HCT: 28.5 % — ABNORMAL LOW (ref 36.0–46.0)
Hemoglobin: 9 g/dL — ABNORMAL LOW (ref 12.0–15.0)
LYMPHS ABS: 1.9 10*3/uL (ref 0.7–4.0)
Lymphocytes Relative: 21 % (ref 12–46)
MCH: 31.1 pg (ref 26.0–34.0)
MCHC: 31.6 g/dL (ref 30.0–36.0)
MCV: 98.6 fL (ref 78.0–100.0)
MONO ABS: 0.6 10*3/uL (ref 0.1–1.0)
Monocytes Relative: 7 % (ref 3–12)
NEUTROS ABS: 6.4 10*3/uL (ref 1.7–7.7)
Neutrophils Relative %: 71 % (ref 43–77)
Platelets: 395 10*3/uL (ref 150–400)
RBC: 2.89 MIL/uL — AB (ref 3.87–5.11)
RDW: 22.6 % — ABNORMAL HIGH (ref 11.5–15.5)
WBC: 9 10*3/uL (ref 4.0–10.5)

## 2014-08-17 MED ORDER — MOMETASONE FURO-FORMOTEROL FUM 100-5 MCG/ACT IN AERO
2.0000 | INHALATION_SPRAY | Freq: Two times a day (BID) | RESPIRATORY_TRACT | Status: DC
  Administered 2014-08-17 – 2014-08-23 (×13): 2 via RESPIRATORY_TRACT
  Filled 2014-08-17: qty 8.8

## 2014-08-17 NOTE — Plan of Care (Signed)
Problem: RH Dressing Goal: LTG Patient will perform lower body dressing w/assist (OT) LTG: Patient will perform lower body dressing with assist, with/without cues in positioning using equipment (OT)  Downgraded 5/7 secondary to slow progress in therapy.
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
## 2014-08-18 ENCOUNTER — Inpatient Hospital Stay (HOSPITAL_COMMUNITY): Payer: Medicare PPO

## 2014-08-18 DIAGNOSIS — S065X1D Traumatic subdural hemorrhage with loss of consciousness of 30 minutes or less, subsequent encounter: Secondary | ICD-10-CM

## 2014-08-18 DIAGNOSIS — D62 Acute posthemorrhagic anemia: Secondary | ICD-10-CM

## 2014-08-18 MED ORDER — OXYCODONE-ACETAMINOPHEN 5-325 MG PO TABS
1.0000 | ORAL_TABLET | Freq: Once | ORAL | Status: AC
  Administered 2014-08-18: 1 via ORAL
  Filled 2014-08-18: qty 1

## 2014-08-18 MED ORDER — OXYCODONE HCL 5 MG PO TABS
5.0000 mg | ORAL_TABLET | Freq: Once | ORAL | Status: DC
  Filled 2014-08-18: qty 1
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
## 2014-08-18 NOTE — Progress Notes (Signed)
Occupational Therapy Session Note  Patient Details  Name: Jaime [name] MRN: [id number] Date of Birth: [birth year]  Today's Date: 08/18/2014 OT Individual Time: 0700-0800 OT Individual Time Calculation (min): 60 min    Short Term Goals: Week 2:  OT Short Term Goal 1 (Week 2): STGs=LTGs due to discharge set 5/13  Skilled Therapeutic Interventions/Progress Updates:    Pt seen for ADL retraining with focus on postural control, standing balance, functional transfers, and activity tolerance. Pt received supine in bed. Completed supine>sit with min A and mod cues for sequencing. Pt reporting mild dizziness upon sitting, however quickly resolved. Completed stand pivot transfers bed>w/c, w/c<>toilet and w/c<>shower chair with steadying assist and hand over hand assist for locating grab bars and arm rests. Pt completed bathing with steadying assist for standing balance and use of LH sponge for BLEs. Pt completed dressing sit<>stand from w/c with min A dynamic standing balance and slight LOB posteriorly on one occasion, requiring mod A to correct. Utilized stool for LB dressing with pt able to thread RLE only. Pt managed buttons of front closure shirt with increased time and no cues. Pt left sitting in w/c with RN and NT present.   Therapy Documentation Precautions:  Precautions Precautions: Fall Precaution Comments: Pt blind in both eyes; bilat prosthetic eyes  Restrictions Weight Bearing Restrictions: No General:   Vital Signs: Therapy Vitals Temp: 98.2 F (36.8 C) Temp Source: Oral Pulse Rate: 90 Resp: 18 BP: 120/61 mmHg Patient Position (if appropriate): Lying Oxygen Therapy SpO2: 95 % O2 Device: Not Delivered Pain:   ADL:   Exercises:   Other Treatments:    See FIM for current functional status  Therapy/Group: Individual Therapy  Daneil Danerkinson, Jaime [name] 08/18/2014, 7:57 AM

## 2014-08-19 ENCOUNTER — Inpatient Hospital Stay (HOSPITAL_COMMUNITY): Payer: Medicare PPO | Admitting: Physical Therapy

## 2014-08-19 ENCOUNTER — Encounter (HOSPITAL_COMMUNITY): Payer: BC Managed Care – PPO

## 2014-08-19 ENCOUNTER — Inpatient Hospital Stay (HOSPITAL_COMMUNITY): Payer: BC Managed Care – PPO

## 2014-08-19 ENCOUNTER — Inpatient Hospital Stay (HOSPITAL_COMMUNITY): Payer: BC Managed Care – PPO | Admitting: Speech Pathology

## 2014-08-19 ENCOUNTER — Inpatient Hospital Stay (HOSPITAL_COMMUNITY): Payer: BC Managed Care – PPO | Admitting: Physical Therapy

## 2014-08-19 DIAGNOSIS — J209 Acute bronchitis, unspecified: Secondary | ICD-10-CM
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
## 2014-08-19 NOTE — Progress Notes (Signed)
Physical Therapy Session Note  Patient Details  Name: Jaime [name] MRN: [id number] Date of Birth: 1927/05/02  Today's Date: 08/19/2014 PT Individual Time: 1015-1115 PT Individual Time Calculation (min): 60 min   Short Term Goals: Week 2:  PT Short Term Goal 1 (Week 2): = LTG for D/C 08/23/14      Therapy Documentation Precautions:  Precautions Precautions: Fall Precaution Comments: Pt blind in both eyes; bilat prosthetic eyes  Restrictions Weight Bearing Restrictions: No  Sit to and from stand transfer with rolling walker contact guard  Patient ambulated three trials 35, 45, 60 feet with rolling walker an min assist. Patient ambulated with an antalgic gait decreased step length with left lower extremity. Patient requires assistance with rolling walker management. Verbal cues for pacing environmental awareness of obstacle negotiation. Patient limited by endurance and pain.  Patient up and down 4 steps with bilateral handrails mod assist. Patient descended stairs backwards. Patient educated on proper sequence and technique. Patient limited by fatigue and increased anxiety with stair negotiation.  Seated there ex: Bilateral long arc quads 30x Bilateral hip flexion 30x  Standing ther ex with RW: Bilateral marching 30x Mini squats 15x Bilateral heel raises 15x  Patient limited by fatigue and pain during session. Patient initially reports 8 out of 10 pain in right hip and head. Patient received pain medication earlier from RN and was not due for any pain medication. Patient reports pain decreased to a 6 out of 10 by the end of the session with this PT. Patient did not report any symptoms of dizziness throughout session.  Patient requires frequent rest breaks throughout session. Patient returned to room at end of session with all needs met and husband present. Husband present throughout session for observation.    Therapy/Group: Individual Therapy  Retta Diones 08/19/2014,  3:19 PM

## 2014-08-19 NOTE — Plan of Care (Signed)
Problem: RH PAIN MANAGEMENT Goal: RH STG PAIN MANAGED AT OR BELOW PT'S PAIN GOAL Rate pain <3/10  Outcome: Not Progressing Pt not understanding management of pain medications

## 2014-08-19 NOTE — Progress Notes (Signed)
Occupational Therapy Session Note  Patient Details  Name: Jaime [name] MRN: [id number] Date of Birth: 1927-04-13  Today's Date: 08/19/2014 OT Individual Time: 0900-1000 and 1400-1500 OT Individual Time Calculation (min): 60 min and 60 min     Short Term Goals: Week 2:  OT Short Term Goal 1 (Week 2): STGs=LTGs due to discharge set 5/13  Skilled Therapeutic Interventions/Progress Updates:    Session 1: Pt seen for 1:1 OT session with focus on functional transfers, standing balance, activity tolerance, and family training. Pt received sitting in w/c declining B&D this AM. Husband agreeable to practice hands on training for toilet transfers and bed<>w/c with RW. Husband reported he had been completing transfers and discussed safety plan and fall risk. Therapist assist pt with stand pivot transfer w/c<>toilet and husband safety returned demonstration with min-SBA for balance. Husband assisted with standing balance min-SBA during toileting task with cues from therapist to allow pt to complete task to her ability. Practiced stand pivot transfer w/c<>bed with RW at min A with husband safely returning demonstration. Therapist checked husband off on safety plan for transfers. Engaged in standing balance/tolerance activity in kitchen with pt standing at countertop to retrieve items from overhead cabinet for 1-2 min for 3 trails with rest breaks between each. Pt required SBA for standing balance during activity. Practiced stand pivot transfer w/c>couch with SBA using RW then pt required min A sit>stand from couch and pivoting back to w/c. Pt required mod cues for sequencing and safety during furniture transfer. At end of session pt returned to room and left sitting in w/c with hot pack applied. Pt with increased back pain and nausea during session, limiting pt's participation. RN notified.   Session 2: Pt seen for 1:1 OT session with focus on functional mobility, standing balance, BUE strengthening, and  activity tolerance. Pt received supine in bed with pt and husband voicing concern in regards to medications. PA provided education. Pt completed supine>sit with mod A secondary to low back pain. Pt required long rest break then ambulated bed>bathroom at min A using RW and max cues for maneuvering around obstacles. Pt completed toileting at supervision level for balance then ambulated to sink to complete hand hygiene in standing with supervision. Engaged in functional mobility in hallway utilizing RW. Pt ambulated approx 40' 2x with CGA-SBA and mod cues for direction. Pt fatigued quickly, requiring long rest breaks. Completed BUE strengthening exercises using 3# ball with focus on shoulder and bicep strengthening requiring for sit<>stand and standing balance. Pt completed 10 reps of each exercise, keeping count with min cues. At end of session pt left sitting in w/c with husband and friend present.   Therapy Documentation Precautions:  Precautions Precautions: Fall Precaution Comments: Pt blind in both eyes; bilat prosthetic eyes  Restrictions Weight Bearing Restrictions: No General:   Vital Signs:   Pain: Pt with increased pain in lower back; RN provided pain meds  Other Treatments:    See FIM for current functional status  Therapy/Group: Individual Therapy  Jaime [name], Jaime GuardianKayla [name] 08/19/2014, 10:06 AM

## 2014-08-19 NOTE — Progress Notes (Signed)
Speech Language Pathology Daily Session Note  Patient Details  Name: Jaime [name] MRN: [id number] Date of Birth: 1927-04-29  Today's Date: 08/19/2014 SLP Individual Time: 0800-0900 SLP Individual Time Calculation (min): 60 min  Short Term Goals: Week 2: SLP Short Term Goal 1 (Week 2): Patient will consume least restrictive diet without any s/s of aspiration with supervision  SLP Short Term Goal 1 - Progress (Week 2): Progressing toward goal SLP Short Term Goal 2 (Week 2): Pt will identify 2 physical deficits and 2 new cognitive deficits with min A given question cues. SLP Short Term Goal 2 - Progress (Week 2): Progressing toward goal SLP Short Term Goal 3 (Week 2): Pt will be oriented place, time, situation with supervision cues for use of external aids.  SLP Short Term Goal 3 - Progress (Week 2): Progressing toward goal SLP Short Term Goal 4 (Week 2): Pt will follow 3 step commands within functional tasks with min A given verbal cues. SLP Short Term Goal 4 - Progress (Week 2): Progressing toward goal SLP Short Term Goal 5 (Week 2): Pt will recall biographical/new information with functional tasks with min A given verbal/question cues. SLP Short Term Goal 5 - Progress (Week 2): Progressing toward goal  Skilled Therapeutic Interventions: Skilled ST session focused on cognitive and swallowing goals. Pt was observed during breakfast of dys 3 solids, nectar thick liquids. Pt continues to exhibit intermittent, relatively nonproductive cough, which (per MBS) is not a reliable indicator of penetration or aspiration. Pt appeared to tolerate small sips of thin liquid (coffee) without overt reflexive cough. Pt oriented to place, time, situation without cues required. She required ModA cueing to recall acquisition of her talking watch last week, but recalled without cue her DC plan for later this week. Recommend repeating MBS prior to DC to determine appropriateness for advancing liquids to thin,  given continued cough.   FIM:  Comprehension Comprehension Mode: Auditory Comprehension: 4-Understands basic 75 - 89% of the time/requires cueing 10 - 24% of the time Expression Expression Mode: Verbal Expression: 5-Expresses complex 90% of the time/cues < 10% of the time Social Interaction Social Interaction: 6-Interacts appropriately with others with medication or extra time (anti-anxiety, antidepressant). Problem Solving Problem Solving: 4-Solves basic 75 - 89% of the time/requires cueing 10 - 24% of the time Memory Memory: 4-Recognizes or recalls 75 - 89% of the time/requires cueing 10 - 24% of the time FIM - Eating Eating Activity: 5: Supervision/cues;5: Set-up assist for open containers  Pain Pain Assessment Pain Assessment: 0-10 Pain Score: 4  Pain Type: Acute pain Pain Location: Back Pain Orientation: Left Pain Descriptors / Indicators: Aching Pain Onset: On-going Pain Intervention(s): MD notified (Comment) (Pt says she cant take oxycodone. MD notified)  Therapy/Group: Individual Therapy   Celia B. LongvilleBueche, [facility] LLCMSP, CCC-SLP [phone]  Leigh AuroraBueche, Celia Brown 08/19/2014, 11:47 AM

## 2014-08-19 NOTE — Progress Notes (Signed)
Pt is complaining of nausea and discomfort from the percocet that was given this morning. Pt explained to me that oxycodone makes her sick. Pt said percocet helped with her pain yesterday but is not helping with her pain today

## 2014-08-19 NOTE — Progress Notes (Signed)
Physical Therapy Session Note  Patient Details  Name: Jaime Spittleatricia S Mccrumb MRN: [id number] Date of Birth: 05/14/1927  Today's Date: 08/19/2014 PT Individual Time: 1503-1528 PT Individual Time Calculation (min): 25 min   Short Term Goals: Week 2:  PT Short Term Goal 1 (Week 2): = LTG for D/C 08/23/14  Skilled Therapeutic Interventions/Progress Updates:   Patient received sitting in wheelchair, reporting fatigue but stated she was "willing to get this over with." Patient declined any ambulation this session. Performed stand pivot transfer w/c <> NuStep with S-min A and mod verbal cues for sequencing. Performed NuStep using BUE/BLE at level 1 x 10 min for activity tolerance and generalized strengthening, pillow behind back for comfort. Patient required increased time to complete all transfers due to increased LLE and back pain. Patient returned to room and left sitting in wheelchair with quick release belt on and family in room.     Therapy Documentation Precautions:  Precautions Precautions: Fall Precaution Comments: Pt blind in both eyes; bilat prosthetic eyes  Restrictions Weight Bearing Restrictions: No Pain: Pain Assessment Pain Assessment: Faces Pain Score: 5  Faces Pain Scale: Hurts whole lot Pain Type: Acute pain Pain Location: Leg Pain Orientation: Left Pain Descriptors / Indicators: Tender;Aching;Grimacing Pain Onset: With Activity Pain Intervention(s): Repositioned;Ambulation/increased activity   See FIM for current functional status  Therapy/Group: Individual Therapy  Kerney ElbeVarner, Riaan Toledo A 08/19/2014, 3:45 PM

## 2014-08-20 ENCOUNTER — Inpatient Hospital Stay (HOSPITAL_COMMUNITY): Payer: Medicare PPO | Admitting: Physical Therapy

## 2014-08-20 ENCOUNTER — Inpatient Hospital Stay (HOSPITAL_COMMUNITY): Payer: Medicare PPO

## 2014-08-20 ENCOUNTER — Inpatient Hospital Stay (HOSPITAL_COMMUNITY): Payer: BC Managed Care – PPO | Admitting: Speech Pathology

## 2014-08-20 DIAGNOSIS — S065X4S Traumatic subdural hemorrhage with loss of consciousness of 6 hours to 24 hours, sequela: Secondary | ICD-10-CM

## 2014-08-20 NOTE — Patient Care Conference (Signed)
Inpatient RehabilitationTeam Conference and Plan of Care Update Date: 08/20/2014   Time: 3:10 PM    Patient Name: Jaime [name]      Medical Record Number: [id number]  Date of Birth: [birth year] Sex: Female         Room/Bed: 4W17C/4W17C-01 Payor Info: Payor: [employer] MEDICARE / Plan: [employer] MEDICARE CHOICE PPO / Product Type: *No Product type* /    Admitting Diagnosis: TRAUMATIC L SDH  Admit Date/Time:  08/09/2014  7:49 PM Admission Comments: No comment available   Primary Diagnosis:  <principal problem not specified> Principal Problem: <principal problem not specified>  Patient Active Problem List   Diagnosis Date Noted  . SDH (subdural hematoma) 08/09/2014  . Agitation   . Encephalopathy acute   . Encounter for orogastric (OG) tube placement   . Blind   . HOH (hard of hearing)   . Acute respiratory failure   . Acute respiratory failure with hypoxemia   . Traumatic brain injury 07/30/2014  . Subdural hemorrhage following injury 07/30/2014  . Subdural hematoma 07/30/2014  . Postop Hypoxia 10/22/2011  . Postoperative anemia 10/20/2011  . Postop Transfusion 10/20/2011  . Postop Hyponatremia 10/20/2011  . OA (osteoarthritis) of knee 10/18/2011    Expected Discharge Date: Expected Discharge Date: 08/23/14  Team Members Present: Physician leading conference: Dr. Faith RogueZachary Swartz Social Worker Present: Amada JupiterLucy Jailyne Chieffo, LCSW Nurse Present: Carmie EndAngie Joyce, RN PT Present: Bayard Huggerebecca Varner, PT OT Present: Ardis Rowanom Lanier, COTA;Jennifer Fredrich RomansSmith, OT;Kayla Perkinson, OT SLP Present: Feliberto Gottronourtney Payne, SLP Other (Discipline and Name): Ottie GlazierBarbara Boyette, RN [facility]) PPS Coordinator present : Tora DuckMarie Noel, RN, [facility] LLCCRRN     Current Status/Progress Goal Weekly Team Focus  Medical   pain at times. improve po intake  increase spatial awareness and safety  pain, wound care   Bowel/Bladder   continent of bowel and bladder. LBM 08/19/14  continent of bowel and bladder  offer bsc frequently   Swallow/Nutrition/  Hydration   dys 3 textures, nectar thick liquids, min assist-supervision for use of swallowing precautions, trials of thin liquids to continue working towards liquids progression   Supervision   repeat MBS prior to discharge to determine readiness for liquids advancement   ADL's   min A overall; max-total A LB dressing  min A bathing, tub transfers, mod A LB dressing, supervision UB self-care and toilet transfers  functional transfers, strengthening, family training, standing balance, activity tolerance, coordination   Mobility   Min A overall   Supervision overall with LRAD and service dog  Activity tolerance/endurance, use of service dog for gait, standing balance, ramp negotiation   Communication   Min assist  supervision   word finding, reading comprehension   Safety/Cognition/ Behavioral Observations  Min assist   supervision- min assist   recall of new information, problem solving, safety awareness.     Pain   Scheduled tyelenol; pain reported in back.   <4  Monitor for effectiveness of tyelenol and K pad for back pain relief   Skin   Left cranial incision approximeated, OTA, Healing. Skin tear to right wrist. Graulation tissue noted. Tegaderm in place.   no additional skin breakdown  assess q shift for appropriate healing    Rehab Goals Patient on target to meet rehab goals: Yes *See Care Plan and progress notes for long and short-term goals.  Barriers to Discharge: premorbid behaviors, fatigue    Possible Resolutions to Barriers:  see prior    Discharge Planning/Teaching Needs:  home with husband who can provide 24/7 assistance  Team Discussion:  Pt and husband can prove difficult/ resistant with staff at times.  Fatigues very quickly.  Min to SBA with ADLs.  Good progress with PT and approaching supervision/ min assist goals.  Have begun including husband and service dog into treatments.  On target for end of week d/c.  Revisions to Treatment Plan:  None    Continued Need for Acute Rehabilitation Level of Care: The patient requires daily medical management by a physician with specialized training in physical medicine and rehabilitation for the following conditions: Daily direction of a multidisciplinary physical rehabilitation program to ensure safe treatment while eliciting the highest outcome that is of practical value to the patient.: Yes Daily medical management of patient stability for increased activity during participation in an intensive rehabilitation regime.: Yes Daily analysis of laboratory values and/or radiology reports with any subsequent need for medication adjustment of medical intervention for : Post surgical problems;Neurological problems  Yovani Cogburn 08/20/2014, 4:48 PM
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
## 2014-08-20 NOTE — Progress Notes (Signed)
Occupational Therapy Session Note  Patient Details  Name: Jaime [name] MRN: [id number] Date of Birth: 1927/05/01  Today's Date: 08/20/2014 OT Individual Time: [phone] and 1100-1200 and 1330-1400 OT Individual Time Calculation (min): 58 min and 60 min and 30 min    Short Term Goals: Week 2:  OT Short Term Goal 1 (Week 2): STGs=LTGs due to discharge set 5/13  Skilled Therapeutic Interventions/Progress Updates:    Session 1: Pt seen for ADL retraining with focus on LB dressing, functional mobility, standing balance, and activity tolerance. Pt received sitting in w/c reporting pic line was being removed today. Pt required max cues to participate as she was requesting to get it removed immediately. Educated that IV team must remove pic line and pt can return to bed once IV nurse arrives. Pt completed bathing at shower level, ambulating to bathroom with min guard-SBA using RW and cues for obstacle negotiation. Pt completed bathing with min A for buttocks hygiene for thoroughness. Completed dressing from shower chair as pt demonstrates improved anterior weight shift and sitting balance vs completing in w/c. Pt required mod cues of encouragement for attempting LB dressing with pt demonstrating improved independence with threading BLEs. Pt required SBA for standing balance during LB dressing for clothing management around waist. Pt ambulated bathroom>bed with SBA using RW then completed sit>supine with mod A. Pt left supine in bed awaiting IV team to remove pic line.  Session 2: Pt seen for 1:1 OT session with focus on dynamic standing balance, activity tolerance, and functional mobility. Pt received sitting in w/c requesting to toilet. Ambulated to/from bathroom with CGA-SBA using RW. Pt completed toilet task at supervision level and recall to not stand from toilet without therapist present. Engaged in laundry task sit<>stand from w/c level with supervision overall to complete. Pt stood to  place/remove towels from washing machine then placed/removed them into dryer. Pt stood at machines to fold towels with SBA for balance. Pt required rest break in sitting following each towel she folded. Engaged in plant watering activity in standing with min guard-SBA for balance. Pt completed side-stepping L<>R with RW to water several plants with no LOB noted. Pt with improved standing tolerance during activity as she stood for approx 8 min to complete. Pt ambulated approx 50' towards room with CGA-SBA using RW and cues for obstacle negotiation. Pt left sitting in w/c with all needs in reach and husband present. After speaking with PT, quick release belt removed and safety plan marked accordingly.   Session 3: Pt seen for 1:1 OT session with focus on functional mobility, activity tolerance, and BUE strengthening. Pt received supine in bed. Completed supine>sit with mod A due to fatigue and low back pain. Pt ambulated bed>toilet with min-SBA using RW and cues for obstacle negotiation. Pt completed toileting task with steadying assist for standing balance and mod A for thoroughness of hygiene. Pt completed hand hygiene in standing with supervision for balance. Engaged in BUE strengthening activities using 3# dowel bar with focus on shoulder and bicep strengthening. Pt kept count of reps during activity with 100% accuracy. Pt required rest breaks after every 2 exercises due to fatigue. At end of session pt left sitting in w/c with all needs in reach and husband present.   Therapy Documentation Precautions:  Precautions Precautions: Fall Precaution Comments: Pt blind in both eyes; bilat prosthetic eyes  Restrictions Weight Bearing Restrictions: No General:   Vital Signs:  Pain: No report of pain  Other Treatments:    See  FIM for current functional status  Therapy/Group: Individual Therapy  Daneil Danerkinson, Dru Laurel N 08/20/2014, 9:58 AM

## 2014-08-20 NOTE — Progress Notes (Signed)
Speech Language Pathology Daily Session Note  Patient Details  Name: Shaune Spittleatricia S [place] MRN: [id number] Date of Birth: [birth year]  Today's Date: 08/20/2014 SLP Individual Time: 0800-0900 SLP Individual Time Calculation (min): 60 min  Short Term Goals: Week 2: SLP Short Term Goal 1 (Week 2): Patient will consume least restrictive diet without any s/s of aspiration with supervision  SLP Short Term Goal 1 - Progress (Week 2): Progressing toward goal SLP Short Term Goal 2 (Week 2): Pt will identify 2 physical deficits and 2 new cognitive deficits with min A given question cues. SLP Short Term Goal 2 - Progress (Week 2): Progressing toward goal SLP Short Term Goal 3 (Week 2): Pt will be oriented place, time, situation with supervision cues for use of external aids.  SLP Short Term Goal 3 - Progress (Week 2): Progressing toward goal SLP Short Term Goal 4 (Week 2): Pt will follow 3 step commands within functional tasks with min A given verbal cues. SLP Short Term Goal 4 - Progress (Week 2): Progressing toward goal SLP Short Term Goal 5 (Week 2): Pt will recall biographical/new information with functional tasks with min A given verbal/question cues. SLP Short Term Goal 5 - Progress (Week 2): Progressing toward goal  Skilled Therapeutic Interventions:  Pt was seen for skilled ST targeting goals for dysphagia and functional word finding.  Upon arrival, pt was seated upright in wheelchair, awake, alert, and consuming thin liquid coffee with breakfast.  SLP provided skilled education that trials of any thin liquids were to be done with SLP only until a repeat MBS can be completed.  Pt became upset with therapist and reported that she is "swallowing fine," and that food and liquids don't "go the wrong way."  SLP provided RE-education about unreliable cough response and decreased sensation of aspiration/penetration per MBS.  SLP facilitated the session with trials of thin liquids to continue working  towards liquids advancement.  Pt presented with no overt s/s of aspiration and her vocal quality remained clear with 4 sips of coffee.  Recommend a repeat MBS tomorrow to determine readiness for liquids advancement.  SLP also facilitated the session with a structured generative naming task targeting use of word finding strategies as pt was noted to have increased difficulty conveying immediate needs and wants with verbal agitation.  Pt generated the names of items in a given category with min assist semantic cues.  Pt left in room, upright in wheelchair, husband present, all needs left within reach.  Continue per current plan of care.    FIM:  Comprehension Comprehension Mode: Auditory Comprehension: 5-Understands basic 90% of the time/requires cueing < 10% of the time Expression Expression Mode: Verbal Expression: 4-Expresses basic 75 - 89% of the time/requires cueing 10 - 24% of the time. Needs helper to occlude trach/needs to repeat words. Social Interaction Social Interaction: 5-Interacts appropriately 90% of the time - Needs monitoring or encouragement for participation or interaction. Problem Solving Problem Solving: 4-Solves basic 75 - 89% of the time/requires cueing 10 - 24% of the time Memory Memory: 3-Recognizes or recalls 50 - 74% of the time/requires cueing 25 - 49% of the time FIM - Eating Eating Activity: 5: Supervision/cues;5: Set-up assist for open containers  Pain Pain Assessment Pain Assessment: No/denies pain  Therapy/Group: Individual Therapy  Soledad Budreau, Melanee SpryNicole L 08/20/2014, 12:19 PM

## 2014-08-20 NOTE — Plan of Care (Signed)
Problem: RH Ambulation Goal: LTG Patient will ambulate in community environment (PT) LTG: Patient will ambulate in community environment, # of feet with assistance (PT).  Outcome: Not Applicable Date Met:  72/25/75 D/C 08/20/14 due to slow progress

## 2014-08-21 ENCOUNTER — Inpatient Hospital Stay (HOSPITAL_COMMUNITY): Payer: BC Managed Care – PPO

## 2014-08-21 ENCOUNTER — Inpatient Hospital Stay (HOSPITAL_COMMUNITY): Payer: BC Managed Care – PPO | Admitting: Occupational Therapy

## 2014-08-21 ENCOUNTER — Inpatient Hospital Stay (HOSPITAL_COMMUNITY): Payer: Medicare PPO

## 2014-08-21 ENCOUNTER — Encounter (HOSPITAL_COMMUNITY): Payer: BC Managed Care – PPO | Admitting: Speech Pathology

## 2014-08-21 MED ORDER — SENNOSIDES-DOCUSATE SODIUM 8.6-50 MG PO TABS
2.0000 | ORAL_TABLET | Freq: Two times a day (BID) | ORAL | Status: DC
  Administered 2014-08-21 – 2014-08-23 (×3): 2 via ORAL
  Filled 2014-08-21 (×4): qty 2

## 2014-08-21 NOTE — Progress Notes (Signed)
Occupational Therapy Session Note  Patient Details  Name: Jaime Spittleatricia S Rantz MRN: [id number] Date of Birth: 21-Mar-1928  Today's Date: 08/21/2014 OT Individual Time: 1100-1200 and 1300-1355 OT Individual Time Calculation (min): 60 min and 55 min    Short Term Goals: Week 2:  OT Short Term Goal 1 (Week 2): STGs=LTGs due to discharge set 5/13  Skilled Therapeutic Interventions/Progress Updates:    Session 1: Pt seen for 1:1 OT session with focus on dynamic standing balance, functional mobility, family training, and activity tolerance. Pt received supine in bed upset over just now getting food upgraded due to swallow precautions. Attempted to educate pt on reasoning behind dys.2 diet and thickened liquids however pt declining any deficits. Able to redirect pt to current therapy session with mod cues. Pt sat EOB and therapist donned shoes. Husband assisted pt to bathoom via ambulation with RW and min cues for positioning of husband. Pt completed toileting at supervision and ambulated toilet>w/c at supervision level using RW. Pt stood outside at elevated flower beds and water plants for approx 1-2 min 2x while side stepping at supervision level. Engaged in functional mobility on outdoor surface (concrete and brick) with husband providing SBA-CGA for 40', 50', and 60'. [name] providing appropriate cues for transitioning from concrete<>brick surface. Pt fatigued quickly throughout session requiring multiple rest breaks. Pt left sitting in w/c with husband present and all needs in reach.   Session 2: Pt seen for 1:1 OT session with focus on bed mobility, education, functional transfers, and activity tolerance. Pt received sitting in w/c questioning previous swallow precautions. Provided brief education then redirected pt to therapeutic activities. Pt requesting to toilet therefore ambulated w/c>toilet at supervision level using RW with cues for obstacle negotiation. Pt completed toilet task with  steadying assist for standing balance during clothing management. Pt ambulated to sink and completed hand hygiene in standing with cues for upright posture and pt flexed over sink. Pt inquiring about craniotomy with therapist providing education. Pt initiating learning to open/close RW to assist Bill at home. Provided hand over hand assist initially with verbal cues. Pt practiced multiple times and able to complete on last trial with verbal cues only. Practiced bed mobility in ADL apartment with pt completing at supervision level. Returned to room and pt inquiring about management of leg rests to w/c. Provided hand over hand assist then pt returned demonstration of swinging leg rest on/off with increased time and cues only. Pt returned to room and transferred to bed at supervision level. Pt left supine in bed with all needs in reach. Pt very fatigued throughout session, requiring rest breaks following each therapeutic activity.   Therapy Documentation Precautions:  Precautions Precautions: Fall Precaution Comments: Pt blind in both eyes; bilat prosthetic eyes  Restrictions Weight Bearing Restrictions: No General:   Vital Signs: Oxygen Therapy O2 Device: Not Delivered Pain: Pain Assessment Pain Assessment: No/denies pain Pain Score: 0-No pain Pain Intervention(s): Medication (See eMAR) (scheduled pain medication)  See FIM for current functional status  Therapy/Group: Individual Therapy  Daneil Danerkinson, Masae Lukacs N 08/21/2014, 12:09 PM

## 2014-08-21 NOTE — Progress Notes (Signed)
Social Work Patient ID: Jaime SpittlePatricia S [name], female   DOB: 11-13-1927, 81 y.o.   MRN: [id number]  Have reviewed team conference with pt and husband and both report feeling ready for d/c end of week.  Pt states, "I'd go today if you let me."  Husband reports no concerns about managing at home.  Reviewed d/c referrals.  Continue to follow.  Jaime Worthey, LCSW

## 2014-08-21 NOTE — Progress Notes (Signed)
Occupational Therapy Session Note  Patient Details  Name: Jaime [name] MRN: [id number] Date of Birth: 09/11/1927  Today's Date: 08/21/2014 OT Individual Time: 1500-1557 OT Individual Time Calculation (min): 57 min    Short Term Goals: Week 2:  OT Short Term Goal 1 (Week 2): STGs=LTGs due to discharge set 5/13  Skilled Therapeutic Interventions/Progress Updates:   Upon entering the room, pt supine in bed resting with daughter, Jaime [name], present in room. Pt with no c/o pain but reports of fatigue from previous therapy sessions. Pt performing supine >sit with supervision. OT educating pt and family member on energy conservation techniques for self care and general principles. Specific examples discussed such as energy conservation when going to doctor's appointments in the future. Pt verbalizing understanding and activitly contributing to conversation as appropriate. Pt ambulating to bathroom with min guard with use of RW and verbal cues for direction secondary to visual limitations. Toilet transfer with supervision onto standard toilet. Pt able to perform clothing management and hygiene after BM with steady assist for safety. Pt ambulating to stand at sink and wash hands with supervision and cues to locate needed items. Pt requesting therapist to orient her to room. Pt standing and ambulating with RW to various areas in room and touch items with locate them. Pt then ambulating into hallway ~ 40 feet with RW and min guard assistance. Pt needing to have seated rest break secondary to fatigue before ambulating to return to room in same manner as above. Pt performed sit >supine with Min A and pushing through B LEs for positioning in bed. Bed alarm activated and call bell within reach with her daughter remaining in room.   Therapy Documentation Precautions:  Precautions Precautions: Fall Precaution Comments: Pt blind in both eyes; bilat prosthetic eyes  Restrictions Weight Bearing Restrictions:  No General: General OT Amount of Missed Time: 5 Minutes Vital Signs: Therapy Vitals Temp: 97.8 F (36.6 C) Temp Source: Oral Pulse Rate: 89 BP: 132/64 mmHg Patient Position (if appropriate): Lying Oxygen Therapy SpO2: 96 % O2 Device: Not Delivered Pain: Pain Assessment Pain Assessment: No/denies pain Pain Score: 0-No pain Faces Pain Scale: No hurt Pain Intervention(s): Medication (See eMAR) (scheduled pain medication)  See FIM for current functional status  Therapy/Group: Individual Therapy  Lowella Gripittman, Kameo Bains L 08/21/2014, 4:47 PM
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
## 2014-08-21 NOTE — Discharge Instructions (Signed)
Inpatient Rehab Discharge Instructions  Jaime [name] Discharge date and time: No discharge date for patient encounter.   Activities/Precautions/ Functional Status: Activity: activity as tolerated Diet:  Wound Care: keep wound clean and dry Functional status:  ___ No restrictions     ___ Walk up steps independently _x__ 24/7 supervision/assistance   ___ Walk up steps with assistance ___ Intermittent supervision/assistance  ___ Bathe/dress independently ___ Walk with walker     ___ Bathe/dress with assistance ___ Walk Independently    ___ Shower independently _x__ Walk with assistance    ___ Shower with assistance ___ No alcohol     ___ Return to work/school ________    COMMUNITY REFERRALS UPON DISCHARGE:    Home Health:   PT     OT                     Agency: [facility] Phone: [phone]   Medical Equipment/Items Ordered: rolling walker                                                      Agency/Supplier:  [facility]  @ [phone]     Special Instructions:    My questions have been answered and I understand these instructions. I will adhere to these goals and the provided educational materials after my discharge from the hospital.  Patient/Caregiver Signature _______________________________ Date __________  Clinician Signature _______________________________________ Date __________  Please bring this form and your medication list with you to all your follow-up doctor's appointments.

## 2014-08-21 NOTE — Progress Notes (Signed)
Physical Therapy Session Note  Patient Details  Name: Jaime [name] MRN: [id number] Date of Birth: Jan 26, 1928  Today's Date: 08/21/2014 PT Individual Time: 0800-0900 PT Individual Time Calculation (min): 60 min   Short Term Goals: Week 1:  PT Short Term Goal 1 (Week 1): Pt will perform bed mobility to L and R with mod A PT Short Term Goal 1 - Progress (Week 1): Met PT Short Term Goal 2 (Week 1): Pt will perform stand pivot transfers to L and R with consistent mod A PT Short Term Goal 2 - Progress (Week 1): Met PT Short Term Goal 3 (Week 1): Pt will perform ambulation 25' with mod A, LRAD and begin trials of use of service dog PT Short Term Goal 3 - Progress (Week 1): Partly met PT Short Term Goal 4 (Week 1): Pt will negotiate up/down ramp with mod A and LRAD PT Short Term Goal 4 - Progress (Week 1): Met PT Short Term Goal 5 (Week 1): Pt will tolerate 5 min functional activities in standing for dynamic standing balance training PT Short Term Goal 5 - Progress (Week 1): Partly met Week 2:  PT Short Term Goal 1 (Week 2): = LTG for D/C 08/23/14  Skilled Therapeutic Interventions/Progress Updates:   Session focused on continuing family education with pt's husband present to provide assistance to patient as needed. Focused on functional transfers OOB and toilet transfers using RW, husband assisting pt with dressing and balance in standing during this activity and gait with RW with husband instructing pt in cues for direction (due to blindness) with S cues by therapist for positioning of husband during guarding. Pt and husband declined practicing car transfer again today.  Practiced stair negotiation for functional strengthening and mobility with therapist up/down 4 steps with bilateral rails with light min A and then progressed to [facility] in L hand and rail on R with min to mod A needed. Gait training with RW back to pt room with min A using RW and cues for directions and upright posture to work  on functional mobility and endurance.   Therapy Documentation Precautions:  Precautions Precautions: Fall Precaution Comments: Pt blind in both eyes; bilat prosthetic eyes  Restrictions Weight Bearing Restrictions: No  Pain:  No complaints of pain.  See FIM for current functional status  Therapy/Group: Individual Therapy  Canary Brim Ivory Broad, PT, DPT  08/21/2014, 9:40 AM
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
## 2014-08-22 ENCOUNTER — Inpatient Hospital Stay (HOSPITAL_COMMUNITY): Payer: BC Managed Care – PPO | Admitting: Physical Therapy

## 2014-08-22 ENCOUNTER — Inpatient Hospital Stay (HOSPITAL_COMMUNITY): Payer: BC Managed Care – PPO | Admitting: Occupational Therapy

## 2014-08-22 ENCOUNTER — Inpatient Hospital Stay (HOSPITAL_COMMUNITY): Payer: BC Managed Care – PPO | Admitting: Speech Pathology

## 2014-08-22 ENCOUNTER — Inpatient Hospital Stay (HOSPITAL_COMMUNITY): Payer: Medicare PPO | Admitting: Physical Therapy

## 2014-08-22 LAB — CBC
HEMATOCRIT: 30.8 % — AB (ref 36.0–46.0)
Hemoglobin: 9.7 g/dL — ABNORMAL LOW (ref 12.0–15.0)
MCH: 30.6 pg (ref 26.0–34.0)
MCHC: 31.5 g/dL (ref 30.0–36.0)
MCV: 97.2 fL (ref 78.0–100.0)
Platelets: 298 10*3/uL (ref 150–400)
RBC: 3.17 MIL/uL — AB (ref 3.87–5.11)
RDW: 22.5 % — ABNORMAL HIGH (ref 11.5–15.5)
WBC: 5.9 10*3/uL (ref 4.0–10.5)

## 2014-08-22 LAB — BASIC METABOLIC PANEL
Anion gap: 10 (ref 5–15)
BUN: 8 mg/dL (ref 6–20)
CALCIUM: 8.9 mg/dL (ref 8.9–10.3)
CHLORIDE: 108 mmol/L (ref 101–111)
CO2: 24 mmol/L (ref 22–32)
CREATININE: 0.54 mg/dL (ref 0.44–1.00)
GFR calc non Af Amer: >60 mL/min (ref >60)
Glucose, Bld: 106 mg/dL — ABNORMAL HIGH (ref 65–99)
Potassium: 3.9 mmol/L (ref 3.5–5.1)
Sodium: 142 mmol/L (ref 135–145)

## 2014-08-22 NOTE — Discharge Summary (Signed)
Discharge summary job # [id number]

## 2014-08-22 NOTE — Progress Notes (Signed)
Social Work Discharge Note  The overall goal for the admission was met for:   Discharge location: Yes - home with husband to provide 24/7 assistance  Length of Stay: Yes - 14 days  Discharge activity level: Yes - supervision/ min assist  Home/community participation: Yes  Services provided included: MD, RD, PT, OT, SLP, RN, TR, Pharmacy and SW  Financial Services: Medicare [facility])  Follow-up services arranged: Home Health: PT, OT via [place], DME: rolling walker via [employer] and Patient/Family has no preference for HH/DME agencies  Comments (or additional information):  Patient/Family verbalized understanding of follow-up arrangements: Yes  Individual responsible for coordination of the follow-up plan: pt/ husband  Confirmed correct DME delivered: Lennart Pall 08/22/2014    HOYLE, LUCY

## 2014-08-22 NOTE — Progress Notes (Signed)
Speech Language Pathology Discharge Summary  Patient Details  Name: Jaime [name] MRN: [id number] Date of Birth: 09-Nov-1927  Today's Date: 08/22/2014 SLP Individual Time: 0800-0900 SLP Individual Time Calculation (min): 60 min   Skilled Therapeutic Interventions:  Pt was seen for skilled ST targeting goals for dysphagia as well as for family education prior to discharge tomorrow.  Upon arrival, pt was reclined in bed, awake, alert, and agreeable to participate in [place].  SLP transferred pt to wheelchair to maximize attention, alertness, and swallowing safety during structured tasks.  Pt consumed presentations of regular textures and thin liquids via cup with supervision cues for use of swallowing precautions. Pt recalled recommendation for no straws from yesterday's MBS with min question cues.  No overt s/s of aspiration were noted with solids or liquids.  SLP provided skilled education regarding currently recommended swallowing precautions with pt's husband who was present for the duration of today's therapy session.  [name] verbalized understanding of rationale behind currently recommended swallowing precautions.  SLP also provided skilled education regarding compensatory strategies for memory and word finding to facilitate carryover of practiced skills in the home environment.  Pt continues to present with poor awareness of deficits; therefore, follow up ST services are recommended at next level of care.      Patient has met 7 of 8 long term goals.  Patient to discharge at overall Min;Supervision level.  Reasons goals not met:  Pt continues to require min assist to recognize and correct errors in the moment.    Clinical Impression/Discharge Summary:  Pt made functional gains while inpatient and is discharging having met 7 out of 8 long term goals.  Pt currently requires min assist during basic, functional cognitive tasks due to decreased intellectual/emergent awareness of deficits,  decreased recall of new information, and decreased functional problem solving.  Pt also presents with mild intermittent word finding deficits which worsen when pt becomes upset or is fatigued.  Pt is currently consuming regular textures and thin liquids with supervision cues for use of swallowing precautions, including no straws and small bites/sips.  Pt would benefit from skilled ST follow up at next level of care to address cognitive deficits.  SLP strongly recommends 24/7 supervision and assistance for medication and financial management.  Pt and pt's husband are aware and in agreement with recommendations.  Pt and family education is complete at this time.     Care Partner:  Caregiver Able to Provide Assistance: Yes  Type of Caregiver Assistance: Physical;Cognitive  Recommendation:  24 hour supervision/assistance;Outpatient SLP  Rationale for SLP Follow Up: Maximize functional communication;Maximize cognitive function and independence;Maximize swallowing safety;Reduce caregiver burden   Equipment: none recommended by SLP    Reasons for discharge: Discharged from hospital   Patient/Family Agrees with Progress Made and Goals Achieved: Yes   See FIM for current functional status  PageSelinda Orion 08/22/2014, 8:51 AM
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
## 2014-08-22 NOTE — Progress Notes (Signed)
Physical Therapy Discharge Summary  Patient Details  Name: Jaime [name] MRN: [id number] Date of Birth: 26-Jan-1928  Today's Date: 08/22/2014 PT Individual Time: 0900-1005 PT Individual Time Calculation (min): 65 min    Patient has met 12 of 12 long term goals due to improved activity tolerance, improved balance, increased strength, decreased pain, ability to compensate for deficits, improved attention and improved awareness.  Patient to discharge at an ambulatory level [place] with RW and will continue to progress to ambulating with [facility] and service dog with HHPT.   Patient's care partner is independent to provide the necessary physical, cognitive and supervision assistance at discharge.  Reasons goals not met: All goals met  Recommendation:  Patient will benefit from ongoing skilled PT services in home health setting to continue to advance safe functional mobility, address ongoing impairments in vertigo, impaired activity tolerance/endurance, impaired strength, balance, gait, and minimize fall risk.  Equipment: RW  Reasons for discharge: treatment goals met and discharge from hospital  Patient/family agrees with progress made and goals achieved: Yes  PT Discharge Pt and husband participated in physical therapy to continue training for home and for re-assessment of vestibular symptoms.  Pt performed sit > stand and dynamic standing to don clothing with supervision.  Ambulated to gym 150' with RW with min A just to guide RW in unfamiliar environment and intermittent verbal cues for increased step length; pt fatigued quickly.  Performed ramp negotiation x 2 with one UE support on rail and husband HHA in other (min A) for home entry/exit.  Also performed stair negotiation for LE strengthening and balance training.  Performed sit > supine on flat mat min A and performed re-assessment of vestibular symptoms.  Pt with significant c/o vertigo with roll test from L > supine <> R side  lasting less than 30 seconds consistent with R horizontal canal canalithiasis.  Pt treated with [place] roll and then retested.  During second roll test to R pt reported dizziness for 1-2 seconds and with supine >sit to L.  Recommending pt perform prolonged positioning tonight on L side; provided husband with handout on how to perform prolonged positioning.  No further questions or concerns.   Vital Signs Oxygen Therapy O2 Device: Not Delivered Pain Pain Assessment Pain Assessment: No/denies pain Pain Score: 0-No pain Pain Intervention(s): Medication (See eMAR) (tylenol given scheduled to manage chronic pain) Cognition Overall Cognitive Status: Impaired/Different from baseline Arousal/Alertness: Awake/alert Orientation Level: Oriented X4 Attention: Selective Selective Attention: Appears intact Memory: Impaired Memory Impairment: Decreased short term memory Decreased Short Term Memory: Verbal complex;Functional complex Awareness: Impaired Awareness Impairment: Emergent impairment;Intellectual impairment Problem Solving: Impaired Problem Solving Impairment: Verbal complex;Functional complex Executive Function: Self Monitoring;Reasoning;Decision Making Reasoning: Impaired Reasoning Impairment: Verbal complex;Functional complex Decision Making: Impaired Decision Making Impairment: Verbal complex;Functional complex Self Monitoring: Impaired Self Monitoring Impairment: Verbal complex;Functional complex Behaviors: Poor frustration tolerance Safety/Judgment: Impaired [employer] Scales of Cognitive Functioning: Purposeful/appropriate Sensation Sensation Light Touch: Impaired by gross assessment Stereognosis: Not tested Hot/Cold: Not tested Proprioception: Appears Intact Additional Comments: LLE hypersensitive to touch secondary to previous knee surgery and skin grafting Coordination Gross Motor Movements are Fluid and Coordinated: Not tested Fine Motor Movements are Fluid and  Coordinated: Not tested Motor  Motor Motor - Discharge Observations: Generalized weakness  Mobility Bed Mobility Bed Mobility: Supine to Sit;Sit to Supine Supine to Sit: 4: Min assist Sit to Supine: 4: Min assist Sit to Supine - Details (indicate cue type and reason): Supine <> sit on flat mat  to simulate bed at home; required min A to control trunk when lowering to supine but pt able to elevate LE onto and off bed; also required light min A to elevate trunk to upright sitting; continues to require contact guard during bed mobility due to continued dizziness with rolling Transfers Stand Pivot Transfers: 5: Supervision;With armrests Stand Pivot Transfer Details (indicate cue type and reason): Pt able to perform stand pivot transfers bed <> w/c with verbal cues for hand placement on arm rests and guidance and close supervision for safety Locomotion  Ambulation Ambulation: Yes Ambulation/Gait Assistance: 4: Min guard Ambulation Distance (Feet): 150 Feet Assistive device: Rolling walker Gait Gait: Yes Gait Pattern: Impaired Gait Pattern: Step-through pattern;Decreased step length - right;Decreased step length - left;Decreased stance time - right;Decreased stride length;Antalgic;Decreased trunk rotation;Narrow base of support Stairs / Additional Locomotion Stairs: Yes Stairs Assistance: 4: Min guard Stairs Assistance Details (indicate cue type and reason): Performed stair negotiation up/down 16 stairs with bilat rails for LE strengthening and balance with pt self selecting alternating sequence to ascend and step to for descending and min guard and verbal cues for safety and sequencing Stair Management Technique: Two rails;Alternating pattern;Step to pattern;Forwards Number of Stairs: 16 Height of Stairs: 3 Wheelchair Mobility Wheelchair Mobility: No Distance: 150  Trunk/Postural Assessment  Postural Control Postural Control: Deficits on evaluation (delayed balance reactions )   Balance Static Sitting Balance Static Sitting - Balance Support: Feet supported Static Sitting - Level of Assistance: 6: Modified independent (Device/Increase time) Dynamic Sitting Balance Dynamic Sitting - Balance Support: Feet supported Dynamic Sitting - Level of Assistance: 6: Modified independent (Device/Increase time) Static Standing Balance Static Standing - Balance Support: No upper extremity supported Static Standing - Level of Assistance: 5: Stand by assistance Dynamic Standing Balance Dynamic Standing - Balance Support: Right upper extremity supported;Left upper extremity supported Dynamic Standing - Level of Assistance: 4: Min assist Extremity Assessment  RLE Strength RLE Overall Strength: Deficits RLE Overall Strength Comments: 4/5 overall except hip flexion 3/5 LLE Strength LLE Overall Strength: Deficits LLE Overall Strength Comments: 4/5 overall except 3/5 hip flexion, hypersensitive to touch in lower leg  See FIM for current functional status  Raylene Everts [facility] 08/22/2014, 10:58 AM

## 2014-08-22 NOTE — Progress Notes (Signed)
Occupational Therapy Discharge Summary and OT Interventions  Patient Details  Name: Jaime [name] MRN: [id number] Date of Birth: [birth year]  Today's Date: 08/22/2014 OT Individual Time: 1100-1154 and 1500-1550 OT Individual Time Calculation (min): 54 min and 50 min   Patient has met 11 of 11 long term goals due to improved activity tolerance, improved balance, ability to compensate for deficits, improved attention, improved awareness and improved coordination.  Patient to discharge at [facility] Assist level.  Patient's care partner is independent to provide the necessary physical and cognitive assistance at discharge.    Reasons goals not met: all goals met  Recommendation:  Patient will benefit from ongoing skilled OT services in home health setting to continue to advance functional skills in the area of BADL.  Equipment: No equipment provided  Reasons for discharge: treatment goals met and discharge from hospital  Patient/family agrees with progress made and goals achieved: Yes   OT Intervention: Session 1: Upon entering the room, pt seated in wheelchair with no c/o pain. Husband, Rush Landmark, Present for ongoing family training/education. Pt and family having questions regarding upcoming discharge as well as home health OT recommendations. OT educated pt on these subjects with pt and caregiver asking questions as appropriate. OT reviewed information from last session regarding energy conservation in order to provide education to husband as well on this topic.Pt was able to recall 2/6 concepts discussed from yesterdays OT session. Pt ambulated to bathroom with RW and min guard assist. Toileting as well as transfer with supervision this session. Pt having successful BM. OT propelled wheelchair to day room as pt reports having carpet at home. Pt ambulating on carpet and demonstrating transfer onto bed with assist from husband with min A. Pt ambulating ~ 100 feet towards room with min guard  assist from husband and RW. Husband providing visual cues for environment for safety. Pt assisted via wheelchair the rest of the way to room secondary to fatigue. All needed items within reach and husband present in room.  Session 2: Pt transitioned easily from PT session with no c/o pain. Therapist assisted pt to room via wheelchair with total A secondary to energy conservation. Husband setting up wheelchair and assisting pt with ambulating into bathroom with RW for safety. Pt performed toileting tasks with supervision from husband. Pt having BM and performing clothing management and hygiene with supervision as well. Pt removing all clothing while seated on toilet in preparation of shower transfer. Husband assisted wife onto shower seat in walk in shower with min A and use of RW. Pt bathing at shower level with use of LH reacher to wash B feet and legs. Pt required steady assist for balance when standing to wash buttocks and peri area. Pt dressing in nightgown while seated on shower seat and husband assisted pt in ambulating to bed with RW and min guard assist. Sit >supine with min A and pt reports extreme fatigue. Husband remained present in room for patients needs.   OT Discharge Precautions/Restrictions  Restrictions Weight Bearing Restrictions: No Pain Pain Assessment Pain Assessment: No/denies pain Pain Score: 0-No pain Pain Intervention(s): Medication (See eMAR) (tylenol given scheduled to manage chronic pain) Vision/Perception  Vision- History Baseline Vision/History: Legally blind Vision- Assessment Eye Alignment: Impaired (comment)  Cognition Overall Cognitive Status: Impaired/Different from baseline Arousal/Alertness: Awake/alert Orientation Level: Oriented X4 Attention: Selective Selective Attention: Appears intact Memory: Impaired Memory Impairment: Decreased short term memory Decreased Short Term Memory: Verbal complex;Functional complex Awareness: Impaired Awareness  Impairment: Emergent impairment;Intellectual impairment Problem  Solving: Impaired Problem Solving Impairment: Verbal complex;Functional complex Executive Function: Self Monitoring;Reasoning;Decision Making Reasoning: Impaired Reasoning Impairment: Verbal complex;Functional complex Decision Making: Impaired Decision Making Impairment: Verbal complex;Functional complex Self Monitoring: Impaired Self Monitoring Impairment: Verbal complex;Functional complex Behaviors: Poor frustration tolerance Safety/Judgment: Impaired [employer] Scales of Cognitive Functioning: Purposeful/appropriate Sensation Sensation Light Touch: Impaired by gross assessment Stereognosis: Not tested Hot/Cold: Not tested Proprioception: Appears Intact Additional Comments: LLE hypersensitive to touch secondary to previous knee surgery and skin grafting Coordination Gross Motor Movements are Fluid and Coordinated: Not tested Fine Motor Movements are Fluid and Coordinated: Not tested Motor  Motor Motor - Discharge Observations: Generalized weakness Mobility  Bed Mobility Bed Mobility: Supine to Sit;Sit to Supine Supine to Sit: 4: Min assist Sit to Supine: 4: Min assist Sit to Supine - Details (indicate cue type and reason): Supine <> sit on flat mat to simulate bed at home; required min A to control trunk when lowering to supine but pt able to elevate LE onto and off bed; also required light min A to elevate trunk to upright sitting; continues to require contact guard during bed mobility due to continued dizziness with rolling  Trunk/Postural Assessment  Postural Control Postural Control: Deficits on evaluation (delayed balance reactions )  Balance Static Sitting Balance Static Sitting - Balance Support: Feet supported Static Sitting - Level of Assistance: 6: Modified independent (Device/Increase time) Dynamic Sitting Balance Dynamic Sitting - Balance Support: Feet supported Dynamic Sitting - Level of  Assistance: 6: Modified independent (Device/Increase time) Static Standing Balance Static Standing - Balance Support: No upper extremity supported Static Standing - Level of Assistance: 5: Stand by assistance Dynamic Standing Balance Dynamic Standing - Balance Support: Right upper extremity supported;Left upper extremity supported Dynamic Standing - Level of Assistance: 4: Min assist Extremity/Trunk Assessment RUE Assessment RUE Assessment: Exceptions to [facility] RUE Strength RUE Overall Strength: Deficits (3+/5 overall ) LUE Assessment LUE Assessment: Exceptions to [facility] LUE Strength LUE Overall Strength Comments: 4/5 strength  See FIM for current functional status  Phineas Semen 08/22/2014, 12:15 PM

## 2014-08-22 NOTE — Plan of Care (Signed)
Problem: RH Awareness Goal: LTG: Patient will demonstrate intellectual/emergent (SLP) LTG: Patient will demonstrate intellectual/emergent/anticipatory awareness with assist during a cognitive/linguistic activity (SLP)  Outcome: Not Met (add Reason) Pt needs min assist to recognize and correct errors in the moment
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
## 2014-08-23 DIAGNOSIS — G8929 Other chronic pain: Secondary | ICD-10-CM

## 2014-08-23 MED ORDER — GUAIFENESIN ER 600 MG PO TB12: 600.0000 mg | ORAL_TABLET | Freq: Two times a day (BID) | ORAL | Status: DC

## 2014-08-23 MED ORDER — OMEPRAZOLE 20 MG PO CPDR: 20.0000 mg | DELAYED_RELEASE_CAPSULE | Freq: Every day | ORAL | Status: AC

## 2014-08-23 MED ORDER — LEVETIRACETAM 1000 MG PO TABS: 1000.0000 mg | ORAL_TABLET | Freq: Two times a day (BID) | ORAL | Status: DC

## 2014-08-23 MED ORDER — OXYCODONE-ACETAMINOPHEN 5-325 MG PO TABS: 1.0000 | ORAL_TABLET | Freq: Four times a day (QID) | ORAL | Status: DC | PRN

## 2014-08-23 NOTE — Progress Notes (Signed)
Pt discharged via wheelchair to home with husband and service dog. Pt alert and oriented x4. No c/o pain. No respiratory distress. Education complete and discharge instructions given by PA. Care plans resolved. No issues at this time. Jillyn HiddenStone,Rodolfo Gaster R, RN
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
## 2014-09-02 ENCOUNTER — Telehealth: Payer: Self-pay | Admitting: *Deleted

## 2014-09-02 NOTE — Telephone Encounter (Signed)
[name] called to let us know that Jaime [name] is refusing OT services.

## 2014-09-03 NOTE — Telephone Encounter (Signed)
Honestly, not surprised.

## 2014-09-12 ENCOUNTER — Telehealth: Payer: Self-pay | Admitting: *Deleted
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
## 2014-09-13 NOTE — Telephone Encounter (Signed)
Called pt and passed on Dr. Rosalyn ChartersSwartz's message that it is okay to wash and use hair dye

## 2014-09-13 NOTE — Telephone Encounter (Signed)
Yes, it's ok to wash and use hair coloring

## 2014-09-17 ENCOUNTER — Encounter: Payer: Self-pay | Admitting: Oncology

## 2014-09-17 ENCOUNTER — Inpatient Hospital Stay: Payer: Medicare PPO

## 2014-09-17 ENCOUNTER — Inpatient Hospital Stay: Payer: Medicare PPO | Attending: Oncology | Admitting: Oncology

## 2014-09-17 VITALS — BP 152/73 | HR 81 | Temp 97.5°F | Resp 20

## 2014-09-17 DIAGNOSIS — M129 Arthropathy, unspecified: Secondary | ICD-10-CM | POA: Diagnosis not present

## 2014-09-17 DIAGNOSIS — Z9181 History of falling: Secondary | ICD-10-CM | POA: Insufficient documentation

## 2014-09-17 DIAGNOSIS — H540 Blindness, both eyes: Secondary | ICD-10-CM | POA: Diagnosis not present

## 2014-09-17 DIAGNOSIS — M5126 Other intervertebral disc displacement, lumbar region: Secondary | ICD-10-CM | POA: Diagnosis not present

## 2014-09-17 DIAGNOSIS — R7989 Other specified abnormal findings of blood chemistry: Secondary | ICD-10-CM | POA: Insufficient documentation

## 2014-09-17 DIAGNOSIS — Z79899 Other long term (current) drug therapy: Secondary | ICD-10-CM | POA: Insufficient documentation

## 2014-09-17 DIAGNOSIS — K219 Gastro-esophageal reflux disease without esophagitis: Secondary | ICD-10-CM

## 2014-09-17 DIAGNOSIS — R799 Abnormal finding of blood chemistry, unspecified: Secondary | ICD-10-CM

## 2014-09-17 DIAGNOSIS — Z8781 Personal history of (healed) traumatic fracture: Secondary | ICD-10-CM | POA: Insufficient documentation

## 2014-09-17 DIAGNOSIS — D649 Anemia, unspecified: Secondary | ICD-10-CM | POA: Insufficient documentation

## 2014-09-17 DIAGNOSIS — K449 Diaphragmatic hernia without obstruction or gangrene: Secondary | ICD-10-CM

## 2014-09-17 DIAGNOSIS — Z8614 Personal history of Methicillin resistant Staphylococcus aureus infection: Secondary | ICD-10-CM | POA: Insufficient documentation

## 2014-09-17 LAB — CBC WITH DIFFERENTIAL/PLATELET
Basophils Absolute: 0.1 10*3/uL (ref 0–0.1)
Basophils Relative: 1 %
EOS ABS: 0.1 10*3/uL (ref 0–0.7)
Eosinophils Relative: 1 %
HEMATOCRIT: 30.1 % — AB (ref 35.0–47.0)
Hemoglobin: 9.5 g/dL — ABNORMAL LOW (ref 12.0–16.0)
LYMPHS PCT: 24 %
Lymphs Abs: 2.3 10*3/uL (ref 1.0–3.6)
MCH: 31.9 pg (ref 26.0–34.0)
MCHC: 31.4 g/dL — ABNORMAL LOW (ref 32.0–36.0)
MCV: 101.7 fL — AB (ref 80.0–100.0)
Monocytes Absolute: 0.6 10*3/uL (ref 0.2–0.9)
Monocytes Relative: 7 %
Neutro Abs: 6.5 10*3/uL (ref 1.4–6.5)
Neutrophils Relative %: 67 %
PLATELETS: 254 10*3/uL (ref 150–440)
RBC: 2.96 MIL/uL — ABNORMAL LOW (ref 3.80–5.20)
RDW: 26.1 % — ABNORMAL HIGH (ref 11.5–14.5)
WBC: 9.6 10*3/uL (ref 3.6–11.0)

## 2014-09-19 LAB — MISC LABCORP TEST (SEND OUT)

## 2014-10-04 ENCOUNTER — Telehealth: Payer: Self-pay | Admitting: *Deleted

## 2014-10-04 NOTE — Telephone Encounter (Signed)
Pt wants to cancel her hospital f/u appt with Dr. Riley Kill on tuesday June 28th.  I called her back and asked why and she stated she did not feel it was necessary.   Are you okay with this?Marland Kitchen..please advise

## 2014-10-07 NOTE — Telephone Encounter (Signed)
That is fine. No prescriptions or questions will be fielded by this office moving forward as she cancelled the day before her appt

## 2014-10-08 ENCOUNTER — Encounter: Payer: Medicare PPO | Admitting: Physical Medicine & Rehabilitation
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
## 2014-10-29 ENCOUNTER — Inpatient Hospital Stay: Payer: Medicare PPO | Admitting: Oncology

## 2014-10-29 ENCOUNTER — Inpatient Hospital Stay: Payer: Medicare PPO

## 2014-10-29 ENCOUNTER — Inpatient Hospital Stay: Payer: Medicare PPO | Attending: Oncology | Admitting: Oncology

## 2014-10-29 DIAGNOSIS — R7989 Other specified abnormal findings of blood chemistry: Secondary | ICD-10-CM | POA: Diagnosis not present

## 2014-10-29 DIAGNOSIS — Z8614 Personal history of Methicillin resistant Staphylococcus aureus infection: Secondary | ICD-10-CM | POA: Diagnosis not present

## 2014-10-29 DIAGNOSIS — H540 Blindness, both eyes: Secondary | ICD-10-CM | POA: Insufficient documentation

## 2014-10-29 DIAGNOSIS — D649 Anemia, unspecified: Secondary | ICD-10-CM

## 2014-10-29 DIAGNOSIS — Z8781 Personal history of (healed) traumatic fracture: Secondary | ICD-10-CM | POA: Diagnosis not present

## 2014-10-29 DIAGNOSIS — M129 Arthropathy, unspecified: Secondary | ICD-10-CM | POA: Diagnosis not present

## 2014-10-29 DIAGNOSIS — K219 Gastro-esophageal reflux disease without esophagitis: Secondary | ICD-10-CM | POA: Diagnosis not present

## 2014-10-29 DIAGNOSIS — Z87891 Personal history of nicotine dependence: Secondary | ICD-10-CM | POA: Diagnosis not present

## 2014-10-29 DIAGNOSIS — K449 Diaphragmatic hernia without obstruction or gangrene: Secondary | ICD-10-CM | POA: Diagnosis not present

## 2014-10-29 LAB — CBC WITH DIFFERENTIAL/PLATELET
BASOS ABS: 0.1 10*3/uL (ref 0–0.1)
Basophils Relative: 1 %
Eosinophils Absolute: 0.1 10*3/uL (ref 0–0.7)
Eosinophils Relative: 1 %
HCT: 30.8 % — ABNORMAL LOW (ref 35.0–47.0)
Hemoglobin: 10.1 g/dL — ABNORMAL LOW (ref 12.0–16.0)
LYMPHS ABS: 3.1 10*3/uL (ref 1.0–3.6)
Lymphocytes Relative: 30 %
MCH: 33.1 pg (ref 26.0–34.0)
MCHC: 32.7 g/dL (ref 32.0–36.0)
MCV: 101.2 fL — ABNORMAL HIGH (ref 80.0–100.0)
MONO ABS: 0.8 10*3/uL (ref 0.2–0.9)
MONOS PCT: 8 %
NEUTROS PCT: 60 %
Neutro Abs: 6.3 10*3/uL (ref 1.4–6.5)
PLATELETS: 252 10*3/uL (ref 150–440)
RBC: 3.04 MIL/uL — ABNORMAL LOW (ref 3.80–5.20)
RDW: 24.7 % — ABNORMAL HIGH (ref 11.5–14.5)
WBC: 10.4 10*3/uL (ref 3.6–11.0)

## 2014-11-01 DIAGNOSIS — S065X0D Traumatic subdural hemorrhage without loss of consciousness, subsequent encounter: Secondary | ICD-10-CM | POA: Diagnosis not present

## 2014-11-01 DIAGNOSIS — W19XXXD Unspecified fall, subsequent encounter: Secondary | ICD-10-CM | POA: Diagnosis not present

## 2014-11-01 DIAGNOSIS — S058X1S Other injuries of right eye and orbit, sequela: Secondary | ICD-10-CM | POA: Diagnosis not present

## 2014-11-01 DIAGNOSIS — M6281 Muscle weakness (generalized): Secondary | ICD-10-CM | POA: Diagnosis not present

## 2014-11-01 DIAGNOSIS — S058X2S Other injuries of left eye and orbit, sequela: Secondary | ICD-10-CM
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
## 2015-05-19 ENCOUNTER — Encounter: Payer: Self-pay | Admitting: Emergency Medicine

## 2015-05-19 ENCOUNTER — Emergency Department: Payer: Medicare Other

## 2015-05-19 ENCOUNTER — Emergency Department
Admission: EM | Admit: 2015-05-19 | Discharge: 2015-05-19 | Disposition: A | Discharge status: Home / Self Care | Payer: Medicare Other | Attending: Emergency Medicine | Admitting: Emergency Medicine

## 2015-05-19 DIAGNOSIS — Y9389 Activity, other specified: Secondary | ICD-10-CM | POA: Insufficient documentation

## 2015-05-19 DIAGNOSIS — S065X0A Traumatic subdural hemorrhage without loss of consciousness, initial encounter: Secondary | ICD-10-CM | POA: Insufficient documentation

## 2015-05-19 DIAGNOSIS — Z79899 Other long term (current) drug therapy: Secondary | ICD-10-CM | POA: Insufficient documentation

## 2015-05-19 DIAGNOSIS — Y9289 Other specified places as the place of occurrence of the external cause: Secondary | ICD-10-CM | POA: Diagnosis not present

## 2015-05-19 DIAGNOSIS — S0990XA Unspecified injury of head, initial encounter: Secondary | ICD-10-CM | POA: Diagnosis present

## 2015-05-19 DIAGNOSIS — S065X9A Traumatic subdural hemorrhage with loss of consciousness of unspecified duration, initial encounter: Secondary | ICD-10-CM

## 2015-05-19 DIAGNOSIS — S8991XA Unspecified injury of right lower leg, initial encounter: Secondary | ICD-10-CM | POA: Diagnosis not present

## 2015-05-19 DIAGNOSIS — Y998 Other external cause status: Secondary | ICD-10-CM | POA: Diagnosis not present

## 2015-05-19 DIAGNOSIS — W01198A Fall on same level from slipping, tripping and stumbling with subsequent striking against other object, initial encounter: Secondary | ICD-10-CM | POA: Diagnosis not present

## 2015-05-19 DIAGNOSIS — M79604 Pain in right leg: Secondary | ICD-10-CM

## 2015-05-19 DIAGNOSIS — S065XAA Traumatic subdural hemorrhage with loss of consciousness status unknown, initial encounter: Secondary | ICD-10-CM

## 2015-05-19 DIAGNOSIS — Z87891 Personal history of nicotine dependence: Secondary | ICD-10-CM | POA: Diagnosis not present

## 2015-05-19 LAB — URINALYSIS COMPLETE WITH MICROSCOPIC (ARMC ONLY)
BACTERIA UA: NONE SEEN
Bilirubin Urine: NEGATIVE
Glucose, UA: NEGATIVE mg/dL
Hgb urine dipstick: NEGATIVE
KETONES UR: NEGATIVE mg/dL
Nitrite: NEGATIVE
PH: 5 (ref 5.0–8.0)
PROTEIN: NEGATIVE mg/dL
Specific Gravity, Urine: 1.015 (ref 1.005–1.030)

## 2015-05-19 LAB — BASIC METABOLIC PANEL
Anion gap: 8 (ref 5–15)
BUN: 17 mg/dL (ref 6–20)
CALCIUM: 9.4 mg/dL (ref 8.9–10.3)
CHLORIDE: 107 mmol/L (ref 101–111)
CO2: 25 mmol/L (ref 22–32)
Creatinine, Ser: 0.65 mg/dL (ref 0.44–1.00)
GFR calc Af Amer: >60 mL/min (ref >60)
GFR calc non Af Amer: >60 mL/min (ref >60)
Glucose, Bld: 133 mg/dL — ABNORMAL HIGH (ref 65–99)
Potassium: 4.3 mmol/L (ref 3.5–5.1)
Sodium: 140 mmol/L (ref 135–145)

## 2015-05-19 LAB — CBC
HEMATOCRIT: 31.3 % — AB (ref 35.0–47.0)
HEMOGLOBIN: 10.3 g/dL — AB (ref 12.0–16.0)
MCH: 32.8 pg (ref 26.0–34.0)
MCHC: 32.7 g/dL (ref 32.0–36.0)
MCV: 100.1 fL — AB (ref 80.0–100.0)
Platelets: 246 10*3/uL (ref 150–440)
RBC: 3.13 MIL/uL — AB (ref 3.80–5.20)
RDW: 24.4 % — ABNORMAL HIGH (ref 11.5–14.5)
WBC: 10.3 10*3/uL (ref 3.6–11.0)

## 2015-05-19 NOTE — ED Notes (Signed)
MD Stafford at bedside. 
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
## 2015-05-19 NOTE — ED Notes (Signed)
Pt with progressive weakness over past week.  Had seen dr Hyacinth Meeker last week and her labs were okay.  Now she even needs help when she uses the walker.  Fell on Saturday.

## 2015-05-19 NOTE — Discharge Instructions (Signed)
Musculoskeletal Pain Musculoskeletal pain is muscle and boney aches and pains. These pains can occur in any part of the body. Your caregiver may treat you without knowing the cause of the pain. They may treat you if blood or urine tests, X-rays, and other tests were normal.  CAUSES There is often not a definite cause or reason for these pains. These pains may be caused by a type of germ (virus). The discomfort may also come from overuse. Overuse includes working out too hard when your body is not fit. Boney aches also come from weather changes. Bone is sensitive to atmospheric pressure changes. HOME CARE INSTRUCTIONS   Ask when your test results will be ready. Make sure you get your test results.  Only take over-the-counter or prescription medicines for pain, discomfort, or fever as directed by your caregiver. If you were given medications for your condition, do not drive, operate machinery or power tools, or sign legal documents for 24 hours. Do not drink alcohol. Do not take sleeping pills or other medications that may interfere with treatment.  Continue all activities unless the activities cause more pain. When the pain lessens, slowly resume normal activities. Gradually increase the intensity and duration of the activities or exercise.  During periods of severe pain, bed rest may be helpful. Lay or sit in any position that is comfortable.  Putting ice on the injured area.  Put ice in a bag.  Place a towel between your skin and the bag.  Leave the ice on for 15 to 20 minutes, 3 to 4 times a day.  Follow up with your caregiver for continued problems and no reason can be found for the pain. If the pain becomes worse or does not go away, it may be necessary to repeat tests or do additional testing. Your caregiver may need to look further for a possible cause. SEEK IMMEDIATE MEDICAL CARE IF:  You have pain that is getting worse and is not relieved by medications.  You develop chest pain  that is associated with shortness or breath, sweating, feeling sick to your stomach (nauseous), or throw up (vomit).  Your pain becomes localized to the abdomen.  You develop any new symptoms that seem different or that concern you. MAKE SURE YOU:   Understand these instructions.  Will watch your condition.  Will get help right away if you are not doing well or get worse.   This information is not intended to replace advice given to you by your health care provider. Make sure you discuss any questions you have with your health care provider.   Document Released: 03/29/2005 Document Revised: 06/21/2011 Document Reviewed: 12/01/2012 Elsevier Interactive Patient Education 2016 [employer] Inc[employer]  Subdural Hematoma A subdural hematoma is a collection of blood between the brain and its tough outermost membrane covering (the dura). Blood clots that form in this area push down on the brain and cause irritation. A subdural hematoma may cause parts of the brain to stop working and eventually cause death.  CAUSES A subdural hematoma is caused by bleeding from a ruptured blood vessel (hemorrhage). The bleeding results from trauma to the head, such as from a fall or motor vehicle accident. There are two types of subdural hemorrhages:  Acute. This type develops shortly after a serious blow to the head and causes blood to collect very quickly. If not diagnosed and treated promptly, severe brain injury or death can occur.  Chronic. This is when bleeding develops more slowly, over weeks or months.  RISK FACTORS People at risk for subdural hematoma include older persons, infants, and alcoholics. SYMPTOMS An acute subdural hemorrhage develops over minutes to hours. Symptoms can include:  Temporary loss of consciousness.  Weakness of arms or legs on one side of the body.  Changes in vision or speech.  A severe headache.  Seizures.  Nausea and vomiting.  Increased sleepiness. A chronic subdural  hemorrhage develops over weeks to months. Symptoms may develop slowly and produce less noticeable problems or changes. Symptoms include:  A mild headache.  A change in personality.  Loss of balance or difficulty walking.  Weakness, numbness, or tingling in the arms or legs.  Nausea or vomiting.  Memory loss.  Double vision.  Increased sleepiness. DIAGNOSIS Your health care provider will perform a thorough physical and neurological exam. A CT scan or MRI may also be done. If there is blood on the scan, its color will help your health care provider determine how long the hemorrhage has been there. TREATMENT If the cause is an acute subdural hemorrhage, immediate treatment is needed. In many cases an emergency surgery is performed to drain accumulated blood or to remove the blood clot. Sometimes steroid or diuretic medicines or controlled breathing through a ventilator is needed to decrease pressure in the brain. This is especially true if there is any swelling of the brain. If the cause is a chronic subdural hemorrhage, treatment depends on a variety of factors. Sometimes no treatment is needed. If the subdural hematoma is small and causes minimal or no symptoms, you may be treated with bed rest, medicines, and observation. If the hemorrhage is large or if you have neurological symptoms, an emergency surgery is usually needed to remove the blood clot. People who develop a subdural hemorrhage are at risk of developing seizures, even after the subdural hematoma has been treated. You may be prescribed an anti-seizure (anticonvulsant) medicine for a year or longer. HOME CARE INSTRUCTIONS  Only take medicines as directed by your health care provider.  Rest if directed by your health care provider.  Keep all follow-up appointments with your health care provider.  If you play a contact sport such as football, hockey or soccer and you experienced a significant head injury, allow enough time for  healing (up to 15 days) before you start playing again. A repeated injury that occurs during this fragile repair period is likely to result in hemorrhage. This is called the second impact syndrome. SEEK IMMEDIATE MEDICAL CARE IF:  You fall or experience minor trauma to your head and you are taking blood thinners. If you are on any blood thinners even a very small injury can cause a subdural hematoma. You should not hesitate to seek medical attention regardless of how minor you think your symptoms are.  You experience a head injury and have:  Drowsiness or a decrease in alertness.  Confusion or forgetfulness.  Slurred speech.  Irrational or aggressive behavior.  Numbness or paralysis in any part of the body.  A feeling of being sick to your stomach (nauseous) or you throw up (vomit).  Difficulty walking or poor coordination.  Double vision.  Seizures.  A bleeding disorder.  A history of heavy alcohol use.  Clear fluid draining from your nose or ears.  Personality changes.  Difficulty thinking.  Worsening symptoms. MAKE SURE YOU:  Understand these instructions.  Will watch your condition.  Will get help right away if you are not doing well or get worse. FOR MORE INFORMATION [employer] of  Neurological Disorders and Stroke: [employer].uk American Association of Neurological Surgeons: www.neurosurgerytoday.org American Academy of Neurology (AAN): ComparePet.cz Brain Injury Association of America: www.biausa.org   This information is not intended to replace advice given to you by your health care provider. Make sure you discuss any questions you have with your health care provider.   Document Released: 02/14/2004 Document Revised: 01/17/2013 Document Reviewed: 09/29/2012 Elsevier Interactive Patient Education [employer]
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
## 2015-05-27 ENCOUNTER — Other Ambulatory Visit: Payer: Self-pay | Admitting: Otolaryngology

## 2015-05-27 ENCOUNTER — Ambulatory Visit
Admission: RE | Admit: 2015-05-27 | Discharge: 2015-05-27 | Disposition: A | Discharge status: Home / Self Care | Payer: Medicare Other | Source: Ambulatory Visit | Attending: Otolaryngology | Admitting: Otolaryngology

## 2015-05-27 DIAGNOSIS — Z8679 Personal history of other diseases of the circulatory system: Secondary | ICD-10-CM | POA: Insufficient documentation

## 2015-05-27 DIAGNOSIS — R519 Headache, unspecified: Secondary | ICD-10-CM

## 2015-05-27 DIAGNOSIS — R51 Headache: Principal | ICD-10-CM

## 2015-10-29 ENCOUNTER — Emergency Department: Payer: Medicare Other

## 2015-10-29 ENCOUNTER — Encounter: Payer: Self-pay | Admitting: Emergency Medicine

## 2015-10-29 ENCOUNTER — Emergency Department
Admission: EM | Admit: 2015-10-29 | Discharge: 2015-10-29 | Disposition: A | Discharge status: Home / Self Care | Payer: Medicare Other | Attending: Emergency Medicine | Admitting: Emergency Medicine

## 2015-10-29 DIAGNOSIS — R0602 Shortness of breath: Secondary | ICD-10-CM | POA: Diagnosis present

## 2015-10-29 DIAGNOSIS — Z79899 Other long term (current) drug therapy: Secondary | ICD-10-CM | POA: Insufficient documentation

## 2015-10-29 DIAGNOSIS — R42 Dizziness and giddiness: Secondary | ICD-10-CM | POA: Diagnosis not present

## 2015-10-29 DIAGNOSIS — R110 Nausea: Secondary | ICD-10-CM

## 2015-10-29 DIAGNOSIS — Z87891 Personal history of nicotine dependence: Secondary | ICD-10-CM | POA: Diagnosis not present

## 2015-10-29 DIAGNOSIS — R1013 Epigastric pain: Secondary | ICD-10-CM | POA: Diagnosis not present

## 2015-10-29 DIAGNOSIS — M179 Osteoarthritis of knee, unspecified: Secondary | ICD-10-CM | POA: Insufficient documentation

## 2015-10-29 LAB — URINE DRUG SCREEN, QUALITATIVE (ARMC ONLY)
Amphetamines, Ur Screen: NOT DETECTED
BENZODIAZEPINE, UR SCRN: NOT DETECTED
Barbiturates, Ur Screen: NOT DETECTED
Cannabinoid 50 Ng, Ur ~~LOC~~: NOT DETECTED
Cocaine Metabolite,Ur ~~LOC~~: NOT DETECTED
MDMA (ECSTASY) UR SCREEN: NOT DETECTED
METHADONE SCREEN, URINE: NOT DETECTED
Opiate, Ur Screen: NOT DETECTED
Phencyclidine (PCP) Ur S: NOT DETECTED
TRICYCLIC, UR SCREEN: NOT DETECTED

## 2015-10-29 LAB — CBC WITH DIFFERENTIAL/PLATELET
BASOS PCT: 1 %
Basophils Absolute: 0.1 10*3/uL (ref 0–0.1)
EOS ABS: 0.1 10*3/uL (ref 0–0.7)
EOS PCT: 1 %
HCT: 31.4 % — ABNORMAL LOW (ref 35.0–47.0)
HEMOGLOBIN: 10.5 g/dL — AB (ref 12.0–16.0)
LYMPHS PCT: 22 %
Lymphs Abs: 2.3 10*3/uL (ref 1.0–3.6)
MCH: 33.8 pg (ref 26.0–34.0)
MCHC: 33.3 g/dL (ref 32.0–36.0)
MCV: 101.5 fL — ABNORMAL HIGH (ref 80.0–100.0)
Monocytes Absolute: 0.8 10*3/uL (ref 0.2–0.9)
Monocytes Relative: 7 %
NEUTROS ABS: 7.1 10*3/uL — AB (ref 1.4–6.5)
NEUTROS PCT: 69 %
Platelets: 208 10*3/uL (ref 150–440)
RBC: 3.1 MIL/uL — ABNORMAL LOW (ref 3.80–5.20)
RDW: 24.2 % — AB (ref 11.5–14.5)
WBC: 10.3 10*3/uL (ref 3.6–11.0)

## 2015-10-29 LAB — COMPREHENSIVE METABOLIC PANEL
ALBUMIN: 4.1 g/dL (ref 3.5–5.0)
ALK PHOS: 43 U/L (ref 38–126)
ALT: 10 U/L — ABNORMAL LOW (ref 14–54)
ANION GAP: 6 (ref 5–15)
AST: 13 U/L — ABNORMAL LOW (ref 15–41)
BUN: 18 mg/dL (ref 6–20)
CALCIUM: 9.1 mg/dL (ref 8.9–10.3)
CO2: 26 mmol/L (ref 22–32)
Chloride: 109 mmol/L (ref 101–111)
Creatinine, Ser: 0.62 mg/dL (ref 0.44–1.00)
GFR calc non Af Amer: >60 mL/min (ref >60)
GLUCOSE: 103 mg/dL — AB (ref 65–99)
POTASSIUM: 3.9 mmol/L (ref 3.5–5.1)
SODIUM: 141 mmol/L (ref 135–145)
TOTAL PROTEIN: 7 g/dL (ref 6.5–8.1)
Total Bilirubin: 0.8 mg/dL (ref 0.3–1.2)

## 2015-10-29 LAB — URINALYSIS COMPLETE WITH MICROSCOPIC (ARMC ONLY)
Bacteria, UA: NONE SEEN
Bilirubin Urine: NEGATIVE
Glucose, UA: NEGATIVE mg/dL
HGB URINE DIPSTICK: NEGATIVE
KETONES UR: NEGATIVE mg/dL
LEUKOCYTES UA: NEGATIVE
NITRITE: NEGATIVE
PROTEIN: NEGATIVE mg/dL
RBC / HPF: NONE SEEN RBC/hpf (ref 0–5)
SPECIFIC GRAVITY, URINE: 1.005 (ref 1.005–1.030)
pH: 6 (ref 5.0–8.0)

## 2015-10-29 LAB — LIPASE, BLOOD: Lipase: 18 U/L (ref 11–51)

## 2015-10-29 LAB — TROPONIN I

## 2015-10-29 LAB — LACTIC ACID, PLASMA: LACTIC ACID, VENOUS: 0.8 mmol/L (ref 0.5–1.9)

## 2015-10-29 MED ORDER — DIATRIZOATE MEGLUMINE & SODIUM 66-10 % PO SOLN
15.0000 mL | Freq: Once | ORAL | Status: AC
  Administered 2015-10-29: 15 mL via ORAL

## 2015-10-29 MED ORDER — SODIUM CHLORIDE 0.9 % IV BOLUS (SEPSIS)
1000.0000 mL | Freq: Once | INTRAVENOUS | Status: AC
Start: 2015-10-29 — End: 2015-10-29
  Administered 2015-10-29: 1000 mL via INTRAVENOUS

## 2015-10-29 MED ORDER — ONDANSETRON HCL 4 MG PO TABS: 4.0000 mg | ORAL_TABLET | Freq: Three times a day (TID) | ORAL | Status: AC | PRN

## 2015-10-29 MED ORDER — FAMOTIDINE IN NACL 20-0.9 MG/50ML-% IV SOLN
20.0000 mg | Freq: Once | INTRAVENOUS | Status: AC
  Administered 2015-10-29: 20 mg via INTRAVENOUS
  Filled 2015-10-29: qty 50

## 2015-10-29 MED ORDER — IOPAMIDOL (ISOVUE-300) INJECTION 61%
100.0000 mL | Freq: Once | INTRAVENOUS | Status: AC | PRN
  Administered 2015-10-29: 100 mL via INTRAVENOUS

## 2015-10-29 MED ORDER — ONDANSETRON HCL 4 MG/2ML IJ SOLN
4.0000 mg | Freq: Once | INTRAMUSCULAR | Status: AC
  Administered 2015-10-29: 4 mg via INTRAVENOUS
  Filled 2015-10-29: qty 2

## 2015-10-29 NOTE — Discharge Instructions (Signed)

## 2015-10-29 NOTE — ED Notes (Signed)
MD at bedside. 
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
## 2015-10-31 LAB — URINE CULTURE

## 2015-11-28 ENCOUNTER — Other Ambulatory Visit: Payer: Self-pay | Admitting: Physician Assistant

## 2015-11-28 ENCOUNTER — Ambulatory Visit
Admission: RE | Admit: 2015-11-28 | Discharge: 2015-11-28 | Disposition: A | Discharge status: Home / Self Care | Payer: Medicare Other | Source: Ambulatory Visit | Attending: Physician Assistant | Admitting: Physician Assistant

## 2015-11-28 DIAGNOSIS — R109 Unspecified abdominal pain: Secondary | ICD-10-CM

## 2015-11-28 DIAGNOSIS — R1011 Right upper quadrant pain: Secondary | ICD-10-CM

## 2015-11-28 DIAGNOSIS — K5730 Diverticulosis of large intestine without perforation or abscess without bleeding: Secondary | ICD-10-CM | POA: Insufficient documentation

## 2015-11-28 DIAGNOSIS — D71 Functional disorders of polymorphonuclear neutrophils: Secondary | ICD-10-CM | POA: Diagnosis not present

## 2015-11-28 DIAGNOSIS — K449 Diaphragmatic hernia without obstruction or gangrene: Secondary | ICD-10-CM | POA: Insufficient documentation

## 2015-11-28 DIAGNOSIS — N281 Cyst of kidney, acquired: Secondary | ICD-10-CM | POA: Diagnosis not present

## 2015-11-28 DIAGNOSIS — M419 Scoliosis, unspecified: Secondary | ICD-10-CM | POA: Insufficient documentation

## 2015-11-28 MED ORDER — IOPAMIDOL (ISOVUE-300) INJECTION 61%
100.0000 mL | Freq: Once | INTRAVENOUS | Status: AC | PRN
  Administered 2015-11-28: 100 mL via INTRAVENOUS

## 2016-10-28 ENCOUNTER — Ambulatory Visit: Payer: Medicare Other | Attending: Otolaryngology | Admitting: Speech Pathology

## 2016-10-28 DIAGNOSIS — R490 Dysphonia: Secondary | ICD-10-CM

## 2016-10-29 ENCOUNTER — Encounter: Payer: Self-pay | Admitting: Speech Pathology
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
## 2016-11-02 ENCOUNTER — Ambulatory Visit: Payer: Medicare Other | Admitting: Speech Pathology

## 2016-11-04 ENCOUNTER — Encounter: Payer: Self-pay | Admitting: Speech Pathology

## 2016-11-04 ENCOUNTER — Ambulatory Visit: Payer: Medicare Other | Admitting: Speech Pathology

## 2016-11-04 DIAGNOSIS — R490 Dysphonia: Secondary | ICD-10-CM
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
## 2016-11-09 ENCOUNTER — Ambulatory Visit: Payer: Medicare Other | Admitting: Speech Pathology

## 2016-11-09 ENCOUNTER — Encounter: Payer: Self-pay | Admitting: Speech Pathology

## 2016-11-09 DIAGNOSIS — R490 Dysphonia: Secondary | ICD-10-CM
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
## 2016-11-11 ENCOUNTER — Ambulatory Visit: Payer: Medicare Other | Attending: Otolaryngology | Admitting: Speech Pathology

## 2016-11-11 ENCOUNTER — Encounter: Payer: Self-pay | Admitting: Speech Pathology

## 2016-11-11 DIAGNOSIS — R490 Dysphonia: Secondary | ICD-10-CM | POA: Diagnosis present
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
## 2016-11-16 ENCOUNTER — Ambulatory Visit: Payer: Medicare Other | Admitting: Speech Pathology

## 2016-11-16 DIAGNOSIS — R490 Dysphonia: Secondary | ICD-10-CM | POA: Diagnosis not present

## 2016-11-17 ENCOUNTER — Encounter: Payer: Self-pay | Admitting: Speech Pathology
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
## 2016-11-18 ENCOUNTER — Encounter: Payer: Self-pay | Admitting: Speech Pathology

## 2016-11-18 ENCOUNTER — Ambulatory Visit: Payer: Medicare Other | Admitting: Speech Pathology

## 2016-11-18 DIAGNOSIS — R490 Dysphonia: Secondary | ICD-10-CM | POA: Diagnosis not present
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
## 2016-11-22 ENCOUNTER — Ambulatory Visit: Payer: Medicare Other | Admitting: Speech Pathology

## 2016-11-25 ENCOUNTER — Ambulatory Visit: Payer: Medicare Other | Admitting: Speech Pathology

## 2016-11-30 ENCOUNTER — Ambulatory Visit: Payer: Medicare Other | Admitting: Speech Pathology

## 2016-12-03 ENCOUNTER — Ambulatory Visit: Payer: Medicare Other | Admitting: Speech Pathology

## 2016-12-06 ENCOUNTER — Ambulatory Visit: Payer: Medicare Other | Admitting: Speech Pathology

## 2016-12-09 ENCOUNTER — Ambulatory Visit: Payer: Medicare Other | Admitting: Speech Pathology

## 2016-12-14 ENCOUNTER — Ambulatory Visit: Payer: Medicare Other | Admitting: Speech Pathology

## 2016-12-16 ENCOUNTER — Ambulatory Visit: Payer: Medicare Other | Admitting: Speech Pathology

## 2016-12-20 ENCOUNTER — Ambulatory Visit: Payer: Medicare Other | Admitting: Speech Pathology

## 2016-12-23 ENCOUNTER — Ambulatory Visit: Payer: Medicare Other | Admitting: Speech Pathology

## 2017-02-17 ENCOUNTER — Encounter: Payer: Self-pay | Admitting: Occupational Therapy

## 2017-02-17 ENCOUNTER — Ambulatory Visit: Payer: Medicare Other | Attending: Otolaryngology | Admitting: Occupational Therapy

## 2017-02-17 DIAGNOSIS — I890 Lymphedema, not elsewhere classified: Secondary | ICD-10-CM | POA: Insufficient documentation

## 2017-02-17 NOTE — Patient Instructions (Signed)

## 2017-02-21 ENCOUNTER — Encounter: Payer: Self-pay | Admitting: Occupational Therapy

## 2017-02-22 ENCOUNTER — Encounter (INDEPENDENT_AMBULATORY_CARE_PROVIDER_SITE_OTHER): Payer: Self-pay | Admitting: Vascular Surgery

## 2017-02-22 ENCOUNTER — Ambulatory Visit (INDEPENDENT_AMBULATORY_CARE_PROVIDER_SITE_OTHER): Payer: Medicare Other | Admitting: Vascular Surgery

## 2017-02-22 VITALS — BP 152/73 | HR 82 | Resp 18 | Ht 61.0 in

## 2017-02-22 DIAGNOSIS — M79605 Pain in left leg: Secondary | ICD-10-CM

## 2017-02-22 DIAGNOSIS — M79604 Pain in right leg: Secondary | ICD-10-CM | POA: Diagnosis not present

## 2017-02-22 DIAGNOSIS — R600 Localized edema: Secondary | ICD-10-CM
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
## 2017-03-30 ENCOUNTER — Encounter (INDEPENDENT_AMBULATORY_CARE_PROVIDER_SITE_OTHER): Payer: Self-pay | Admitting: Vascular Surgery

## 2017-03-30 ENCOUNTER — Ambulatory Visit (INDEPENDENT_AMBULATORY_CARE_PROVIDER_SITE_OTHER): Payer: Medicare Other | Admitting: Vascular Surgery

## 2017-03-30 ENCOUNTER — Ambulatory Visit (INDEPENDENT_AMBULATORY_CARE_PROVIDER_SITE_OTHER): Payer: Medicare Other

## 2017-03-30 VITALS — BP 159/76 | HR 76 | Resp 17

## 2017-03-30 DIAGNOSIS — M79604 Pain in right leg: Secondary | ICD-10-CM | POA: Diagnosis not present

## 2017-03-30 DIAGNOSIS — M79605 Pain in left leg: Secondary | ICD-10-CM

## 2017-03-30 DIAGNOSIS — I890 Lymphedema, not elsewhere classified: Secondary | ICD-10-CM | POA: Insufficient documentation

## 2017-03-30 DIAGNOSIS — R600 Localized edema: Secondary | ICD-10-CM | POA: Diagnosis not present

## 2017-03-30 DIAGNOSIS — I872 Venous insufficiency (chronic) (peripheral): Secondary | ICD-10-CM | POA: Diagnosis not present
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
## 2017-05-05 ENCOUNTER — Telehealth (INDEPENDENT_AMBULATORY_CARE_PROVIDER_SITE_OTHER): Payer: Self-pay | Admitting: Vascular Surgery

## 2017-05-05 NOTE — Telephone Encounter (Signed)
I inform the patient that her lymph pump referral will be resent to [employer] and I also gave the patient the number to the company [phone]

## 2017-05-05 NOTE — Telephone Encounter (Signed)
CALLING TO CHECK THE STATUS OF HER LYMMPH PUMP.

## 2017-05-26 ENCOUNTER — Telehealth (INDEPENDENT_AMBULATORY_CARE_PROVIDER_SITE_OTHER): Payer: Self-pay

## 2017-05-26 NOTE — Telephone Encounter (Signed)
A representative from [employer] called to state the patient has been denied the lymph pump for treatment of lymphedema.

## 2017-06-03 ENCOUNTER — Encounter: Payer: Self-pay | Admitting: Emergency Medicine

## 2017-06-03 ENCOUNTER — Observation Stay
Admission: EM | Admit: 2017-06-03 | Discharge: 2017-06-04 | Disposition: A | Discharge status: Home / Self Care | Payer: Medicare Other | Attending: Internal Medicine | Admitting: Internal Medicine

## 2017-06-03 ENCOUNTER — Other Ambulatory Visit: Payer: Self-pay

## 2017-06-03 DIAGNOSIS — I872 Venous insufficiency (chronic) (peripheral): Secondary | ICD-10-CM | POA: Insufficient documentation

## 2017-06-03 DIAGNOSIS — N390 Urinary tract infection, site not specified: Secondary | ICD-10-CM | POA: Insufficient documentation

## 2017-06-03 DIAGNOSIS — H543 Unqualified visual loss, both eyes: Secondary | ICD-10-CM | POA: Insufficient documentation

## 2017-06-03 DIAGNOSIS — F0391 Unspecified dementia with behavioral disturbance: Principal | ICD-10-CM | POA: Insufficient documentation

## 2017-06-03 DIAGNOSIS — Z8614 Personal history of Methicillin resistant Staphylococcus aureus infection: Secondary | ICD-10-CM | POA: Diagnosis not present

## 2017-06-03 DIAGNOSIS — K219 Gastro-esophageal reflux disease without esophagitis: Secondary | ICD-10-CM | POA: Insufficient documentation

## 2017-06-03 DIAGNOSIS — F419 Anxiety disorder, unspecified: Secondary | ICD-10-CM | POA: Insufficient documentation

## 2017-06-03 DIAGNOSIS — Z87820 Personal history of traumatic brain injury: Secondary | ICD-10-CM | POA: Diagnosis not present

## 2017-06-03 DIAGNOSIS — Z885 Allergy status to narcotic agent status: Secondary | ICD-10-CM | POA: Diagnosis not present

## 2017-06-03 DIAGNOSIS — Z79899 Other long term (current) drug therapy: Secondary | ICD-10-CM | POA: Diagnosis not present

## 2017-06-03 DIAGNOSIS — F22 Delusional disorders: Secondary | ICD-10-CM | POA: Diagnosis not present

## 2017-06-03 DIAGNOSIS — Z87891 Personal history of nicotine dependence: Secondary | ICD-10-CM | POA: Insufficient documentation

## 2017-06-03 DIAGNOSIS — D649 Anemia, unspecified: Secondary | ICD-10-CM | POA: Insufficient documentation

## 2017-06-03 DIAGNOSIS — Z880 Allergy status to penicillin: Secondary | ICD-10-CM | POA: Insufficient documentation

## 2017-06-03 DIAGNOSIS — I890 Lymphedema, not elsewhere classified: Secondary | ICD-10-CM | POA: Insufficient documentation

## 2017-06-03 DIAGNOSIS — Z881 Allergy status to other antibiotic agents status: Secondary | ICD-10-CM | POA: Insufficient documentation

## 2017-06-03 DIAGNOSIS — M1710 Unilateral primary osteoarthritis, unspecified knee: Secondary | ICD-10-CM | POA: Insufficient documentation

## 2017-06-03 DIAGNOSIS — Z96651 Presence of right artificial knee joint: Secondary | ICD-10-CM | POA: Diagnosis not present

## 2017-06-03 LAB — COMPREHENSIVE METABOLIC PANEL
ALT: 14 U/L (ref 14–54)
ANION GAP: 8 (ref 5–15)
AST: 18 U/L (ref 15–41)
Albumin: 4 g/dL (ref 3.5–5.0)
Alkaline Phosphatase: 45 U/L (ref 38–126)
BUN: 17 mg/dL (ref 6–20)
CO2: 24 mmol/L (ref 22–32)
Calcium: 9.2 mg/dL (ref 8.9–10.3)
Chloride: 109 mmol/L (ref 101–111)
Creatinine, Ser: 0.54 mg/dL (ref 0.44–1.00)
GFR calc Af Amer: >60 mL/min (ref >60)
GFR calc non Af Amer: >60 mL/min (ref >60)
Glucose, Bld: 104 mg/dL — ABNORMAL HIGH (ref 65–99)
POTASSIUM: 4 mmol/L (ref 3.5–5.1)
SODIUM: 141 mmol/L (ref 135–145)
TOTAL PROTEIN: 6.7 g/dL (ref 6.5–8.1)
Total Bilirubin: 0.8 mg/dL (ref 0.3–1.2)

## 2017-06-03 LAB — CBC WITH DIFFERENTIAL/PLATELET
BASOS ABS: 0.1 10*3/uL (ref 0–0.1)
Basophils Relative: 1 %
Eosinophils Absolute: 0.1 10*3/uL (ref 0–0.7)
Eosinophils Relative: 1 %
HEMATOCRIT: 28.8 % — AB (ref 35.0–47.0)
HEMOGLOBIN: 9.4 g/dL — AB (ref 12.0–16.0)
LYMPHS PCT: 24 %
Lymphs Abs: 2.2 10*3/uL (ref 1.0–3.6)
MCH: 31.8 pg (ref 26.0–34.0)
MCHC: 32.5 g/dL (ref 32.0–36.0)
MCV: 97.9 fL (ref 80.0–100.0)
MONO ABS: 0.7 10*3/uL (ref 0.2–0.9)
MONOS PCT: 7 %
NEUTROS ABS: 6.2 10*3/uL (ref 1.4–6.5)
NEUTROS PCT: 67 %
Platelets: 257 10*3/uL (ref 150–440)
RBC: 2.94 MIL/uL — ABNORMAL LOW (ref 3.80–5.20)
RDW: 28.2 % — AB (ref 11.5–14.5)
WBC: 9.3 10*3/uL (ref 3.6–11.0)

## 2017-06-03 LAB — URINALYSIS, COMPLETE (UACMP) WITH MICROSCOPIC
BACTERIA UA: NONE SEEN
BILIRUBIN URINE: NEGATIVE
Glucose, UA: NEGATIVE mg/dL
Hgb urine dipstick: NEGATIVE
KETONES UR: NEGATIVE mg/dL
Nitrite: NEGATIVE
PROTEIN: NEGATIVE mg/dL
SPECIFIC GRAVITY, URINE: 1.009 (ref 1.005–1.030)
pH: 8 (ref 5.0–8.0)

## 2017-06-03 LAB — URINE DRUG SCREEN, QUALITATIVE (ARMC ONLY)
Amphetamines, Ur Screen: NOT DETECTED
BARBITURATES, UR SCREEN: NOT DETECTED
BENZODIAZEPINE, UR SCRN: NOT DETECTED
COCAINE METABOLITE, UR ~~LOC~~: NOT DETECTED
Cannabinoid 50 Ng, Ur ~~LOC~~: NOT DETECTED
MDMA (Ecstasy)Ur Screen: NOT DETECTED
METHADONE SCREEN, URINE: NOT DETECTED
Opiate, Ur Screen: NOT DETECTED
Phencyclidine (PCP) Ur S: NOT DETECTED
TRICYCLIC, UR SCREEN: NOT DETECTED

## 2017-06-03 LAB — INFLUENZA PANEL BY PCR (TYPE A & B)
INFLAPCR: NEGATIVE
INFLBPCR: NEGATIVE

## 2017-06-03 LAB — PROTIME-INR
INR: 0.99
Prothrombin Time: 13 seconds (ref 11.4–15.2)

## 2017-06-03 LAB — APTT: aPTT: 30 seconds (ref 24–36)

## 2017-06-03 LAB — MRSA PCR SCREENING: MRSA by PCR: NEGATIVE

## 2017-06-03 MED ORDER — ALBUTEROL SULFATE (2.5 MG/3ML) 0.083% IN NEBU
3.0000 mL | INHALATION_SOLUTION | RESPIRATORY_TRACT | Status: DC | PRN
  Administered 2017-06-03: 19:00:00 3 mL via RESPIRATORY_TRACT
  Filled 2017-06-03: qty 3

## 2017-06-03 MED ORDER — ONDANSETRON HCL 4 MG PO TABS: 4.0000 mg | ORAL_TABLET | Freq: Four times a day (QID) | ORAL | Status: DC | PRN

## 2017-06-03 MED ORDER — VITAMIN B-12 1000 MCG PO TABS
1000.0000 ug | ORAL_TABLET | Freq: Every day | ORAL | Status: DC
  Administered 2017-06-03 – 2017-06-04 (×2): 1000 ug via ORAL
  Filled 2017-06-03 (×2): qty 1

## 2017-06-03 MED ORDER — ENOXAPARIN SODIUM 40 MG/0.4ML ~~LOC~~ SOLN
40.0000 mg | SUBCUTANEOUS | Status: DC
  Administered 2017-06-03: 40 mg via SUBCUTANEOUS
  Filled 2017-06-03: qty 0.4

## 2017-06-03 MED ORDER — RISPERIDONE 1 MG PO TABS
1.0000 mg | ORAL_TABLET | Freq: Every day | ORAL | Status: DC
  Administered 2017-06-03: 1 mg via ORAL
  Filled 2017-06-03: qty 1

## 2017-06-03 MED ORDER — FLUTICASONE PROPIONATE 50 MCG/ACT NA SUSP
1.0000 | Freq: Every day | NASAL | Status: DC
  Administered 2017-06-03 – 2017-06-04 (×2): 1 via NASAL
  Filled 2017-06-03: qty 16

## 2017-06-03 MED ORDER — RISPERIDONE 0.5 MG PO TBDP
0.5000 mg | ORAL_TABLET | Freq: Every day | ORAL | Status: DC
  Administered 2017-06-03: 21:00:00 0.5 mg via ORAL
  Filled 2017-06-03: qty 1

## 2017-06-03 MED ORDER — SODIUM CHLORIDE 0.9% FLUSH: 3.0000 mL | INTRAVENOUS | Status: DC | PRN

## 2017-06-03 MED ORDER — SODIUM CHLORIDE 0.9 % IV SOLN: 250.0000 mL | INTRAVENOUS | Status: DC | PRN

## 2017-06-03 MED ORDER — SENNA 8.6 MG PO TABS: 1.0000 | ORAL_TABLET | Freq: Every day | ORAL | Status: DC | PRN

## 2017-06-03 MED ORDER — PANTOPRAZOLE SODIUM 40 MG PO TBEC
40.0000 mg | DELAYED_RELEASE_TABLET | Freq: Every day | ORAL | Status: DC
Start: 2017-06-03 — End: 2017-06-04
  Administered 2017-06-03 – 2017-06-04 (×2): 40 mg via ORAL
  Filled 2017-06-03 (×2): qty 1

## 2017-06-03 MED ORDER — FERROUS SULFATE 325 (65 FE) MG PO TABS
325.0000 mg | ORAL_TABLET | Freq: Every day | ORAL | Status: DC
  Administered 2017-06-03 – 2017-06-04 (×2): 325 mg via ORAL
  Filled 2017-06-03 (×2): qty 1

## 2017-06-03 MED ORDER — ACETAMINOPHEN 650 MG RE SUPP: 650.0000 mg | Freq: Four times a day (QID) | RECTAL | Status: DC | PRN

## 2017-06-03 MED ORDER — ONDANSETRON HCL 4 MG/2ML IJ SOLN: 4.0000 mg | Freq: Four times a day (QID) | INTRAMUSCULAR | Status: DC | PRN

## 2017-06-03 MED ORDER — VITAMIN D 1000 UNITS PO TABS
1000.0000 [IU] | ORAL_TABLET | Freq: Every day | ORAL | Status: DC
  Administered 2017-06-03 – 2017-06-04 (×2): 1000 [IU] via ORAL
  Filled 2017-06-03 (×2): qty 1

## 2017-06-03 MED ORDER — ACETAMINOPHEN 325 MG PO TABS: 650.0000 mg | ORAL_TABLET | Freq: Four times a day (QID) | ORAL | Status: DC | PRN

## 2017-06-03 MED ORDER — MAGNESIUM OXIDE 400 (241.3 MG) MG PO TABS
400.0000 mg | ORAL_TABLET | Freq: Every day | ORAL | Status: DC
  Administered 2017-06-03 – 2017-06-04 (×2): 400 mg via ORAL
  Filled 2017-06-03 (×2): qty 1

## 2017-06-03 MED ORDER — SENNOSIDES-DOCUSATE SODIUM 8.6-50 MG PO TABS: 1.0000 | ORAL_TABLET | Freq: Every evening | ORAL | Status: DC | PRN

## 2017-06-03 MED ORDER — SODIUM CHLORIDE 0.9% FLUSH
3.0000 mL | Freq: Two times a day (BID) | INTRAVENOUS | Status: DC
  Administered 2017-06-03: 21:00:00 3 mL via INTRAVENOUS
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
## 2017-06-03 NOTE — ED Notes (Signed)
Dr. Lorinda Creedlaypacks is in speaking with patient at this time.
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
## 2017-06-04 LAB — BASIC METABOLIC PANEL
Anion gap: 7 (ref 5–15)
BUN: 13 mg/dL (ref 6–20)
CALCIUM: 9.3 mg/dL (ref 8.9–10.3)
CO2: 25 mmol/L (ref 22–32)
CREATININE: 0.71 mg/dL (ref 0.44–1.00)
Chloride: 110 mmol/L (ref 101–111)
Glucose, Bld: 109 mg/dL — ABNORMAL HIGH (ref 65–99)
Potassium: 3.9 mmol/L (ref 3.5–5.1)
Sodium: 142 mmol/L (ref 135–145)

## 2017-06-04 MED ORDER — RISPERIDONE 1 MG PO TABS: 1.0000 mg | ORAL_TABLET | Freq: Every day | ORAL | 0 refills | Status: DC

## 2017-06-04 MED ORDER — IPRATROPIUM-ALBUTEROL 0.5-2.5 (3) MG/3ML IN SOLN
3.0000 mL | Freq: Three times a day (TID) | RESPIRATORY_TRACT | Status: DC
  Administered 2017-06-04: 3 mL via RESPIRATORY_TRACT
  Filled 2017-06-04: qty 3

## 2017-06-04 NOTE — Discharge Instructions (Signed)
Resume diet and activity as before ° ° °

## 2017-06-04 NOTE — Progress Notes (Signed)
Discussed discharge instructions and medications with patient and her husband. IV removed. All questions addressed.  Made patient aware that she or her husband need to call to make follow up appointments on Monday since she is discharging on the weekend and we are unable to make her appointments.  Patient transported home via car by her husband.  Orson Apeanielle Dayshaun Whobrey, RN

## 2017-06-06 LAB — HEAVY METALS, BLOOD
Arsenic: 7 ug/L (ref 2–23)
Lead: 1 ug/dL (ref 0–4)
Mercury: NOT DETECTED ug/L (ref 0.0–14.9)

## 2017-06-06 LAB — ETHYLENE GLYCOL: Ethylene Glycol Lvl: NOT DETECTED mg/dL
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
## 2017-06-13 NOTE — ED Notes (Signed)
The patient called me for her lab results.  She was tested for heavy metals and ethylene glycol.  I told her that the tests are within normal, but that she needs to talk to dr Hyacinth Meekermiller about them.

## 2017-06-14 ENCOUNTER — Emergency Department (HOSPITAL_COMMUNITY)
Admission: EM | Admit: 2017-06-14 | Discharge: 2017-06-15 | Disposition: A | Discharge status: Home / Self Care | Payer: Medicare Other | Attending: Emergency Medicine | Admitting: Emergency Medicine

## 2017-06-14 ENCOUNTER — Encounter (HOSPITAL_COMMUNITY): Payer: Self-pay | Admitting: Emergency Medicine

## 2017-06-14 ENCOUNTER — Emergency Department (HOSPITAL_COMMUNITY): Payer: Medicare Other

## 2017-06-14 DIAGNOSIS — Z79899 Other long term (current) drug therapy: Secondary | ICD-10-CM | POA: Diagnosis not present

## 2017-06-14 DIAGNOSIS — Y9389 Activity, other specified: Secondary | ICD-10-CM | POA: Diagnosis not present

## 2017-06-14 DIAGNOSIS — Y999 Unspecified external cause status: Secondary | ICD-10-CM | POA: Diagnosis not present

## 2017-06-14 DIAGNOSIS — Z96651 Presence of right artificial knee joint: Secondary | ICD-10-CM | POA: Insufficient documentation

## 2017-06-14 DIAGNOSIS — S0003XA Contusion of scalp, initial encounter: Secondary | ICD-10-CM | POA: Diagnosis not present

## 2017-06-14 DIAGNOSIS — W010XXA Fall on same level from slipping, tripping and stumbling without subsequent striking against object, initial encounter: Secondary | ICD-10-CM | POA: Insufficient documentation

## 2017-06-14 DIAGNOSIS — Y929 Unspecified place or not applicable: Secondary | ICD-10-CM | POA: Insufficient documentation

## 2017-06-14 DIAGNOSIS — Z87891 Personal history of nicotine dependence: Secondary | ICD-10-CM | POA: Insufficient documentation

## 2017-06-14 DIAGNOSIS — W19XXXA Unspecified fall, initial encounter: Secondary | ICD-10-CM

## 2017-06-14 DIAGNOSIS — S0990XA Unspecified injury of head, initial encounter: Secondary | ICD-10-CM | POA: Diagnosis present

## 2017-06-14 HISTORY — DX: Lymphedema, not elsewhere classified: I89.0

## 2017-06-14 HISTORY — DX: Bronchitis, not specified as acute or chronic: J40

## 2017-06-14 LAB — COMPREHENSIVE METABOLIC PANEL
ALBUMIN: 3.7 g/dL (ref 3.5–5.0)
ALT: 10 U/L — ABNORMAL LOW (ref 14–54)
AST: 15 U/L (ref 15–41)
Alkaline Phosphatase: 47 U/L (ref 38–126)
Anion gap: 10 (ref 5–15)
BUN: 14 mg/dL (ref 6–20)
CO2: 24 mmol/L (ref 22–32)
Calcium: 9.1 mg/dL (ref 8.9–10.3)
Chloride: 108 mmol/L (ref 101–111)
Creatinine, Ser: 0.69 mg/dL (ref 0.44–1.00)
GFR calc non Af Amer: >60 mL/min (ref >60)
GLUCOSE: 111 mg/dL — AB (ref 65–99)
POTASSIUM: 4 mmol/L (ref 3.5–5.1)
SODIUM: 142 mmol/L (ref 135–145)
TOTAL PROTEIN: 6.7 g/dL (ref 6.5–8.1)
Total Bilirubin: 0.8 mg/dL (ref 0.3–1.2)

## 2017-06-14 LAB — CBC WITH DIFFERENTIAL/PLATELET
BASOS ABS: 0.1 10*3/uL (ref 0.0–0.1)
Basophils Relative: 1 %
EOS ABS: 0.2 10*3/uL (ref 0.0–0.7)
Eosinophils Relative: 2 %
HCT: 29.1 % — ABNORMAL LOW (ref 36.0–46.0)
Hemoglobin: 9.1 g/dL — ABNORMAL LOW (ref 12.0–15.0)
LYMPHS ABS: 2.8 10*3/uL (ref 0.7–4.0)
Lymphocytes Relative: 29 %
MCH: 31.3 pg (ref 26.0–34.0)
MCHC: 31.3 g/dL (ref 30.0–36.0)
MCV: 100 fL (ref 78.0–100.0)
MONO ABS: 0.6 10*3/uL (ref 0.1–1.0)
Monocytes Relative: 6 %
Neutro Abs: 6.1 10*3/uL (ref 1.7–7.7)
Neutrophils Relative %: 62 %
Platelets: 300 10*3/uL (ref 150–400)
RBC: 2.91 MIL/uL — ABNORMAL LOW (ref 3.87–5.11)
RDW: 26.5 % — AB (ref 11.5–15.5)
WBC: 9.8 10*3/uL (ref 4.0–10.5)

## 2017-06-14 LAB — URINALYSIS, ROUTINE W REFLEX MICROSCOPIC
Bilirubin Urine: NEGATIVE
Glucose, UA: NEGATIVE mg/dL
Hgb urine dipstick: NEGATIVE
Ketones, ur: NEGATIVE mg/dL
LEUKOCYTES UA: NEGATIVE
NITRITE: NEGATIVE
PH: 7 (ref 5.0–8.0)
PROTEIN: NEGATIVE mg/dL
Specific Gravity, Urine: 1.016 (ref 1.005–1.030)

## 2017-06-14 MED ORDER — ACETAMINOPHEN 325 MG PO TABS
650.0000 mg | ORAL_TABLET | Freq: Once | ORAL | Status: AC
  Administered 2017-06-14: 650 mg via ORAL
  Filled 2017-06-14: qty 2

## 2017-06-14 NOTE — ED Notes (Signed)
Patient transported to CT scan . 

## 2017-06-14 NOTE — ED Provider Notes (Signed)
Fall, hit head Repetitive questioning - also h/o dementia Husband at bedside who provides reliable history.    Pending imaging and reassessment. Patient is feeling well. No change in mentation, pain. No nausea. Husband at bedside  CT's are negative for acute finding. Patient/family updated. She can be discharged home per plan of previous treatment team.    Elpidio AnisUpstill, Jatinder Mcdonagh, PA-C 06/15/17 Camelia [id number]    Plunkett, Whitney, MD 06/15/17 2032
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
## 2017-06-14 NOTE — ED Triage Notes (Addendum)
Patient arrived with EMS from home , lost her balance and fell at home this evening  , hit her head against the wood floor , no LOC , repetitive questions / hematoma at left upper occiput. She can not recall the incident .

## 2017-06-15 NOTE — ED Notes (Signed)
EDP/PA notified on pt.'s hypertension .

## 2017-06-18 ENCOUNTER — Emergency Department (HOSPITAL_COMMUNITY)
Admission: EM | Admit: 2017-06-18 | Discharge: 2017-06-19 | Disposition: A | Discharge status: Home / Self Care | Payer: Medicare Other | Attending: Emergency Medicine | Admitting: Emergency Medicine

## 2017-06-18 ENCOUNTER — Other Ambulatory Visit: Payer: Self-pay

## 2017-06-18 ENCOUNTER — Encounter (HOSPITAL_COMMUNITY): Payer: Self-pay | Admitting: Emergency Medicine

## 2017-06-18 DIAGNOSIS — R51 Headache: Secondary | ICD-10-CM | POA: Diagnosis not present

## 2017-06-18 DIAGNOSIS — R42 Dizziness and giddiness: Secondary | ICD-10-CM | POA: Diagnosis not present

## 2017-06-18 DIAGNOSIS — Z79899 Other long term (current) drug therapy: Secondary | ICD-10-CM | POA: Diagnosis not present

## 2017-06-18 DIAGNOSIS — R110 Nausea: Secondary | ICD-10-CM | POA: Diagnosis not present

## 2017-06-18 DIAGNOSIS — Z87891 Personal history of nicotine dependence: Secondary | ICD-10-CM | POA: Diagnosis not present

## 2017-06-18 DIAGNOSIS — R5383 Other fatigue: Secondary | ICD-10-CM | POA: Diagnosis not present

## 2017-06-18 DIAGNOSIS — R531 Weakness: Secondary | ICD-10-CM | POA: Insufficient documentation

## 2017-06-18 LAB — CBC
HCT: 30.4 % — ABNORMAL LOW (ref 36.0–46.0)
HEMOGLOBIN: 9.5 g/dL — AB (ref 12.0–15.0)
MCH: 31.5 pg (ref 26.0–34.0)
MCHC: 31.3 g/dL (ref 30.0–36.0)
MCV: 100.7 fL — ABNORMAL HIGH (ref 78.0–100.0)
PLATELETS: 274 10*3/uL (ref 150–400)
RBC: 3.02 MIL/uL — AB (ref 3.87–5.11)
RDW: 26.3 % — ABNORMAL HIGH (ref 11.5–15.5)
WBC: 7.7 10*3/uL (ref 4.0–10.5)

## 2017-06-18 LAB — BASIC METABOLIC PANEL
ANION GAP: 9 (ref 5–15)
BUN: 15 mg/dL (ref 6–20)
CALCIUM: 9.1 mg/dL (ref 8.9–10.3)
CHLORIDE: 107 mmol/L (ref 101–111)
CO2: 24 mmol/L (ref 22–32)
Creatinine, Ser: 0.68 mg/dL (ref 0.44–1.00)
GFR calc non Af Amer: >60 mL/min (ref >60)
Glucose, Bld: 116 mg/dL — ABNORMAL HIGH (ref 65–99)
Potassium: 3.9 mmol/L (ref 3.5–5.1)
SODIUM: 140 mmol/L (ref 135–145)

## 2017-06-18 MED ORDER — SODIUM CHLORIDE 0.9 % IV BOLUS (SEPSIS)
1000.0000 mL | Freq: Once | INTRAVENOUS | Status: AC
  Administered 2017-06-18: 1000 mL via INTRAVENOUS

## 2017-06-18 NOTE — ED Notes (Signed)
Orthostatic VS  Lying: BP  170/72  HR 72 Sitting: BP  174/58  HR 78 Standing: BP  178/77  HR 80  * Patient was dizzy upon standing and unsteady (kept moving backwards); had to use my legs to steady patient.

## 2017-06-18 NOTE — ED Triage Notes (Signed)
The patient said she fell Tuesday and came here to be evaluated.  The patient said she has had weakness since March 1st and she fell on March 5th and hit the back of her head.  The patient was seen here and discharged to home.  She is here because the weakness has gotten worse and her head feels heavy. She rates her headache 7-8/10.
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
## 2017-06-18 NOTE — ED Provider Notes (Signed)
Patient presented to the ER with generalized weakness and dizziness.  Patient reports that she has been extremely weak, her legs will not hold her.  She has had falls because of this.  Face to face Exam: HEENT -slight contusion left parietal scalp Lungs - CTAB Heart - RRR, no M/R/G Abd - S/NT/ND Neuro - alert, oriented x3  Plan: Imaging to rule out stroke and posterior circulation abnormality, basic lab work to evaluate for her generalized weakness   Gilda CreasePollina, Shereese Bonnie J, MD 06/18/17 2351

## 2017-06-18 NOTE — ED Notes (Signed)
PT CALLED IN WAITING ROOM BUT NO ANSWER

## 2017-06-19 ENCOUNTER — Emergency Department (HOSPITAL_COMMUNITY): Payer: Medicare Other

## 2017-06-19 ENCOUNTER — Telehealth: Payer: Self-pay | Admitting: Surgery

## 2017-06-19 LAB — I-STAT TROPONIN, ED: Troponin i, poc: 0 ng/mL (ref 0.00–0.08)

## 2017-06-19 MED ORDER — ALBUTEROL SULFATE (2.5 MG/3ML) 0.083% IN NEBU
5.0000 mg | INHALATION_SOLUTION | Freq: Once | RESPIRATORY_TRACT | Status: AC
  Administered 2017-06-19: 5 mg via RESPIRATORY_TRACT
  Filled 2017-06-19: qty 6

## 2017-06-19 MED ORDER — IOPAMIDOL (ISOVUE-370) INJECTION 76%
INTRAVENOUS | Status: AC
  Administered 2017-06-19: 50 mL via INTRAVENOUS
  Filled 2017-06-19: qty 50

## 2017-06-19 NOTE — ED Notes (Signed)
Pt departed in NAD.  

## 2017-06-19 NOTE — ED Notes (Signed)
Patient returned from CT; audibly expiratory wheeze coming from upper airway. Moderately cleared with coughing. Provider notified.

## 2017-06-19 NOTE — ED Notes (Signed)
Patient was able to ambulate with assistance but was still mildly unsteady. Patient's husband states that she has a cane at home. Recommended a walker to both patient and her husband for better stability.

## 2017-06-19 NOTE — Telephone Encounter (Signed)
ED CM received consult via CM Pool concerning HH recommendations. CM attempted to contact patient spoke with familymember who states, patient is unable to come to the phone at this time, he asked if  I could call back later. CM has agreed to contact patient later this afternoon.

## 2017-06-19 NOTE — Discharge Instructions (Signed)
1. Medications: usual home medications 2. Treatment: rest, drink plenty of fluids,  3. Follow Up: Please followup with your primary doctor in 1-2 days for discussion of your diagnoses and further evaluation after today's visit; if you do not have a primary care doctor use the resource guide provided to find one; Please return to the ER for falls, worsening symptoms, vomiting, fevers, numbness or other concerns

## 2017-06-20 LAB — CBG MONITORING, ED: GLUCOSE-CAPILLARY: 117 mg/dL — AB (ref 65–99)

## 2017-06-28 ENCOUNTER — Ambulatory Visit (INDEPENDENT_AMBULATORY_CARE_PROVIDER_SITE_OTHER): Payer: Medicare Other | Admitting: Vascular Surgery

## 2017-07-25 ENCOUNTER — Encounter (INDEPENDENT_AMBULATORY_CARE_PROVIDER_SITE_OTHER): Payer: Self-pay | Admitting: Vascular Surgery

## 2017-07-25 ENCOUNTER — Ambulatory Visit (INDEPENDENT_AMBULATORY_CARE_PROVIDER_SITE_OTHER): Payer: Medicare Other | Admitting: Vascular Surgery

## 2017-07-25 VITALS — BP 146/72 | HR 83 | Resp 16 | Ht 61.5 in | Wt 190.0 lb

## 2017-07-25 DIAGNOSIS — I890 Lymphedema, not elsewhere classified: Secondary | ICD-10-CM

## 2017-07-25 DIAGNOSIS — I872 Venous insufficiency (chronic) (peripheral): Secondary | ICD-10-CM | POA: Diagnosis not present
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
## 2017-08-04 ENCOUNTER — Encounter: Payer: Self-pay | Admitting: Family Medicine

## 2017-08-04 ENCOUNTER — Ambulatory Visit (INDEPENDENT_AMBULATORY_CARE_PROVIDER_SITE_OTHER): Payer: Medicare Other | Admitting: Family Medicine

## 2017-08-04 VITALS — BP 140/70 | HR 98 | Temp 97.5°F | Resp 16 | Ht 61.5 in | Wt 203.0 lb

## 2017-08-04 DIAGNOSIS — D649 Anemia, unspecified: Secondary | ICD-10-CM

## 2017-08-04 DIAGNOSIS — H9193 Unspecified hearing loss, bilateral: Secondary | ICD-10-CM

## 2017-08-04 DIAGNOSIS — S069X4S Unspecified intracranial injury with loss of consciousness of 6 hours to 24 hours, sequela: Secondary | ICD-10-CM

## 2017-08-04 DIAGNOSIS — F22 Delusional disorders: Secondary | ICD-10-CM | POA: Diagnosis not present

## 2017-08-04 DIAGNOSIS — H547 Unspecified visual loss: Secondary | ICD-10-CM | POA: Diagnosis not present

## 2017-08-04 DIAGNOSIS — M170 Bilateral primary osteoarthritis of knee: Secondary | ICD-10-CM | POA: Diagnosis not present

## 2017-08-04 DIAGNOSIS — I872 Venous insufficiency (chronic) (peripheral): Secondary | ICD-10-CM | POA: Diagnosis not present

## 2017-08-04 DIAGNOSIS — Z87828 Personal history of other (healed) physical injury and trauma: Secondary | ICD-10-CM

## 2017-08-04 DIAGNOSIS — I890 Lymphedema, not elsewhere classified: Secondary | ICD-10-CM

## 2017-08-04 MED ORDER — DICLOFENAC SODIUM 1 % TD GEL: 2.0000 g | Freq: Four times a day (QID) | TRANSDERMAL | 5 refills | Status: AC
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
## 2017-08-04 NOTE — Assessment & Plan Note (Signed)
Unclear etiology Over the years, she has maintained a baseline hemoglobin around 9.5 Her MCV is normocytic to slightly macrocytic She denies any bleeding sources At next visit, when labs are obtained, we will repeat CBC with some other studies to determine cause of anemia such as iron panel, B12, folate levels
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
## 2017-08-04 NOTE — Assessment & Plan Note (Signed)
Patient is quite hard of hearing She may benefit in the future from audiology referral for possible hearing aids, but she declines this at this time

## 2017-08-04 NOTE — Assessment & Plan Note (Signed)
Chronic and stable Followed by vascular surgery Continue compression stockings

## 2017-08-04 NOTE — Assessment & Plan Note (Signed)
Chronic Status post bilateral total knee replacement Seeing rheumatology regularly Can continue ibuprofen as needed, but did discuss the long-term risks of NSAID use Prescription for Voltaren gel sent to the pharmacy

## 2017-08-04 NOTE — Assessment & Plan Note (Signed)
Stable Followed by vascular surgery Wearing her compression stockings regularly Also encouraged elevation when sitting

## 2017-08-04 NOTE — Assessment & Plan Note (Signed)
History of traumatic subdural hematoma and TBI in 2016 Patient seems to have worsening falls since that time She is alert and oriented without signs of dementia, but does have some possible delusional thoughts

## 2017-08-09 ENCOUNTER — Other Ambulatory Visit: Payer: Self-pay | Admitting: Otolaryngology

## 2017-08-09 DIAGNOSIS — R1310 Dysphagia, unspecified: Secondary | ICD-10-CM

## 2017-08-19 ENCOUNTER — Ambulatory Visit
Admission: RE | Admit: 2017-08-19 | Discharge: 2017-08-19 | Disposition: A | Discharge status: Home / Self Care | Payer: Medicare Other | Source: Ambulatory Visit | Attending: Otolaryngology | Admitting: Otolaryngology

## 2017-08-19 DIAGNOSIS — R1310 Dysphagia, unspecified: Secondary | ICD-10-CM | POA: Diagnosis present

## 2017-08-19 DIAGNOSIS — R1313 Dysphagia, pharyngeal phase: Secondary | ICD-10-CM
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
## 2017-08-19 NOTE — Therapy (Deleted)
[facility] DIAGNOSTIC RADIOLOGY [address] [place], [place], [address] Phone: [phone]   Fax:     Patient Details  Name: Jaime [name] MRN: [id number] Date of Birth: [birth year] Referring Provider:  Bud Face, MD  Encounter Date: 08/19/2017  Subjective: Patient behavior: (alertness, ability to follow instructions, etc.): Pt was alert and pleasant. Pt is blind and needs prompting during study.  Chief complaint: Pt reports she has been wheezing and that her husband believes it happens after she eats and drinks.    Objective:  Radiological Procedure: A videoflouroscopic evaluation of oral-preparatory, reflex initiation, and pharyngeal phases of the swallow was performed; as well as a screening of the upper esophageal phase.  I. POSTURE: Pt was positioned fully in upright in flouro chair. Pt needed moderate cues to keep chin parallel to the ground as she naturally positioned herself with chin back. II. VIEW: Lateral view III. COMPENSATORY STRATEGIES: Chin tuck (unsuccessful); small sips (unsuccessful); Liquids by spoon (unsuccessful) IV. BOLUSES ADMINISTERED:  Thin Liquid: 3 by straw, 2 by cup sip, 1 by tsp  Nectar-thick Liquid: 3 by straw; 2 by cup sip  Honey-thick Liquid: 2 by cup sip, 2 by tsp  Puree: 2 by tsps  Mechanical Soft: 1 bite of cracker V. RESULTS OF EVALUATION: A. ORAL PREPARATORY PHASE: (The lips, tongue, and velum are observed for strength and coordination) Pt demonstrated good oral phase. No pocketing or holding observed. Appropriate A-P transfer and clearing.  Pt masticated and cleared solid well.        **Overall Severity Rating: Mostly WFL  B. SWALLOW INITIATION/REFLEX: (The reflex is normal if "triggered" by the time the bolus reached the base of the tongue) The swallow triggered at the base of tongue for honey, puree, and solids. Triggered while spilling from valleculae with thin and nectar  **Overall  Severity Rating: Mild-Moderate delay with thin and nectar liquids  PHARYNGEAL PHASE: (Pharyngeal function is normal if the bolus shows rapid, smooth, and continuous transit through the pharynx and there is no pharyngeal residue after the swallow) Pharyngeal phase appeared mostly WFL. No significant oral residue observed.  **Overall Severity Rating: WFL  C. LARYNGEAL PENETRATION: (Material entering into the laryngeal inlet/vestibule but not aspirated) Observed laryngeal penetration x2 with thin and x1 with nectar (SILENT) D. ASPIRATION: Pt aspirated thin x2 and nectar x1 (SILENT) E. ESOPHAGEAL PHASE: (Screening of the upper esophagus) During screening of upper esophagus observed decreased motility towards distal end of esophagus (see radiologist report for details).  ASSESSMENT: This 84 y/o female presents w/moderate pharyngeal dysphagia c/b a swallow delay and decreased laryngeal elevation and excursion resulting in decreased airway protection. Oral phase appeared mostly WFL. No pocketing or holding was observed. Noted appropriate A-P transit time and good clearing of boluses. Pt masticated solid well. Pt demonstrated a mild-moderate swallow delay coupled with decreased airway protection resulting in laryngeal penetration and/or aspiration of thin and nectar thick liquids. Although, only trace amounts were observed to be aspirated, pt's reaction was SILENT. Pt only coughed or throat cleared when cued by clinician. Attempted several compensatory strategies such as chin tuck, small sips, and liquids by tsps. These strategies were unsuccessful in preventing laryngeal penetration or aspiration. No aspiration or laryngeal penetration was observed w/honey thick liquids, puree or solid consistencies. Pt is at an increased risk of aspiration d/t swallow delay and decreased airway protection. Pt decreases her risk with use of aspiration precautions and modified liquid consistencies.   PLAN/RECOMMENDATIONS:  A.  Diet: Regular  diet w/honey thick liquids; Pt may benefit from small sips of plain water between meals with good oral hygiene  B. Swallowing Precautions: Sit fully upright with chin parallel to the ground, small bites and sips, eat slowly  D. Therapy recommendations: Recommend home health speech therapy for Laryngeal/pharyngeal exercises and education  E. Results and recommendations were sent to referring MD. Jeanene Erb MD (left message on nursing voicemail) re: recommendations  F. Educated pt on recommendations for home health speech therapy as well as aspiration precautions. Pt was provided with some honey thick liquids for home use.   [place],Marykate Heuberger 08/19/2017, 1:56 PM  [facility] DIAGNOSTIC RADIOLOGY [address] [place], [place], [address] Phone: [phone]   Fax:

## 2017-09-09 ENCOUNTER — Telehealth: Payer: Self-pay | Admitting: Family Medicine

## 2017-09-09 NOTE — Telephone Encounter (Signed)
Pt contacted office for refill request on the following medications:  PARoxetine (PAXIL) 10 MG tablet  [facility]  Pt stated that Dr. Hyacinth MeekerMiller was managing this medication but pt is requesting that Dr. B start managing medication. Please advise. Thanks TNP

## 2017-09-09 NOTE — Telephone Encounter (Signed)
OK to refill #90 r3.  Thnaks!

## 2017-09-09 NOTE — Telephone Encounter (Signed)
Ok to refill? Please advise. Thanks!  

## 2017-09-13 MED ORDER — PAROXETINE HCL 10 MG PO TABS: 10.0000 mg | ORAL_TABLET | Freq: Every day | ORAL | 3 refills | Status: DC

## 2017-09-13 NOTE — Telephone Encounter (Signed)
Medication sent in. 

## 2017-09-21 ENCOUNTER — Emergency Department: Payer: Medicare Other

## 2017-09-21 ENCOUNTER — Telehealth: Payer: Self-pay

## 2017-09-21 ENCOUNTER — Emergency Department
Admission: EM | Admit: 2017-09-21 | Discharge: 2017-09-21 | Disposition: A | Discharge status: Home / Self Care | Payer: Medicare Other | Attending: Emergency Medicine | Admitting: Emergency Medicine

## 2017-09-21 ENCOUNTER — Ambulatory Visit: Payer: Self-pay | Admitting: Family Medicine

## 2017-09-21 ENCOUNTER — Encounter: Payer: Self-pay | Admitting: Emergency Medicine

## 2017-09-21 DIAGNOSIS — Z87891 Personal history of nicotine dependence: Secondary | ICD-10-CM | POA: Insufficient documentation

## 2017-09-21 DIAGNOSIS — F22 Delusional disorders: Secondary | ICD-10-CM | POA: Insufficient documentation

## 2017-09-21 DIAGNOSIS — R51 Headache: Secondary | ICD-10-CM | POA: Diagnosis not present

## 2017-09-21 DIAGNOSIS — M6281 Muscle weakness (generalized): Secondary | ICD-10-CM | POA: Diagnosis not present

## 2017-09-21 DIAGNOSIS — Z79899 Other long term (current) drug therapy: Secondary | ICD-10-CM | POA: Diagnosis not present

## 2017-09-21 DIAGNOSIS — R42 Dizziness and giddiness: Secondary | ICD-10-CM | POA: Insufficient documentation

## 2017-09-21 DIAGNOSIS — R531 Weakness: Secondary | ICD-10-CM

## 2017-09-21 LAB — URINALYSIS, COMPLETE (UACMP) WITH MICROSCOPIC
BACTERIA UA: NONE SEEN
BILIRUBIN URINE: NEGATIVE
Glucose, UA: NEGATIVE mg/dL
HGB URINE DIPSTICK: NEGATIVE
Ketones, ur: NEGATIVE mg/dL
LEUKOCYTES UA: NEGATIVE
NITRITE: NEGATIVE
Protein, ur: NEGATIVE mg/dL
SPECIFIC GRAVITY, URINE: 1.008 (ref 1.005–1.030)
Squamous Epithelial / LPF: NONE SEEN (ref 0–5)
WBC, UA: NONE SEEN WBC/hpf (ref 0–5)
pH: 7 (ref 5.0–8.0)

## 2017-09-21 LAB — CBC WITH DIFFERENTIAL/PLATELET
BASOS ABS: 0.1 10*3/uL (ref 0–0.1)
BASOS PCT: 1 %
EOS ABS: 0.1 10*3/uL (ref 0–0.7)
Eosinophils Relative: 1 %
HCT: 28.5 % — ABNORMAL LOW (ref 35.0–47.0)
HEMOGLOBIN: 9.5 g/dL — AB (ref 12.0–16.0)
Lymphocytes Relative: 23 %
Lymphs Abs: 2 10*3/uL (ref 1.0–3.6)
MCH: 33.8 pg (ref 26.0–34.0)
MCHC: 33.4 g/dL (ref 32.0–36.0)
MCV: 101.1 fL — ABNORMAL HIGH (ref 80.0–100.0)
Monocytes Absolute: 0.7 10*3/uL (ref 0.2–0.9)
Monocytes Relative: 8 %
NEUTROS PCT: 67 %
Neutro Abs: 5.6 10*3/uL (ref 1.4–6.5)
Platelets: 240 10*3/uL (ref 150–440)
RBC: 2.82 MIL/uL — ABNORMAL LOW (ref 3.80–5.20)
RDW: 26.7 % — ABNORMAL HIGH (ref 11.5–14.5)
WBC: 8.4 10*3/uL (ref 3.6–11.0)

## 2017-09-21 LAB — BASIC METABOLIC PANEL
ANION GAP: 15 (ref 5–15)
BUN: 17 mg/dL (ref 6–20)
CO2: 23 mmol/L (ref 22–32)
CREATININE: 0.59 mg/dL (ref 0.44–1.00)
Calcium: 9.6 mg/dL (ref 8.9–10.3)
Chloride: 103 mmol/L (ref 101–111)
Glucose, Bld: 88 mg/dL (ref 65–99)
Potassium: 4.3 mmol/L (ref 3.5–5.1)
Sodium: 141 mmol/L (ref 135–145)

## 2017-09-21 MED ORDER — SODIUM CHLORIDE 0.9 % IV BOLUS
1000.0000 mL | Freq: Once | INTRAVENOUS | Status: AC
  Administered 2017-09-21: 1000 mL via INTRAVENOUS

## 2017-09-21 MED ORDER — ACETAMINOPHEN 500 MG PO TABS
1000.0000 mg | ORAL_TABLET | Freq: Once | ORAL | Status: AC
  Administered 2017-09-21: 1000 mg via ORAL
  Filled 2017-09-21: qty 2
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
## 2017-09-21 NOTE — ED Provider Notes (Signed)
-----------------------------------------   5:27 PM on 09/21/2017 -----------------------------------------  I received signout on this patient from Dr. Scotty CourtStafford.  The patient presented with dizziness and had a negative work-up.  The plan was to await the results of urinalysis, and if negative, the patient was to be discharged home.  UA shows no acute findings.  I reassessed the patient and spoke to her and her husband.  They are comfortable with the discharge plan.  She is stable for discharge at this time.  Return precautions given, and they expressed understanding.   Dionne BucySiadecki, Kiam Bransfield, MD 09/21/17 1728

## 2017-09-23 ENCOUNTER — Telehealth: Payer: Self-pay | Admitting: Family Medicine

## 2017-09-23 NOTE — Telephone Encounter (Signed)
Jaime BurtonEmily, please call patient back.

## 2017-09-24 LAB — URINE CULTURE: Culture: 20000 — AB

## 2017-09-27 ENCOUNTER — Encounter: Payer: Self-pay | Admitting: Family Medicine

## 2017-09-27 ENCOUNTER — Ambulatory Visit: Payer: Medicare Other | Admitting: Family Medicine

## 2017-09-27 VITALS — BP 140/70 | HR 75 | Temp 98.2°F | Resp 16

## 2017-09-27 DIAGNOSIS — R42 Dizziness and giddiness: Secondary | ICD-10-CM

## 2017-09-27 DIAGNOSIS — N3090 Cystitis, unspecified without hematuria: Secondary | ICD-10-CM

## 2017-09-27 DIAGNOSIS — F22 Delusional disorders: Secondary | ICD-10-CM

## 2017-09-27 MED ORDER — SULFAMETHOXAZOLE-TRIMETHOPRIM 800-160 MG PO TABS: 1.0000 | ORAL_TABLET | Freq: Two times a day (BID) | ORAL | 0 refills | Status: AC

## 2017-09-27 NOTE — Patient Instructions (Signed)

## 2017-09-27 NOTE — Telephone Encounter (Signed)
Pt has appointment today regarding ER FU.
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
## 2017-09-28 DIAGNOSIS — N3090 Cystitis, unspecified without hematuria: Secondary | ICD-10-CM | POA: Insufficient documentation

## 2017-09-28 DIAGNOSIS — R42 Dizziness and giddiness: Secondary | ICD-10-CM | POA: Insufficient documentation

## 2017-09-28 NOTE — Assessment & Plan Note (Signed)
Patient does report some symptoms of cystitis including weakness, confusion, urinary frequency and urgency She has no abdominal pain or dysuria at this time She has had no fevers Her urine culture did show a small amount of E. coli, but given her symptoms, I will empirically treat with Bactrim for 5 days Discussed return precautions
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
## 2017-09-28 NOTE — Assessment & Plan Note (Signed)
Chronic condition Suspect this is related to the patient's blindness and lower extremity weakness and imbalance Discussed with her and her husband extensively regarding the 3 symptoms that contribute to balance and how she is missing 2 of these Will refer to neurology for further work-up and evaluation

## 2017-10-12 ENCOUNTER — Other Ambulatory Visit: Payer: Self-pay

## 2017-10-12 ENCOUNTER — Inpatient Hospital Stay
Admission: EM | Admit: 2017-10-12 | Discharge: 2017-10-14 | DRG: 690 | Disposition: A | Discharge status: Home Health | Payer: Medicare Other | Attending: Internal Medicine | Admitting: Internal Medicine

## 2017-10-12 ENCOUNTER — Emergency Department: Payer: Medicare Other

## 2017-10-12 ENCOUNTER — Encounter: Payer: Self-pay | Admitting: *Deleted

## 2017-10-12 DIAGNOSIS — I4510 Unspecified right bundle-branch block: Secondary | ICD-10-CM | POA: Diagnosis not present

## 2017-10-12 DIAGNOSIS — D638 Anemia in other chronic diseases classified elsewhere: Secondary | ICD-10-CM | POA: Diagnosis present

## 2017-10-12 DIAGNOSIS — M1990 Unspecified osteoarthritis, unspecified site: Secondary | ICD-10-CM | POA: Diagnosis present

## 2017-10-12 DIAGNOSIS — Z8614 Personal history of Methicillin resistant Staphylococcus aureus infection: Secondary | ICD-10-CM | POA: Diagnosis not present

## 2017-10-12 DIAGNOSIS — Z881 Allergy status to other antibiotic agents status: Secondary | ICD-10-CM

## 2017-10-12 DIAGNOSIS — W19XXXA Unspecified fall, initial encounter: Secondary | ICD-10-CM

## 2017-10-12 DIAGNOSIS — Y92008 Other place in unspecified non-institutional (private) residence as the place of occurrence of the external cause: Secondary | ICD-10-CM

## 2017-10-12 DIAGNOSIS — I493 Ventricular premature depolarization: Secondary | ICD-10-CM | POA: Diagnosis not present

## 2017-10-12 DIAGNOSIS — K219 Gastro-esophageal reflux disease without esophagitis: Secondary | ICD-10-CM | POA: Diagnosis not present

## 2017-10-12 DIAGNOSIS — N390 Urinary tract infection, site not specified: Secondary | ICD-10-CM | POA: Diagnosis not present

## 2017-10-12 DIAGNOSIS — Z885 Allergy status to narcotic agent status: Secondary | ICD-10-CM | POA: Diagnosis not present

## 2017-10-12 DIAGNOSIS — S0003XA Contusion of scalp, initial encounter: Secondary | ICD-10-CM | POA: Diagnosis present

## 2017-10-12 DIAGNOSIS — R55 Syncope and collapse: Secondary | ICD-10-CM | POA: Diagnosis not present

## 2017-10-12 DIAGNOSIS — Z9181 History of falling: Secondary | ICD-10-CM

## 2017-10-12 DIAGNOSIS — Z7951 Long term (current) use of inhaled steroids: Secondary | ICD-10-CM | POA: Diagnosis not present

## 2017-10-12 DIAGNOSIS — R262 Difficulty in walking, not elsewhere classified: Secondary | ICD-10-CM | POA: Diagnosis present

## 2017-10-12 DIAGNOSIS — Z87891 Personal history of nicotine dependence: Secondary | ICD-10-CM

## 2017-10-12 DIAGNOSIS — Z96651 Presence of right artificial knee joint: Secondary | ICD-10-CM | POA: Diagnosis present

## 2017-10-12 DIAGNOSIS — R2681 Unsteadiness on feet: Secondary | ICD-10-CM | POA: Diagnosis not present

## 2017-10-12 DIAGNOSIS — T50995A Adverse effect of other drugs, medicaments and biological substances, initial encounter: Secondary | ICD-10-CM | POA: Diagnosis present

## 2017-10-12 DIAGNOSIS — H548 Legal blindness, as defined in USA: Secondary | ICD-10-CM | POA: Diagnosis present

## 2017-10-12 DIAGNOSIS — Z79899 Other long term (current) drug therapy: Secondary | ICD-10-CM

## 2017-10-12 DIAGNOSIS — Y92009 Unspecified place in unspecified non-institutional (private) residence as the place of occurrence of the external cause: Secondary | ICD-10-CM

## 2017-10-12 DIAGNOSIS — H9190 Unspecified hearing loss, unspecified ear: Secondary | ICD-10-CM | POA: Diagnosis present

## 2017-10-12 LAB — COMPREHENSIVE METABOLIC PANEL
ALBUMIN: 4.1 g/dL (ref 3.5–5.0)
ALT: 10 U/L (ref 0–44)
AST: 16 U/L (ref 15–41)
Alkaline Phosphatase: 41 U/L (ref 38–126)
Anion gap: 6 (ref 5–15)
BUN: 20 mg/dL (ref 8–23)
CHLORIDE: 109 mmol/L (ref 98–111)
CO2: 26 mmol/L (ref 22–32)
CREATININE: 0.59 mg/dL (ref 0.44–1.00)
Calcium: 8.8 mg/dL — ABNORMAL LOW (ref 8.9–10.3)
GFR calc Af Amer: >60 mL/min (ref >60)
GLUCOSE: 110 mg/dL — AB (ref 70–99)
POTASSIUM: 4.1 mmol/L (ref 3.5–5.1)
Sodium: 141 mmol/L (ref 135–145)
Total Bilirubin: 1 mg/dL (ref 0.3–1.2)
Total Protein: 6.8 g/dL (ref 6.5–8.1)

## 2017-10-12 LAB — CBC WITH DIFFERENTIAL/PLATELET
Basophils Absolute: 0.1 10*3/uL (ref 0–0.1)
Basophils Relative: 1 %
EOS ABS: 0.1 10*3/uL (ref 0–0.7)
EOS PCT: 1 %
HCT: 28.2 % — ABNORMAL LOW (ref 35.0–47.0)
Hemoglobin: 9.3 g/dL — ABNORMAL LOW (ref 12.0–16.0)
LYMPHS ABS: 1.6 10*3/uL (ref 1.0–3.6)
LYMPHS PCT: 16 %
MCH: 33.6 pg (ref 26.0–34.0)
MCHC: 33 g/dL (ref 32.0–36.0)
MCV: 101.8 fL — ABNORMAL HIGH (ref 80.0–100.0)
MONO ABS: 0.7 10*3/uL (ref 0.2–0.9)
MONOS PCT: 7 %
Neutro Abs: 7.3 10*3/uL — ABNORMAL HIGH (ref 1.4–6.5)
Neutrophils Relative %: 75 %
PLATELETS: 233 10*3/uL (ref 150–440)
RBC: 2.77 MIL/uL — ABNORMAL LOW (ref 3.80–5.20)
RDW: 27.3 % — ABNORMAL HIGH (ref 11.5–14.5)
WBC: 9.7 10*3/uL (ref 3.6–11.0)

## 2017-10-12 LAB — URINALYSIS, COMPLETE (UACMP) WITH MICROSCOPIC
Bilirubin Urine: NEGATIVE
Glucose, UA: NEGATIVE mg/dL
Hgb urine dipstick: NEGATIVE
KETONES UR: NEGATIVE mg/dL
Nitrite: NEGATIVE
PROTEIN: NEGATIVE mg/dL
Specific Gravity, Urine: 1.018 (ref 1.005–1.030)
pH: 6 (ref 5.0–8.0)

## 2017-10-12 LAB — MAGNESIUM: Magnesium: 2.2 mg/dL (ref 1.7–2.4)

## 2017-10-12 LAB — TROPONIN I: Troponin I: <0.03 ng/mL (ref <0.03)

## 2017-10-12 MED ORDER — ACETAMINOPHEN 500 MG PO TABS
1000.0000 mg | ORAL_TABLET | Freq: Once | ORAL | Status: AC
  Administered 2017-10-12: 1000 mg via ORAL
  Filled 2017-10-12: qty 2

## 2017-10-12 MED ORDER — SODIUM CHLORIDE 0.9 % IV BOLUS
500.0000 mL | Freq: Once | INTRAVENOUS | Status: AC
  Administered 2017-10-12: 500 mL via INTRAVENOUS

## 2017-10-12 NOTE — ED Triage Notes (Addendum)
Pt brought in via [facility] from home.  Pt fell in the driveway.  Pt has a hematoma to right side of head. Pt has a headache.    No loc.  No vomiting.  No lac or abrasions.  Pt denies other injury.  Pt alert.  Speech clear.

## 2017-10-12 NOTE — ED Notes (Signed)
Patient transported to CT 

## 2017-10-12 NOTE — ED Notes (Signed)
First Nurse: Pt brought in from Home via [facility] with c/o a fall. Pt was getting out of car stumbled back, and hit head. There is a contusion on the back of her head. No LOC, pt is legally blind.
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
## 2017-10-13 ENCOUNTER — Other Ambulatory Visit: Payer: Self-pay

## 2017-10-13 DIAGNOSIS — N390 Urinary tract infection, site not specified: Secondary | ICD-10-CM | POA: Diagnosis not present

## 2017-10-13 DIAGNOSIS — S0003XA Contusion of scalp, initial encounter: Secondary | ICD-10-CM | POA: Diagnosis not present

## 2017-10-13 DIAGNOSIS — R55 Syncope and collapse: Secondary | ICD-10-CM | POA: Diagnosis not present

## 2017-10-13 DIAGNOSIS — K219 Gastro-esophageal reflux disease without esophagitis: Secondary | ICD-10-CM | POA: Diagnosis not present

## 2017-10-13 DIAGNOSIS — Z96651 Presence of right artificial knee joint: Secondary | ICD-10-CM | POA: Diagnosis not present

## 2017-10-13 DIAGNOSIS — D638 Anemia in other chronic diseases classified elsewhere: Secondary | ICD-10-CM | POA: Diagnosis not present

## 2017-10-13 DIAGNOSIS — I493 Ventricular premature depolarization: Secondary | ICD-10-CM | POA: Diagnosis not present

## 2017-10-13 DIAGNOSIS — I4510 Unspecified right bundle-branch block: Secondary | ICD-10-CM | POA: Diagnosis not present

## 2017-10-13 DIAGNOSIS — H9190 Unspecified hearing loss, unspecified ear: Secondary | ICD-10-CM | POA: Diagnosis not present

## 2017-10-13 DIAGNOSIS — Z9181 History of falling: Secondary | ICD-10-CM | POA: Diagnosis not present

## 2017-10-13 DIAGNOSIS — M1990 Unspecified osteoarthritis, unspecified site: Secondary | ICD-10-CM | POA: Diagnosis not present

## 2017-10-13 DIAGNOSIS — T50995A Adverse effect of other drugs, medicaments and biological substances, initial encounter: Secondary | ICD-10-CM | POA: Diagnosis not present

## 2017-10-13 DIAGNOSIS — Y92008 Other place in unspecified non-institutional (private) residence as the place of occurrence of the external cause: Secondary | ICD-10-CM | POA: Diagnosis not present

## 2017-10-13 DIAGNOSIS — Z881 Allergy status to other antibiotic agents status: Secondary | ICD-10-CM | POA: Diagnosis not present

## 2017-10-13 DIAGNOSIS — H548 Legal blindness, as defined in USA: Secondary | ICD-10-CM | POA: Diagnosis not present

## 2017-10-13 DIAGNOSIS — R262 Difficulty in walking, not elsewhere classified: Secondary | ICD-10-CM | POA: Diagnosis present

## 2017-10-13 DIAGNOSIS — R2681 Unsteadiness on feet: Secondary | ICD-10-CM | POA: Diagnosis not present

## 2017-10-13 DIAGNOSIS — Z885 Allergy status to narcotic agent status: Secondary | ICD-10-CM | POA: Diagnosis not present

## 2017-10-13 DIAGNOSIS — Z87891 Personal history of nicotine dependence: Secondary | ICD-10-CM | POA: Diagnosis not present

## 2017-10-13 DIAGNOSIS — Z8614 Personal history of Methicillin resistant Staphylococcus aureus infection: Secondary | ICD-10-CM | POA: Diagnosis not present

## 2017-10-13 LAB — BASIC METABOLIC PANEL
ANION GAP: 4 — AB (ref 5–15)
BUN: 16 mg/dL (ref 8–23)
CO2: 27 mmol/L (ref 22–32)
Calcium: 8.7 mg/dL — ABNORMAL LOW (ref 8.9–10.3)
Chloride: 110 mmol/L (ref 98–111)
Creatinine, Ser: 0.58 mg/dL (ref 0.44–1.00)
GFR calc Af Amer: >60 mL/min (ref >60)
GLUCOSE: 113 mg/dL — AB (ref 70–99)
POTASSIUM: 3.8 mmol/L (ref 3.5–5.1)
Sodium: 141 mmol/L (ref 135–145)

## 2017-10-13 LAB — CBC
HEMATOCRIT: 26.3 % — AB (ref 35.0–47.0)
HEMOGLOBIN: 8.7 g/dL — AB (ref 12.0–16.0)
MCH: 33.9 pg (ref 26.0–34.0)
MCHC: 33.1 g/dL (ref 32.0–36.0)
MCV: 102.3 fL — ABNORMAL HIGH (ref 80.0–100.0)
Platelets: 241 10*3/uL (ref 150–440)
RBC: 2.57 MIL/uL — AB (ref 3.80–5.20)
RDW: 27.4 % — ABNORMAL HIGH (ref 11.5–14.5)
WBC: 7.7 10*3/uL (ref 3.6–11.0)

## 2017-10-13 LAB — GLUCOSE, CAPILLARY
GLUCOSE-CAPILLARY: 66 mg/dL — AB (ref 70–99)
GLUCOSE-CAPILLARY: 74 mg/dL (ref 70–99)

## 2017-10-13 LAB — TROPONIN I: Troponin I: <0.03 ng/mL (ref <0.03)

## 2017-10-13 MED ORDER — HEPARIN SODIUM (PORCINE) 5000 UNIT/ML IJ SOLN
5000.0000 [IU] | Freq: Three times a day (TID) | INTRAMUSCULAR | Status: DC
  Administered 2017-10-13 – 2017-10-14 (×4): 5000 [IU] via SUBCUTANEOUS
  Filled 2017-10-13 (×4): qty 1

## 2017-10-13 MED ORDER — FERROUS SULFATE 325 (65 FE) MG PO TABS
325.0000 mg | ORAL_TABLET | Freq: Every day | ORAL | Status: DC
  Administered 2017-10-13 – 2017-10-14 (×2): 325 mg via ORAL
  Filled 2017-10-13 (×2): qty 1

## 2017-10-13 MED ORDER — ACETAMINOPHEN 650 MG RE SUPP: 650.0000 mg | Freq: Four times a day (QID) | RECTAL | Status: DC | PRN

## 2017-10-13 MED ORDER — SODIUM CHLORIDE 0.9 % IV SOLN
Freq: Once | INTRAVENOUS | Status: AC
  Administered 2017-10-13: 03:00:00 via INTRAVENOUS

## 2017-10-13 MED ORDER — POLYVINYL ALCOHOL 1.4 % OP SOLN
1.0000 [drp] | OPHTHALMIC | Status: DC | PRN
  Filled 2017-10-13: qty 15

## 2017-10-13 MED ORDER — IRON 18 MG PO TBCR: 65.0000 mg | EXTENDED_RELEASE_TABLET | Freq: Every day | ORAL | Status: DC

## 2017-10-13 MED ORDER — FLUTICASONE FUROATE-VILANTEROL 100-25 MCG/INH IN AEPB
1.0000 | INHALATION_SPRAY | Freq: Every day | RESPIRATORY_TRACT | Status: DC
  Administered 2017-10-13 – 2017-10-14 (×2): 1 via RESPIRATORY_TRACT
  Filled 2017-10-13: qty 28

## 2017-10-13 MED ORDER — PANTOPRAZOLE SODIUM 40 MG PO TBEC
40.0000 mg | DELAYED_RELEASE_TABLET | Freq: Every day | ORAL | Status: DC
  Administered 2017-10-13 – 2017-10-14 (×2): 40 mg via ORAL
  Filled 2017-10-13 (×2): qty 1

## 2017-10-13 MED ORDER — BISACODYL 5 MG PO TBEC: 5.0000 mg | DELAYED_RELEASE_TABLET | Freq: Every day | ORAL | Status: DC | PRN

## 2017-10-13 MED ORDER — FLUTICASONE PROPIONATE 50 MCG/ACT NA SUSP
1.0000 | Freq: Every day | NASAL | Status: DC | PRN
  Filled 2017-10-13: qty 16

## 2017-10-13 MED ORDER — ALUM & MAG HYDROXIDE-SIMETH 200-200-20 MG/5ML PO SUSP
15.0000 mL | Freq: Four times a day (QID) | ORAL | Status: DC | PRN
  Administered 2017-10-13: 15 mL via ORAL
  Filled 2017-10-13: qty 30

## 2017-10-13 MED ORDER — PAROXETINE HCL 10 MG PO TABS
10.0000 mg | ORAL_TABLET | Freq: Every day | ORAL | Status: DC
  Administered 2017-10-13 – 2017-10-14 (×2): 10 mg via ORAL
  Filled 2017-10-13 (×2): qty 1

## 2017-10-13 MED ORDER — ALBUTEROL SULFATE (2.5 MG/3ML) 0.083% IN NEBU: 2.5000 mg | INHALATION_SOLUTION | RESPIRATORY_TRACT | Status: DC | PRN

## 2017-10-13 MED ORDER — DOCUSATE SODIUM 100 MG PO CAPS
100.0000 mg | ORAL_CAPSULE | Freq: Two times a day (BID) | ORAL | Status: DC
  Administered 2017-10-13 – 2017-10-14 (×3): 100 mg via ORAL
  Filled 2017-10-13 (×3): qty 1

## 2017-10-13 MED ORDER — HYDRALAZINE HCL 20 MG/ML IJ SOLN: 10.0000 mg | Freq: Four times a day (QID) | INTRAMUSCULAR | Status: DC | PRN

## 2017-10-13 MED ORDER — ACETAMINOPHEN 325 MG PO TABS
650.0000 mg | ORAL_TABLET | Freq: Four times a day (QID) | ORAL | Status: DC | PRN
  Administered 2017-10-13: 06:00:00 650 mg via ORAL
  Filled 2017-10-13: qty 2

## 2017-10-13 MED ORDER — ONDANSETRON HCL 4 MG/2ML IJ SOLN: 4.0000 mg | Freq: Four times a day (QID) | INTRAMUSCULAR | Status: DC | PRN

## 2017-10-13 MED ORDER — MAGNESIUM OXIDE 400 (241.3 MG) MG PO TABS
400.0000 mg | ORAL_TABLET | Freq: Every day | ORAL | Status: DC
  Administered 2017-10-13 – 2017-10-14 (×2): 400 mg via ORAL
  Filled 2017-10-13 (×2): qty 1

## 2017-10-13 MED ORDER — ONDANSETRON HCL 4 MG PO TABS: 4.0000 mg | ORAL_TABLET | Freq: Four times a day (QID) | ORAL | Status: DC | PRN

## 2017-10-13 MED ORDER — VITAMIN D 1000 UNITS PO TABS
1000.0000 [IU] | ORAL_TABLET | Freq: Every day | ORAL | Status: DC
  Administered 2017-10-13 – 2017-10-14 (×2): 1000 [IU] via ORAL
  Filled 2017-10-13 (×2): qty 1

## 2017-10-13 MED ORDER — TRAZODONE HCL 50 MG PO TABS: 25.0000 mg | ORAL_TABLET | Freq: Every evening | ORAL | Status: DC | PRN

## 2017-10-13 NOTE — Progress Notes (Signed)
Patient briefly seen and examined.  Continue management as per admitting MD.  Await PT consultation.

## 2017-10-13 NOTE — Evaluation (Signed)
Physical Therapy Evaluation Patient Details Name: Jaime [name] MRN: [id number] DOB: 08-Dec-1927 Today's Date: 10/13/2017   History of Present Illness  84 y.o. female who is blind and hard of hearing, history of chronic anemia, hiatal hernia, herniated disc, left leg lymphedema and other comorbidities. Brought to ED 7/3 status post fall at home.  Patient was getting out of the car and lost her balance and fell on her back, hitting the back of the head to the pavement.  She complains of generalized weakness and lightheadedness, when standing up, going on for the past few weeks.  She has a history of frequent falls, due to her poor balance, blindness and hearing deficit. Patient denies any fever or chills, no chest pain, no LOC, imaging negative for fractures. CT of the head is negative for acute intracranial abnormalities, positive for UTI  Clinical Impression  Pt is was able to get to sitting relatively well but showed unsteadiness and weakness in standing along with much increased need for UE support than baseline (she normally is relatively independent with cane) as she needed assist and the walker to perform a modest 25 ft bout of ambulation.  Pt should be safe to return home with husband but will require HHPT to get back to her prior level of function.    Follow Up Recommendations Home health PT    Equipment Recommendations  (Instructed pt to initially use walker when she returns home)    Recommendations for Other Services       Precautions / Restrictions Precautions Precautions: Fall Precaution Comments: blind Restrictions Weight Bearing Restrictions: No      Mobility  Bed Mobility Overal bed mobility: Modified Independent             General bed mobility comments: Pt needed a little assist to get scooted up to EOB, but able to attain sitting w/o direct assist, needed some extra time  Transfers Overall transfer level: Needs assistance Equipment used: Rolling walker  (2 wheeled) Transfers: Sit to/from Stand Sit to Stand: Min assist         General transfer comment: Pt unable to rise on first attempt, some unsteadiness and posterior lean.  Light assist to keep weight forward on next attempt  Ambulation/Gait Ambulation/Gait assistance: Min assist Gait Distance (Feet): 25 Feet Assistive device: Rolling walker (2 wheeled)       General Gait Details: Secondary to vision impairment pt needed constant directional cuing, but she also struggled to maintain balance with poor coordination in LEs and inability to effectively use walker with multiple stagger steps and near LOBs needing assist to maintain upright  Stairs            Wheelchair Mobility    Modified Rankin (Stroke Patients Only)       Balance Overall balance assessment: Needs assistance Sitting-balance support: Feet supported;Bilateral upper extremity supported Sitting balance-Leahy Scale: Good   Postural control: Posterior lean   Standing balance-Leahy Scale: Poor Standing balance comment: Statically she has some posterior lean and unsteadiness with walker, dynamic balance was poor                             Pertinent Vitals/Pain Pain Assessment: No/denies pain    Home Living Family/patient expects to be discharged to:: Private residence Living Arrangements: Spouse/significant other Available Help at Discharge: Family;Available 24 hours/day;Other (Comment) Type of Home: House Home Access: Ramped entrance     Home Layout: One level Home Equipment:  Shower seat;Cane - single point;Walker - 2 wheels      Prior Function Level of Independence: Independent with assistive device(s)         Comments: Generally modified indep with ambulation using SPC primarily and has service dog, [place]. Spouse with her 24/7. Pt/spouse endorse dizziness/near syncope with an unknown cause over past several weeks resulting in frequent LOB/falling 2/2 to that.      Hand  Dominance   Dominant Hand: Right    Extremity/Trunk Assessment   Upper Extremity Assessment Upper Extremity Assessment: Defer to OT evaluation RUE Deficits / Details: shoulder flexion 3+/5 2/2 shoulder arthritis, otherwise 4/5 RUE Sensation: WNL RUE Coordination: WNL LUE Deficits / Details: grossly 4/5 LUE Sensation: WNL LUE Coordination: WNL    Lower Extremity Assessment Lower Extremity Assessment: Overall WFL for tasks assessed    Cervical / Trunk Assessment Cervical / Trunk Assessment: Normal  Communication   Communication: HOH  Cognition Arousal/Alertness: Awake/alert Behavior During Therapy: WFL for tasks assessed/performed Overall Cognitive Status: Within Functional Limits for tasks assessed                                        General Comments      Exercises Other Exercises Other Exercises: pt/spouse instructed in basic energy conservation principles, would benefit additional training to support implementation and carryover into daily routines within home environment to maximize safety and independence   Assessment/Plan    PT Assessment Patient needs continued PT services  PT Problem List Decreased strength;Decreased range of motion;Decreased activity tolerance;Decreased balance;Decreased mobility;Decreased coordination;Decreased knowledge of use of DME;Decreased safety awareness       PT Treatment Interventions DME instruction;Gait training;Stair training;Functional mobility training;Therapeutic activities;Therapeutic exercise;Balance training;Neuromuscular re-education;Patient/family education    PT Goals (Current goals can be found in the Care Plan section)  Acute Rehab PT Goals Patient Stated Goal: go home PT Goal Formulation: With patient Time For Goal Achievement: 10/27/17 Potential to Achieve Goals: Good    Frequency Min 2X/week   Barriers to discharge        Co-evaluation               AM-PAC PT "6 Clicks" Daily  Activity  Outcome Measure Difficulty turning over in bed (including adjusting bedclothes, sheets and blankets)?: A Little Difficulty moving from lying on back to sitting on the side of the bed? : A Lot Difficulty sitting down on and standing up from a chair with arms (e.g., wheelchair, bedside commode, etc,.)?: Unable Help needed moving to and from a bed to chair (including a wheelchair)?: A Little Help needed walking in hospital room?: A Lot Help needed climbing 3-5 steps with a railing? : A Lot 6 Click Score: 13    End of Session Equipment Utilized During Treatment: Gait belt Activity Tolerance: Patient limited by fatigue Patient left: with call bell/phone within reach;in chair(OT entering room) Nurse Communication: Mobility status PT Visit Diagnosis: Muscle weakness (generalized) (M62.81);Difficulty in walking, not elsewhere classified (R26.2);Unsteadiness on feet (R26.81)    Time: 1340-1405 PT Time Calculation (min) (ACUTE ONLY): 25 min   Charges:   PT Evaluation $PT Eval Low Complexity: 1 Low     PT G CodesMalachi Pro:        Mykle Pascua R Jaxxson Cavanah, DPT 10/13/2017, 5:42 PM

## 2017-10-13 NOTE — Evaluation (Signed)
Occupational Therapy Evaluation Patient Details Name: Jaime [name] MRN: [id number] DOB: [birth year] Today's Date: 10/13/2017    History of Present Illness 84 y.o. female, legally blind and hard of hearing, with a known history of chronic anemia, hiatal hernia, herniated disc, left leg lymphedema and other comorbidities. Brought to ED 7/3 status post fall at home.  Patient was getting out of the car and lost her balance and fell on her back, hitting the back of the head to the pavement.  She complains of generalized weakness and lightheadedness, when standing up, going on for the past few weeks.  She has a history of frequent falls, due to her poor balance, blindness and hearing deficit. Patient denies any fever or chills, no chest pain, no LOC. Blood test done emergency room are remarkable for low hemoglobin level at 9.3.  Troponin level is less than 0.03.  UA is positive for UTI. CT of the head is negative for acute intracranial abnormalities.  Cervical spine CT scan is negative for fractures.   Clinical Impression   Pt seen for OT evaluation this date. Prior to hospital admission, pt was generally independent with ADL, ambulating with a SPC and her service dog, Phoenix, and had spouse to assist with IADL and driving.  Pt lives with her spouse in a 1 story home with environmental supports for her long standing (since childhood) visual impairments. 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 Currently pt demonstrates impairments in activity tolerance and balance requiring min-mod assist for bathing, dressing, and toileting tasks in order to maximize safety. Pt required CGA to min assist to correct slight LOBs, with max verbal cues for RW safety and direction to negotiate unfamiliar hospital room environment. Pt/spouse instructed in energy conservation strategies and falls prevention strategies. Pt would benefit from continued skilled OT to address  noted impairments and functional limitations (see below for any additional details) in order to maximize safety and independence while minimizing falls risk and caregiver burden.  Upon hospital discharge, recommend pt discharge to home with continued spousal assist and HHOT services.     Follow Up Recommendations  Home health OT    Equipment Recommendations  Other (comment)(TBD at next venue of care)    Recommendations for Other Services       Precautions / Restrictions Precautions Precautions: Fall;Other (comment) Precaution Comments: blind Restrictions Weight Bearing Restrictions: No      Mobility Bed Mobility               General bed mobility comments: deferred, up in recliner  Transfers Overall transfer level: Needs assistance Equipment used: Rolling walker (2 wheeled) Transfers: Sit to/from Stand Sit to Stand: Min guard;Min assist         General transfer comment: verbal cues to lean forward to minimize posterior LOB    Balance Overall balance assessment: Needs assistance Sitting-balance support: Feet supported;Bilateral upper extremity supported Sitting balance-Leahy Scale: Good   Postural control: Posterior lean   Standing balance-Leahy Scale: Fair Standing balance comment: CGA to min assist to correct LOB                           ADL either performed or assessed with clinical judgement   ADL Overall ADL's : Needs assistance/impaired     Grooming: Sitting;Set up;Supervision/safety   Upper Body Bathing: Sitting;Set up;Supervision/ safety   Lower Body Bathing: Sitting/lateral leans;Minimal assistance   Upper Body  Dressing : Sitting;Set up;Supervision/safety   Lower Body Dressing: Sitting/lateral leans;Minimal assistance   Toilet Transfer: RW;BSC;Ambulation;Minimal assistance;Min guard Toilet Transfer Details (indicate cue type and reason): max VC for directional cues 2/2 blindness, verbal and tactile cues on RW for direction, CGA  to min assist for slight LOB Toileting- Clothing Manipulation and Hygiene: Sitting/lateral lean;Set up;Moderate assistance;Sit to/from stand Toileting - Clothing Manipulation Details (indicate cue type and reason): set up for seated hygiene, mod assist to don tab style briefs in standing to minimize LOB     Functional mobility during ADLs: Min guard;Minimal assistance;Cueing for sequencing;Cueing for safety;Rolling walker       Vision Baseline Vision/History: Legally blind Patient Visual Report: No change from baseline Additional Comments: pt blind since childhood, has service dog     Perception     Praxis      Pertinent Vitals/Pain Pain Assessment: No/denies pain     Hand Dominance Right   Extremity/Trunk Assessment Upper Extremity Assessment Upper Extremity Assessment: RUE deficits/detail;LUE deficits/detail RUE Deficits / Details: shoulder flexion 3+/5 2/2 shoulder arthritis, otherwise 4/5 RUE Sensation: WNL RUE Coordination: WNL LUE Deficits / Details: grossly 4/5 LUE Sensation: WNL LUE Coordination: WNL   Lower Extremity Assessment Lower Extremity Assessment: Defer to PT evaluation   Cervical / Trunk Assessment Cervical / Trunk Assessment: Normal   Communication Communication Communication: HOH   Cognition Arousal/Alertness: Awake/alert Behavior During Therapy: WFL for tasks assessed/performed Overall Cognitive Status: Within Functional Limits for tasks assessed                                     General Comments       Exercises Other Exercises Other Exercises: pt/spouse instructed in basic energy conservation principles, would benefit additional training to support implementation and carryover into daily routines within home environment to maximize safety and independence   Shoulder Instructions      Home Living Family/patient expects to be discharged to:: Private residence Living Arrangements: Spouse/significant other Available Help  at Discharge: Family;Available 24 hours/day;Other (Comment)(service dog) Type of Home: House Home Access: Ramped entrance     Home Layout: One level     Bathroom Shower/Tub: Walk-in shower;Door   Foot LockerBathroom Toilet: Handicapped height     Home Equipment: Production assistant, radiohower seat;Cane - single point          Prior Functioning/Environment Level of Independence: Independent with assistive device(s)        Comments: Generally modified indep with ambulation using SPC primarily and has service dog, Phoenix. Spouse with her 24/7. Pt/spouse endorse dizziness/near syncope with an unknown cause over past several weeks resulting in frequent LOB/falling 2/2 to that.         OT Problem List: Decreased strength;Decreased knowledge of use of DME or AE;Decreased activity tolerance;Impaired balance (sitting and/or standing)      OT Treatment/Interventions: Self-care/ADL training;Balance training;Therapeutic exercise;Visual/perceptual remediation/compensation;DME and/or AE instruction;Patient/family education    OT Goals(Current goals can be found in the care plan section) Acute Rehab OT Goals Patient Stated Goal: to return home with improved safety and independence OT Goal Formulation: With patient/family Time For Goal Achievement: 10/27/17 Potential to Achieve Goals: Good ADL Goals Pt Will Perform Lower Body Dressing: with supervision;sit to/from stand(w/ or w/out AE, no LOB w/ transitional mvmt, LRAD for amb) Pt Will Transfer to Toilet: with supervision;ambulating;regular height toilet(LRAD for amb, no LOB) Additional ADL Goal #1: Pt will verbalize plan to implement at least 1 learned  energy conservation strategy to minimize falls, maximize safety, and maximize independence.  OT Frequency: Min 1X/week   Barriers to D/C:            Co-evaluation              AM-PAC PT "6 Clicks" Daily Activity     Outcome Measure Help from another person eating meals?: None Help from another person taking  care of personal grooming?: None Help from another person toileting, which includes using toliet, bedpan, or urinal?: A Lot Help from another person bathing (including washing, rinsing, drying)?: A Little Help from another person to put on and taking off regular upper body clothing?: A Little Help from another person to put on and taking off regular lower body clothing?: A Little 6 Click Score: 19   End of Session Equipment Utilized During Treatment: Gait belt;Rolling walker Nurse Communication: Other (comment)(need for yellow socks, chair alarm, pt requesting meds for upset stomach)  Activity Tolerance: Patient tolerated treatment well Patient left: in chair;with call bell/phone within reach;with family/visitor present;Other (comment)(pt/spouse instructed to remain in chair (both verbalized understanding), RN notified of need for chair alarm, verbalized plan to place in chair promptly)  OT Visit Diagnosis: Unsteadiness on feet (R26.81);Repeated falls (R29.6);Low vision, both eyes (H54.2)                Time: 1610-9604 OT Time Calculation (min): 33 min Charges:  OT General Charges $OT Visit: 1 Visit OT Evaluation $OT Eval Low Complexity: 1 Low OT Treatments $Self Care/Home Management : 23-37 mins  Richrd Prime, MPH, MS, OTR/L ascom [phone] 10/13/17, 4:35 PM
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
## 2017-10-13 NOTE — Progress Notes (Signed)
Hypoglycemic Event  CBG: 66  Treatment:4OZ OJ      Symptoms:None   Follow-up CBG: Time: 0805 CBG Result:74  Possible Reasons for Event:   Comments/MD notified:Dr Mody notified. No new orders.    Jaime [name]

## 2017-10-14 ENCOUNTER — Telehealth: Payer: Self-pay

## 2017-10-14 DIAGNOSIS — N390 Urinary tract infection, site not specified: Secondary | ICD-10-CM | POA: Diagnosis not present

## 2017-10-14 DIAGNOSIS — R262 Difficulty in walking, not elsewhere classified: Secondary | ICD-10-CM | POA: Diagnosis not present

## 2017-10-14 LAB — CBC
HCT: 25.4 % — ABNORMAL LOW (ref 35.0–47.0)
HEMOGLOBIN: 8.5 g/dL — AB (ref 12.0–16.0)
MCH: 33.8 pg (ref 26.0–34.0)
MCHC: 33.6 g/dL (ref 32.0–36.0)
MCV: 100.4 fL — ABNORMAL HIGH (ref 80.0–100.0)
PLATELETS: 215 10*3/uL (ref 150–440)
RBC: 2.53 MIL/uL — ABNORMAL LOW (ref 3.80–5.20)
RDW: 27.6 % — AB (ref 11.5–14.5)
WBC: 6.9 10*3/uL (ref 3.6–11.0)

## 2017-10-14 LAB — GLUCOSE, CAPILLARY: GLUCOSE-CAPILLARY: 83 mg/dL (ref 70–99)

## 2017-10-14 LAB — URINE CULTURE

## 2017-10-14 MED ORDER — AMLODIPINE BESYLATE 5 MG PO TABS
2.5000 mg | ORAL_TABLET | Freq: Every day | ORAL | Status: DC
  Administered 2017-10-14: 09:00:00 2.5 mg via ORAL
  Filled 2017-10-14: qty 1

## 2017-10-14 MED ORDER — CEPHALEXIN 250 MG PO CAPS: 250.0000 mg | ORAL_CAPSULE | Freq: Three times a day (TID) | ORAL | 0 refills | Status: AC

## 2017-10-14 MED ORDER — CEPHALEXIN 250 MG PO CAPS
250.0000 mg | ORAL_CAPSULE | Freq: Three times a day (TID) | ORAL | Status: DC
  Administered 2017-10-14: 250 mg via ORAL
  Filled 2017-10-14 (×3): qty 1

## 2017-10-14 MED ORDER — AMLODIPINE BESYLATE 2.5 MG PO TABS: 2.5000 mg | ORAL_TABLET | Freq: Every day | ORAL | 0 refills | Status: DC

## 2017-10-14 NOTE — Telephone Encounter (Signed)
Pt has a HFU scheduled for 07/11. She is being discharged later today. Can you please do a TIC call? Thanks!

## 2017-10-14 NOTE — Telephone Encounter (Signed)
Unable to complete TCM until pt has been d/c.
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
## 2017-10-14 NOTE — Care Management Note (Signed)
Case Management Note  Patient Details  Name: Jaime Spittleatricia S Tibbett MRN: [id number] Date of Birth: 1927/12/28  Subjective/Objective:                  Admitted to [facility] with the diagnosis of near syncope. Lives with husband Chrissie NoaWilliam [phone]). Last seen Dr. Beryle FlockBacigalupo 09/27/17. Uses [facility] for prescriptions. Home health in the past. Can't remember name of agency. Husband will call back with name of agency, [facility] following knee replacements. Ho home oxygen. Rolling walker, cane, rollayor, and transport chair in the home. Takes care of all activities of daily living herself. Fell last Wednesday. Appetite too good. Husband will transport. Discharge to home today per Dr. Juliene PinaMody.  Action/Plan: Physical therapy evaluation completed. Recommending home with home health and physical therapy. Husband will call back with name of agency.   Expected Discharge Date:  10/14/17               Expected Discharge Plan:   yes  In-House Referral:     Discharge planning Services     Post Acute Care Choice:    Choice offered to:     DME Arranged:    DME Agency:     HH Arranged:    HH Agency:     Status of Service:     If discussed at [employer] Meetings, dates discussed:    Additional Comments:  Gwenette GreetBrenda S Konstantin Lehnen, RN MSN CCM Care Management [phone] 10/14/2017, 11:06 AM

## 2017-10-14 NOTE — Progress Notes (Signed)
Pt is being discharged home. Discharge papers given and explained to pt and spouse, both verbalized understanding. Meds and f/u appointments reviewed. RX given.

## 2017-10-16 NOTE — Care Management (Signed)
Received call back from Ms. Myer HaffYarbrough 10/15/17. After researching home health agencies [facility] like [facility] Message sent to Gypsy BalsamSarah, [facility] representative 10/16/17 Gwenette GreetBrenda S Merci Walthers RN MSN CCM Care Management [phone]

## 2017-10-17 ENCOUNTER — Telehealth: Payer: Self-pay

## 2017-10-17 NOTE — Telephone Encounter (Signed)
[name] Follow-up: Noted on the report the patient hadn't received discharge paperwork, didn't know who to call if there was a change in her condition, and if she had follow-up appointment.  I talked with Jaime [name] and asked if she had received an envelope with her After Visit Summary and she said had found it and noticed she had a follow-up a appointment on Thursday, July 11th as well as others.  She didn't realize she had a UTI but had taken all of her antibotics. I reminded her to call her PCP if she had a change in her condition and let her know there would be one more automated call and if she had concerns to let us know.

## 2017-10-17 NOTE — Care Management (Signed)
Late note entry 10/17/17- RNCM received notification that [facility] and [facility]. Visit planned within next 48 hours per [facility].

## 2017-10-20 ENCOUNTER — Telehealth: Payer: Self-pay | Admitting: Family Medicine

## 2017-10-20 ENCOUNTER — Telehealth: Payer: Self-pay

## 2017-10-20 ENCOUNTER — Inpatient Hospital Stay: Payer: Self-pay | Admitting: Family Medicine

## 2017-10-20 NOTE — Telephone Encounter (Signed)
Ok for verbal order. Thanks!  [name], Angela M, MD, MPH [facility] 10/20/2017 11:44 AM

## 2017-10-20 NOTE — Telephone Encounter (Signed)
Ok to give order? 

## 2017-10-20 NOTE — Telephone Encounter (Signed)
[name] Follow-up: Noted on the report that Jaime [name] had some other questions from the 2nd automated call.  She wanted to know the results of her CT scan and I advised her to talk with her PCP about those results and she said she would.  Still feels weak and but no other needs noted.

## 2017-10-20 NOTE — Telephone Encounter (Signed)
Left message advising Amy. 

## 2017-10-20 NOTE — Telephone Encounter (Signed)
Amy with [facility] is requesting verbal orders for in home nursing visits as follows:  Once a week for 1 week (1W1) Twice a week for 1 week (2W1) Once a week for 2 weeks (1W2)  Also is requesting verbal orders to obtain urine specimen for urinalysis due to recent UTI. Please advise. Thanks TNP

## 2017-10-24 ENCOUNTER — Telehealth: Payer: Self-pay | Admitting: Family Medicine

## 2017-10-24 ENCOUNTER — Ambulatory Visit: Payer: Medicare Other | Admitting: Family Medicine

## 2017-10-24 ENCOUNTER — Encounter: Payer: Self-pay | Admitting: Family Medicine

## 2017-10-24 VITALS — BP 150/78 | HR 101 | Temp 97.9°F | Resp 22

## 2017-10-24 DIAGNOSIS — R55 Syncope and collapse: Secondary | ICD-10-CM

## 2017-10-24 DIAGNOSIS — F22 Delusional disorders: Secondary | ICD-10-CM | POA: Diagnosis not present

## 2017-10-24 DIAGNOSIS — R296 Repeated falls: Secondary | ICD-10-CM | POA: Diagnosis not present
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
## 2017-10-24 NOTE — Telephone Encounter (Signed)
Ok to place orders

## 2017-10-24 NOTE — Telephone Encounter (Signed)
Yes. OK for verbal orders  Bacigalupo, Marzella SchleinAngela M, MD, MPH [facility] 10/24/2017 3:54 PM
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
## 2017-10-24 NOTE — Assessment & Plan Note (Signed)
Possibly 2/2 Myrbetriq use No LOC, + head injury that seems to be improving Continue to hold Myrbetriq
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
## 2017-10-24 NOTE — Telephone Encounter (Signed)
Shireen with [facility] is requesting verbal orders for in home OT as follows:  Twice a week for 4 weeks  Please advise. Thanks TNP

## 2017-10-25 NOTE — Telephone Encounter (Signed)
Elmer BalesEmily Parker with [facility] is requesting verbal orders for in home physical therapy as follows:  1. Twice a week for 3 weeks 2. Vestibular Eval 3. Canalith repositioning maneuvers if needed.  Please advise. Thanks TNP

## 2017-10-25 NOTE — Telephone Encounter (Signed)
[facility] called back and was informed.

## 2017-10-26 ENCOUNTER — Telehealth: Payer: Self-pay | Admitting: Family Medicine

## 2017-10-26 NOTE — Telephone Encounter (Signed)
Ok for verbals 

## 2017-10-26 NOTE — Telephone Encounter (Signed)
Yes ok to verbal  Bacigalupo, Marzella SchleinAngela M, MD, MPH [facility] 10/26/2017 11:49 AM

## 2017-10-26 NOTE — Telephone Encounter (Signed)
Misty with [facility] wants a get a verbal to collect a urine for a FU on a uti.  CB # [phone]  Thanks [name]

## 2017-10-26 NOTE — Telephone Encounter (Signed)
Yes  [name], Tung Pustejovsky M, MD, MPH [facility] 10/26/2017 11:49 AM

## 2017-10-26 NOTE — Telephone Encounter (Signed)
Jaime [name] advised. 

## 2017-10-26 NOTE — Telephone Encounter (Signed)
Misty advised.

## 2017-11-01 ENCOUNTER — Other Ambulatory Visit: Payer: Self-pay | Admitting: Family Medicine

## 2017-11-01 MED ORDER — AMOXICILLIN-POT CLAVULANATE 500-125 MG PO TABS: 500.0000 mg | ORAL_TABLET | Freq: Two times a day (BID) | ORAL | 0 refills | Status: AC

## 2017-11-03 ENCOUNTER — Ambulatory Visit (INDEPENDENT_AMBULATORY_CARE_PROVIDER_SITE_OTHER): Payer: Medicare Other

## 2017-11-03 VITALS — BP 146/56 | HR 98 | Temp 98.7°F | Ht 62.0 in | Wt 199.0 lb

## 2017-11-03 DIAGNOSIS — Z0000 Encounter for general adult medical examination without abnormal findings: Secondary | ICD-10-CM
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
## 2017-11-03 NOTE — Patient Instructions (Signed)
Jaime [name] , Thank you for taking time to come for your Medicare Wellness Visit. I appreciate your ongoing commitment to your health goals. Please review the following plan we discussed and let me know if I can assist you in the future.   Screening recommendations/referrals: Colonoscopy: N/A Mammogram: N/A Bone Density: Up to date Recommended yearly ophthalmology/optometry visit for glaucoma screening and checkup Recommended yearly dental visit for hygiene and checkup  Vaccinations: Influenza vaccine: Up to date Pneumococcal vaccine: Up to date Tdap vaccine: Pt declines today.  Shingles vaccine: Pt declines today.     Advanced directives: Please bring a copy of your POA (Power of Attorney) and/or Living Will to your next appointment.   Conditions/risks identified: Fall risk prevention; Obesity- recommend decreasing amount of sugar/desserts consumed a day and substituting with a fruit.   Next appointment: 11/17/17 @ 10 AM with Dr Beryle FlockBacigalupo.   Preventive Care 7265 Years and Older, Female Preventive care refers to lifestyle choices and visits with your health care provider that can promote health and wellness. What does preventive care include?  A yearly physical exam. This is also called an annual well check.  Dental exams once or twice a year.  Routine eye exams. Ask your health care provider how often you should have your eyes checked.  Personal lifestyle choices, including:  Daily care of your teeth and gums.  Regular physical activity.  Eating a healthy diet.  Avoiding tobacco and drug use.  Limiting alcohol use.  Practicing safe sex.  Taking low-dose aspirin every day.  Taking vitamin and mineral supplements as recommended by your health care provider. What happens during an annual well check? The services and screenings done by your health care provider during your annual well check will depend on your age, overall health, lifestyle risk factors, and family  history of disease. Counseling  Your health care provider may ask you questions about your:  Alcohol use.  Tobacco use.  Drug use.  Emotional well-being.  Home and relationship well-being.  Sexual activity.  Eating habits.  History of falls.  Memory and ability to understand (cognition).  Work and work Astronomerenvironment.  Reproductive health. Screening  You may have the following tests or measurements:  Height, weight, and BMI.  Blood pressure.  Lipid and cholesterol levels. These may be checked every 5 years, or more frequently if you are over 82 years old.  Skin check.  Lung cancer screening. You may have this screening every year starting at age 82 if you have a 30-pack-year history of smoking and currently smoke or have quit within the past 15 years.  Fecal occult blood test (FOBT) of the stool. You may have this test every year starting at age 82.  Flexible sigmoidoscopy or colonoscopy. You may have a sigmoidoscopy every 5 years or a colonoscopy every 10 years starting at age 82.  Hepatitis C blood test.  Hepatitis B blood test.  Sexually transmitted disease (STD) testing.  Diabetes screening. This is done by checking your blood sugar (glucose) after you have not eaten for a while (fasting). You may have this done every 1-3 years.  Bone density scan. This is done to screen for osteoporosis. You may have this done starting at age 82.  Mammogram. This may be done every 1-2 years. Talk to your health care provider about how often you should have regular mammograms. Talk with your health care provider about your test results, treatment options, and if necessary, the need for more tests. Vaccines  Your  health care provider may recommend certain vaccines, such as:  Influenza vaccine. This is recommended every year.  Tetanus, diphtheria, and acellular pertussis (Tdap, Td) vaccine. You may need a Td booster every 10 years.  Zoster vaccine. You may need this after  age 70.  Pneumococcal 13-valent conjugate (PCV13) vaccine. One dose is recommended after age 30.  Pneumococcal polysaccharide (PPSV23) vaccine. One dose is recommended after age 16. Talk to your health care provider about which screenings and vaccines you need and how often you need them. This information is not intended to replace advice given to you by your health care provider. Make sure you discuss any questions you have with your health care provider. Document Released: 04/25/2015 Document Revised: 12/17/2015 Document Reviewed: 01/28/2015 Elsevier Interactive Patient Education  2017 [place] Prevention in the Home Falls can cause injuries. They can happen to people of all ages. There are many things you can do to make your home safe and to help prevent falls. What can I do on the outside of my home?  Regularly fix the edges of walkways and driveways and fix any cracks.  Remove anything that might make you trip as you walk through a door, such as a raised step or threshold.  Trim any bushes or trees on the path to your home.  Use bright outdoor lighting.  Clear any walking paths of anything that might make someone trip, such as rocks or tools.  Regularly check to see if handrails are loose or broken. Make sure that both sides of any steps have handrails.  Any raised decks and porches should have guardrails on the edges.  Have any leaves, snow, or ice cleared regularly.  Use sand or salt on walking paths during winter.  Clean up any spills in your garage right away. This includes oil or grease spills. What can I do in the bathroom?  Use night lights.  Install grab bars by the toilet and in the tub and shower. Do not use towel bars as grab bars.  Use non-skid mats or decals in the tub or shower.  If you need to sit down in the shower, use a plastic, non-slip stool.  Keep the floor dry. Clean up any water that spills on the floor as soon as it  happens.  Remove soap buildup in the tub or shower regularly.  Attach bath mats securely with double-sided non-slip rug tape.  Do not have throw rugs and other things on the floor that can make you trip. What can I do in the bedroom?  Use night lights.  Make sure that you have a light by your bed that is easy to reach.  Do not use any sheets or blankets that are too big for your bed. They should not hang down onto the floor.  Have a firm chair that has side arms. You can use this for support while you get dressed.  Do not have throw rugs and other things on the floor that can make you trip. What can I do in the kitchen?  Clean up any spills right away.  Avoid walking on wet floors.  Keep items that you use a lot in easy-to-reach places.  If you need to reach something above you, use a strong step stool that has a grab bar.  Keep electrical cords out of the way.  Do not use floor polish or wax that makes floors slippery. If you must use wax, use non-skid floor wax.  Do not  have throw rugs and other things on the floor that can make you trip. What can I do with my stairs?  Do not leave any items on the stairs.  Make sure that there are handrails on both sides of the stairs and use them. Fix handrails that are broken or loose. Make sure that handrails are as long as the stairways.  Check any carpeting to make sure that it is firmly attached to the stairs. Fix any carpet that is loose or worn.  Avoid having throw rugs at the top or bottom of the stairs. If you do have throw rugs, attach them to the floor with carpet tape.  Make sure that you have a light switch at the top of the stairs and the bottom of the stairs. If you do not have them, ask someone to add them for you. What else can I do to help prevent falls?  Wear shoes that:  Do not have high heels.  Have rubber bottoms.  Are comfortable and fit you well.  Are closed at the toe. Do not wear sandals.  If you  use a stepladder:  Make sure that it is fully opened. Do not climb a closed stepladder.  Make sure that both sides of the stepladder are locked into place.  Ask someone to hold it for you, if possible.  Clearly mark and make sure that you can see:  Any grab bars or handrails.  First and last steps.  Where the edge of each step is.  Use tools that help you move around (mobility aids) if they are needed. These include:  Canes.  Walkers.  Scooters.  Crutches.  Turn on the lights when you go into a dark area. Replace any light bulbs as soon as they burn out.  Set up your furniture so you have a clear path. Avoid moving your furniture around.  If any of your floors are uneven, fix them.  If there are any pets around you, be aware of where they are.  Review your medicines with your doctor. Some medicines can make you feel dizzy. This can increase your chance of falling. Ask your doctor what other things that you can do to help prevent falls. This information is not intended to replace advice given to you by your health care provider. Make sure you discuss any questions you have with your health care provider. Document Released: 01/23/2009 Document Revised: 09/04/2015 Document Reviewed: 05/03/2014 Elsevier Interactive Patient Education  2017 [employer].

## 2017-11-08 ENCOUNTER — Telehealth: Payer: Self-pay | Admitting: Family Medicine
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
## 2017-11-08 NOTE — Telephone Encounter (Signed)
Ok for verbal  Beryle FlockBacigalupo, Marzella SchleinAngela M, MD, MPH [facility] 11/08/2017 12:07 PM

## 2017-11-08 NOTE — Telephone Encounter (Signed)
Shireen with [facility] called saying they need to get a verbal order for Social work to evaluate patient and for a nurse to do another UA culture on patient    Shireen's call back is [phone]  Thanks Fortune Brandsteri

## 2017-11-08 NOTE — Telephone Encounter (Signed)
OK for verbals 

## 2017-11-09 NOTE — Telephone Encounter (Signed)
As Irving Burtonmily stated, we are aware.  [name], Angela M, MD, MPH [facility] 11/09/2017 9:00 AM

## 2017-11-10 ENCOUNTER — Telehealth: Payer: Self-pay | Admitting: Family Medicine

## 2017-11-10 ENCOUNTER — Encounter: Payer: Self-pay | Admitting: Family Medicine

## 2017-11-10 NOTE — Telephone Encounter (Signed)
Emily advised. 

## 2017-11-10 NOTE — Telephone Encounter (Signed)
OK to give verbal orders 

## 2017-11-10 NOTE — Telephone Encounter (Signed)
Irving BurtonEmily Physical Therapist with [facility] is requesting verbal orders to extend orders for in home physical therapy as follows:  Twice a week for 2 weeks  Please advise. Thanks TNP

## 2017-11-10 NOTE — Telephone Encounter (Signed)
Yes. Thanks!  [name], Jacksyn Beeks M, MD, MPH [facility] 11/10/2017 10:33 AM

## 2017-11-17 ENCOUNTER — Encounter: Payer: Self-pay | Admitting: Family Medicine

## 2017-11-17 ENCOUNTER — Ambulatory Visit (INDEPENDENT_AMBULATORY_CARE_PROVIDER_SITE_OTHER): Payer: Medicare Other | Admitting: Family Medicine

## 2017-11-17 VITALS — BP 130/72 | HR 77 | Temp 97.6°F | Resp 24 | Ht 62.0 in | Wt 199.0 lb

## 2017-11-17 DIAGNOSIS — D649 Anemia, unspecified: Secondary | ICD-10-CM | POA: Diagnosis not present

## 2017-11-17 DIAGNOSIS — H547 Unspecified visual loss: Secondary | ICD-10-CM

## 2017-11-17 DIAGNOSIS — Z0000 Encounter for general adult medical examination without abnormal findings: Secondary | ICD-10-CM | POA: Diagnosis not present

## 2017-11-17 DIAGNOSIS — F22 Delusional disorders: Secondary | ICD-10-CM | POA: Diagnosis not present

## 2017-11-17 DIAGNOSIS — Z13220 Encounter for screening for lipoid disorders: Secondary | ICD-10-CM

## 2017-11-17 DIAGNOSIS — R296 Repeated falls: Secondary | ICD-10-CM

## 2017-11-17 MED ORDER — ALPRAZOLAM 0.25 MG PO TABS: 0.2500 mg | ORAL_TABLET | Freq: Every day | ORAL | 2 refills | Status: DC | PRN

## 2017-11-17 NOTE — Patient Instructions (Signed)
Preventive Care 65 Years and Older, Female Preventive care refers to lifestyle choices and visits with your health care provider that can promote health and wellness. What does preventive care include?  A yearly physical exam. This is also called an annual well check.  Dental exams once or twice a year.  Routine eye exams. Ask your health care provider how often you should have your eyes checked.  Personal lifestyle choices, including: ? Daily care of your teeth and gums. ? Regular physical activity. ? Eating a healthy diet. ? Avoiding tobacco and drug use. ? Limiting alcohol use. ? Practicing safe sex. ? Taking low-dose aspirin every day. ? Taking vitamin and mineral supplements as recommended by your health care provider. What happens during an annual well check? The services and screenings done by your health care provider during your annual well check will depend on your age, overall health, lifestyle risk factors, and family history of disease. Counseling Your health care provider may ask you questions about your:  Alcohol use.  Tobacco use.  Drug use.  Emotional well-being.  Home and relationship well-being.  Sexual activity.  Eating habits.  History of falls.  Memory and ability to understand (cognition).  Work and work environment.  Reproductive health.  Screening You may have the following tests or measurements:  Height, weight, and BMI.  Blood pressure.  Lipid and cholesterol levels. These may be checked every 5 years, or more frequently if you are over 50 years old.  Skin check.  Lung cancer screening. You may have this screening every year starting at age 55 if you have a 30-pack-year history of smoking and currently smoke or have quit within the past 15 years.  Fecal occult blood test (FOBT) of the stool. You may have this test every year starting at age 50.  Flexible sigmoidoscopy or colonoscopy. You may have a sigmoidoscopy every 5 years or  a colonoscopy every 10 years starting at age 50.  Hepatitis C blood test.  Hepatitis B blood test.  Sexually transmitted disease (STD) testing.  Diabetes screening. This is done by checking your blood sugar (glucose) after you have not eaten for a while (fasting). You may have this done every 1-3 years.  Bone density scan. This is done to screen for osteoporosis. You may have this done starting at age 65.  Mammogram. This may be done every 1-2 years. Talk to your health care provider about how often you should have regular mammograms.  Talk with your health care provider about your test results, treatment options, and if necessary, the need for more tests. Vaccines Your health care provider may recommend certain vaccines, such as:  Influenza vaccine. This is recommended every year.  Tetanus, diphtheria, and acellular pertussis (Tdap, Td) vaccine. You may need a Td booster every 10 years.  Varicella vaccine. You may need this if you have not been vaccinated.  Zoster vaccine. You may need this after age 60.  Measles, mumps, and rubella (MMR) vaccine. You may need at least one dose of MMR if you were born in 1957 or later. You may also need a second dose.  Pneumococcal 13-valent conjugate (PCV13) vaccine. One dose is recommended after age 65.  Pneumococcal polysaccharide (PPSV23) vaccine. One dose is recommended after age 65.  Meningococcal vaccine. You may need this if you have certain conditions.  Hepatitis A vaccine. You may need this if you have certain conditions or if you travel or work in places where you may be exposed to hepatitis   A.  Hepatitis B vaccine. You may need this if you have certain conditions or if you travel or work in places where you may be exposed to hepatitis B.  Haemophilus influenzae type b (Hib) vaccine. You may need this if you have certain conditions.  Talk to your health care provider about which screenings and vaccines you need and how often you  need them. This information is not intended to replace advice given to you by your health care provider. Make sure you discuss any questions you have with your health care provider. Document Released: 04/25/2015 Document Revised: 12/17/2015 Document Reviewed: 01/28/2015 Elsevier Interactive Patient Education  2018 [employer]  
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
## 2017-11-21 ENCOUNTER — Telehealth: Payer: Self-pay | Admitting: Family Medicine

## 2017-11-21 DIAGNOSIS — T50904D Poisoning by unspecified drugs, medicaments and biological substances, undetermined, subsequent encounter: Secondary | ICD-10-CM

## 2017-11-21 DIAGNOSIS — T50901S Poisoning by unspecified drugs, medicaments and biological substances, accidental (unintentional), sequela: Secondary | ICD-10-CM

## 2017-11-21 NOTE — Telephone Encounter (Signed)
lmtcb

## 2017-11-21 NOTE — Telephone Encounter (Signed)
Pt wants Dr. B to call her back.  She said about her "bloodwork"  She would not give me anymore info when I asked  Pt's CB # [phone]  Thanks [name]
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
## 2017-11-21 NOTE — Telephone Encounter (Signed)
Ordered more labs including urine drug screen.  She can have these done with her other lab work.  [name], Angela M, MD, MPH [facility] 11/21/2017 4:01 PM

## 2017-11-21 NOTE — Telephone Encounter (Signed)
Pt advised these have been ordered, and she will need to collect a UA.

## 2017-11-25 LAB — HEAVY METALS PROFILE II, BLOOD
ARSENIC: 5 ug/L (ref 2–23)
Cadmium: NOT DETECTED ug/L (ref 0.0–1.2)
Lead, Blood: 1 ug/dL (ref 0–4)
MERCURY: NOT DETECTED ug/L (ref 0.0–14.9)

## 2017-11-25 LAB — ACETAMINOPHEN LEVEL: ACETAMINOPHEN (TYLENOL), SERUM: NOT DETECTED ug/mL (ref 10–30)

## 2017-11-25 LAB — SALICYLATE LEVEL: SALICYLATE, SERUM: NOT DETECTED ug/mL (ref 30–250)

## 2017-11-28 ENCOUNTER — Telehealth: Payer: Self-pay

## 2017-11-28 NOTE — Telephone Encounter (Signed)
Pt advised of results. Advised her to come in to check other fasting labs.

## 2017-11-28 NOTE — Telephone Encounter (Signed)
-----   Message from Erasmo DownerAngela M Bacigalupo, MD sent at 11/28/2017 12:00 PM EDT ----- Heavy metal panel, acetaminophen and salicylate panel is negative.  Urine tox screen pending.  Please also asked patient to come in to get her regular screening labs including cholesterol as this was not gotten when she had her other labs drawn.  [name], Angela M, MD, MPH [facility] 11/28/2017 12:00 PM

## 2017-11-28 NOTE — Telephone Encounter (Signed)
LMTCB

## 2017-12-01 ENCOUNTER — Telehealth: Payer: Self-pay

## 2017-12-01 LAB — LIPID PANEL
CHOLESTEROL TOTAL: 149 mg/dL (ref 100–199)
Chol/HDL Ratio: 2.7 ratio (ref 0.0–4.4)
HDL: 55 mg/dL (ref >39)
LDL Calculated: 81 mg/dL (ref 0–99)
TRIGLYCERIDES: 64 mg/dL (ref 0–149)
VLDL Cholesterol Cal: 13 mg/dL (ref 5–40)

## 2017-12-01 LAB — CBC
HEMATOCRIT: 29 % — AB (ref 34.0–46.6)
HEMOGLOBIN: 9.2 g/dL — AB (ref 11.1–15.9)
MCH: 31.4 pg (ref 26.6–33.0)
MCHC: 31.7 g/dL (ref 31.5–35.7)
MCV: 99 fL — AB (ref 79–97)
Platelets: 302 10*3/uL (ref 150–450)
RBC: 2.93 x10E6/uL — AB (ref 3.77–5.28)
RDW: 27 % — ABNORMAL HIGH (ref 12.3–15.4)
WBC: 7.9 10*3/uL (ref 3.4–10.8)

## 2017-12-01 LAB — IRON,TIBC AND FERRITIN PANEL
FERRITIN: 119 ng/mL (ref 15–150)
IRON: 99 ug/dL (ref 27–139)
Iron Saturation: 34 % (ref 15–55)
Total Iron Binding Capacity: 292 ug/dL (ref 250–450)
UIBC: 193 ug/dL (ref 118–369)

## 2017-12-01 LAB — TOXASSURE SELECT 12(MW)

## 2017-12-01 NOTE — Telephone Encounter (Signed)
Pt advised.

## 2017-12-01 NOTE — Telephone Encounter (Signed)
Labs added on and pt advised.

## 2017-12-01 NOTE — Telephone Encounter (Signed)
-----   Message from Erasmo DownerAngela M Bacigalupo, MD sent at 12/01/2017  8:23 AM EDT ----- Normal cholesterol and iron levels.  Continues to have anemia that is around baseline.  Can we add on folate and Vit B12 levels for the anemia to see if a deficiency is causing the anemia?  [name], Angela M, MD, MPH [facility] 12/01/2017 8:23 AM

## 2017-12-01 NOTE — Telephone Encounter (Signed)
-----   Message from Erasmo DownerAngela M Bacigalupo, MD sent at 12/01/2017  2:22 PM EDT ----- Negative drug screen  Beryle FlockBacigalupo, Marzella SchleinAngela M, MD, MPH [facility] 12/01/2017 2:22 PM

## 2017-12-05 ENCOUNTER — Telehealth: Payer: Self-pay

## 2017-12-05 NOTE — Telephone Encounter (Signed)
Patient advised. She declined hematology referral at this time.

## 2017-12-05 NOTE — Telephone Encounter (Signed)
Ok. Will discuss at next visit.  [name], Sanjeev Main M, MD, MPH [facility] 12/05/2017 2:41 PM

## 2017-12-05 NOTE — Telephone Encounter (Signed)
-----   Message from Erasmo DownerAngela M Bacigalupo, MD sent at 12/05/2017  9:53 AM EDT ----- Folate and vitamin B12 levels are not low.  This is not the cause of patient's anemia.  I do recommend that she see hematology for further work-up of this anemia as no specific cause has been found.  I will will place the referral if patient agrees.  [name], Angela M, MD, MPH [facility] 12/05/2017 9:53 AM

## 2017-12-06 LAB — B12 AND FOLATE PANEL
FOLATE: 5.2 ng/mL (ref >3.0)
VITAMIN B 12: 1556 pg/mL — AB (ref 232–1245)

## 2017-12-06 LAB — SPECIMEN STATUS REPORT

## 2018-01-16 ENCOUNTER — Other Ambulatory Visit: Payer: Self-pay

## 2018-01-16 ENCOUNTER — Emergency Department
Admission: EM | Admit: 2018-01-16 | Discharge: 2018-01-16 | Disposition: A | Discharge status: Home / Self Care | Payer: Medicare Other | Attending: Emergency Medicine | Admitting: Emergency Medicine

## 2018-01-16 ENCOUNTER — Encounter: Payer: Self-pay | Admitting: Emergency Medicine

## 2018-01-16 ENCOUNTER — Emergency Department: Payer: Medicare Other

## 2018-01-16 DIAGNOSIS — F22 Delusional disorders: Secondary | ICD-10-CM | POA: Diagnosis not present

## 2018-01-16 DIAGNOSIS — R51 Headache: Secondary | ICD-10-CM | POA: Diagnosis present

## 2018-01-16 DIAGNOSIS — Z79899 Other long term (current) drug therapy: Secondary | ICD-10-CM | POA: Diagnosis not present

## 2018-01-16 DIAGNOSIS — Z87891 Personal history of nicotine dependence: Secondary | ICD-10-CM | POA: Diagnosis not present

## 2018-01-16 DIAGNOSIS — R519 Headache, unspecified: Secondary | ICD-10-CM

## 2018-01-16 DIAGNOSIS — I10 Essential (primary) hypertension: Secondary | ICD-10-CM | POA: Insufficient documentation

## 2018-01-16 DIAGNOSIS — Z96651 Presence of right artificial knee joint: Secondary | ICD-10-CM | POA: Insufficient documentation

## 2018-01-16 LAB — COMPREHENSIVE METABOLIC PANEL
ALK PHOS: 43 U/L (ref 38–126)
ALT: 10 U/L (ref 0–44)
AST: 19 U/L (ref 15–41)
Albumin: 3.9 g/dL (ref 3.5–5.0)
Anion gap: 5 (ref 5–15)
BILIRUBIN TOTAL: 0.9 mg/dL (ref 0.3–1.2)
BUN: 14 mg/dL (ref 8–23)
CALCIUM: 9 mg/dL (ref 8.9–10.3)
CO2: 28 mmol/L (ref 22–32)
Chloride: 106 mmol/L (ref 98–111)
Creatinine, Ser: 0.64 mg/dL (ref 0.44–1.00)
Glucose, Bld: 123 mg/dL — ABNORMAL HIGH (ref 70–99)
Potassium: 3.8 mmol/L (ref 3.5–5.1)
Sodium: 139 mmol/L (ref 135–145)
TOTAL PROTEIN: 7 g/dL (ref 6.5–8.1)

## 2018-01-16 LAB — CBC
HCT: 27.8 % — ABNORMAL LOW (ref 35.0–47.0)
Hemoglobin: 9.2 g/dL — ABNORMAL LOW (ref 12.0–16.0)
MCH: 33 pg (ref 26.0–34.0)
MCHC: 33 g/dL (ref 32.0–36.0)
MCV: 99.9 fL (ref 80.0–100.0)
Platelets: 243 10*3/uL (ref 150–440)
RBC: 2.79 MIL/uL — ABNORMAL LOW (ref 3.80–5.20)
RDW: 26.9 % — AB (ref 11.5–14.5)
WBC: 9.6 10*3/uL (ref 3.6–11.0)

## 2018-01-16 LAB — URINALYSIS, COMPLETE (UACMP) WITH MICROSCOPIC
BILIRUBIN URINE: NEGATIVE
Bacteria, UA: NONE SEEN
GLUCOSE, UA: NEGATIVE mg/dL
Hgb urine dipstick: NEGATIVE
KETONES UR: NEGATIVE mg/dL
LEUKOCYTES UA: NEGATIVE
NITRITE: NEGATIVE
PH: 7 (ref 5.0–8.0)
Protein, ur: NEGATIVE mg/dL
SPECIFIC GRAVITY, URINE: 1.004 — AB (ref 1.005–1.030)

## 2018-01-16 LAB — URINE DRUG SCREEN, QUALITATIVE (ARMC ONLY)
Amphetamines, Ur Screen: NOT DETECTED
BENZODIAZEPINE, UR SCRN: NOT DETECTED
Barbiturates, Ur Screen: NOT DETECTED
CANNABINOID 50 NG, UR ~~LOC~~: NOT DETECTED
Cocaine Metabolite,Ur ~~LOC~~: NOT DETECTED
MDMA (Ecstasy)Ur Screen: NOT DETECTED
METHADONE SCREEN, URINE: NOT DETECTED
Opiate, Ur Screen: NOT DETECTED
Phencyclidine (PCP) Ur S: NOT DETECTED
TRICYCLIC, UR SCREEN: NOT DETECTED

## 2018-01-16 LAB — GLUCOSE, CAPILLARY: Glucose-Capillary: 89 mg/dL (ref 70–99)

## 2018-01-16 LAB — ACETAMINOPHEN LEVEL

## 2018-01-16 LAB — SALICYLATE LEVEL

## 2018-01-16 MED ORDER — KETOROLAC TROMETHAMINE 30 MG/ML IJ SOLN
15.0000 mg | Freq: Once | INTRAMUSCULAR | Status: AC
  Administered 2018-01-16: 15 mg via INTRAVENOUS
  Filled 2018-01-16: qty 1

## 2018-01-16 MED ORDER — SODIUM CHLORIDE 0.9 % IV BOLUS
1000.0000 mL | Freq: Once | INTRAVENOUS | Status: AC
  Administered 2018-01-16: 1000 mL via INTRAVENOUS
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
## 2018-01-16 NOTE — ED Notes (Signed)
Walked Pt with walker. Pt did very well had no problems.

## 2018-01-16 NOTE — ED Triage Notes (Signed)
Pt reports she had a real bad headache and she was getting hot flashes and having trouble walking. Pt reports the sx's started a few days ago but got worse yesterday. Pt reports no hx of HTN and took muccinex and ibuprofen fof the headache.

## 2018-01-19 ENCOUNTER — Ambulatory Visit: Payer: Medicare Other | Admitting: Family Medicine

## 2018-01-19 ENCOUNTER — Encounter: Payer: Self-pay | Admitting: Family Medicine

## 2018-01-19 ENCOUNTER — Ambulatory Visit
Admission: RE | Admit: 2018-01-19 | Discharge: 2018-01-19 | Disposition: A | Discharge status: Home / Self Care | Payer: Medicare Other | Source: Ambulatory Visit | Attending: Family Medicine | Admitting: Family Medicine

## 2018-01-19 VITALS — BP 132/82 | HR 87 | Temp 97.9°F

## 2018-01-19 DIAGNOSIS — R062 Wheezing: Secondary | ICD-10-CM

## 2018-01-19 DIAGNOSIS — K449 Diaphragmatic hernia without obstruction or gangrene: Secondary | ICD-10-CM | POA: Diagnosis not present

## 2018-01-19 DIAGNOSIS — I890 Lymphedema, not elsewhere classified: Secondary | ICD-10-CM | POA: Diagnosis not present

## 2018-01-19 DIAGNOSIS — F22 Delusional disorders: Secondary | ICD-10-CM

## 2018-01-19 DIAGNOSIS — I517 Cardiomegaly: Secondary | ICD-10-CM | POA: Diagnosis not present

## 2018-01-19 NOTE — Patient Instructions (Signed)
Shortness of Breath, Adult  Shortness of breath means you have trouble breathing. Your lungs are organs for breathing.  Follow these instructions at home:  Pay attention to any changes in your symptoms. Take these actions to help with your condition:  ? Do not smoke. Smoking can cause shortness of breath. If you need help to quit smoking, ask your doctor.  ? Avoid things that can make it harder to breathe, such as:  ? Mold.  ? Dust.  ? Air pollution.  ? Chemical smells.  ? Things that can cause allergy symptoms (allergens), if you have allergies.  ? Keep your living space clean and free of mold and dust.  ? Rest as needed. Slowly return to your usual activities.  ? Take over-the-counter and prescription medicines, including oxygen and inhaled medicines, only as told by your doctor.  ? Keep all follow-up visits as told by your doctor. This is important.  Contact a doctor if:  ? Your condition does not get better as soon as expected.  ? You have a hard time doing your normal activities, even after you rest.  ? You have new symptoms.  Get help right away if:  ? You have trouble breathing when you are resting.  ? You feel light-headed or you faint.  ? You have a cough that is not helped by medicines.  ? You cough up blood.  ? You have pain with breathing.  ? You have pain in your chest, arms, shoulders, or belly (abdomen).  ? You have a fever.  ? You cannot walk up stairs.  ? You cannot exercise the way you normally do.  This information is not intended to replace advice given to you by your health care provider. Make sure you discuss any questions you have with your health care provider.  Document Released: 09/15/2007 Document Revised: 04/15/2016 Document Reviewed: 04/15/2016  Elsevier Interactive Patient Education ? 2017 Elsevier Inc[employer]
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
## 2018-01-20 ENCOUNTER — Telehealth: Payer: Self-pay

## 2018-01-20 NOTE — Assessment & Plan Note (Signed)
Chronic and stable Followed by vascular surgery Continue compression stockings Encouraged to continue lymphedema pumps Discussed that these are not causing her weakness or falls
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
## 2018-01-20 NOTE — Telephone Encounter (Signed)
[facility]  ED   ----- Message from Erasmo Downer, MD sent at 01/20/2018  9:48 AM EDT ----- No apparent pneumonia or bronchitis  Bacigalupo, Marzella Schlein, MD, MPH [facility] 01/20/2018 9:48 AM

## 2018-01-20 NOTE — Telephone Encounter (Signed)
-----   Message from Erasmo Downer, MD sent at 01/20/2018  9:48 AM EDT ----- No apparent pneumonia or bronchitis  Bacigalupo, Marzella Schlein, MD, MPH [facility] 01/20/2018 9:48 AM

## 2018-01-20 NOTE — Telephone Encounter (Signed)
Advised 

## 2018-01-20 NOTE — Assessment & Plan Note (Signed)
New problem Has cough at baseline Has a history of bronchitis Seems to be doing quite well after DuoNeb She does not believe that she is sick and believes this is just exercise-induced asthma We will obtain chest x-ray to ensure she does not have any occult pneumonia Encouraged albuterol use as needed

## 2018-02-17 ENCOUNTER — Emergency Department: Payer: Medicare Other

## 2018-02-17 ENCOUNTER — Encounter: Payer: Self-pay | Admitting: Radiology

## 2018-02-17 ENCOUNTER — Other Ambulatory Visit: Payer: Self-pay

## 2018-02-17 ENCOUNTER — Inpatient Hospital Stay
Admission: EM | Admit: 2018-02-17 | Discharge: 2018-02-26 | DRG: 542 | Disposition: A | Discharge status: Home / Self Care | Payer: Medicare Other | Attending: Internal Medicine | Admitting: Internal Medicine

## 2018-02-17 DIAGNOSIS — M545 Low back pain, unspecified: Secondary | ICD-10-CM

## 2018-02-17 DIAGNOSIS — Z85118 Personal history of other malignant neoplasm of bronchus and lung: Secondary | ICD-10-CM

## 2018-02-17 DIAGNOSIS — H9190 Unspecified hearing loss, unspecified ear: Secondary | ICD-10-CM | POA: Diagnosis present

## 2018-02-17 DIAGNOSIS — F0390 Unspecified dementia without behavioral disturbance: Secondary | ICD-10-CM | POA: Diagnosis present

## 2018-02-17 DIAGNOSIS — T380X5A Adverse effect of glucocorticoids and synthetic analogues, initial encounter: Secondary | ICD-10-CM | POA: Diagnosis present

## 2018-02-17 DIAGNOSIS — F600 Paranoid personality disorder: Secondary | ICD-10-CM | POA: Diagnosis present

## 2018-02-17 DIAGNOSIS — Z1810 Retained metal fragments, unspecified: Secondary | ICD-10-CM

## 2018-02-17 DIAGNOSIS — Z885 Allergy status to narcotic agent status: Secondary | ICD-10-CM

## 2018-02-17 DIAGNOSIS — Z8614 Personal history of Methicillin resistant Staphylococcus aureus infection: Secondary | ICD-10-CM

## 2018-02-17 DIAGNOSIS — H548 Legal blindness, as defined in USA: Secondary | ICD-10-CM | POA: Diagnosis present

## 2018-02-17 DIAGNOSIS — M4856XA Collapsed vertebra, not elsewhere classified, lumbar region, initial encounter for fracture: Secondary | ICD-10-CM | POA: Diagnosis not present

## 2018-02-17 DIAGNOSIS — J9601 Acute respiratory failure with hypoxia: Secondary | ICD-10-CM | POA: Diagnosis present

## 2018-02-17 DIAGNOSIS — M5137 Other intervertebral disc degeneration, lumbosacral region: Secondary | ICD-10-CM | POA: Diagnosis present

## 2018-02-17 DIAGNOSIS — J9690 Respiratory failure, unspecified, unspecified whether with hypoxia or hypercapnia: Secondary | ICD-10-CM | POA: Diagnosis present

## 2018-02-17 DIAGNOSIS — F329 Major depressive disorder, single episode, unspecified: Secondary | ICD-10-CM | POA: Diagnosis present

## 2018-02-17 DIAGNOSIS — E1165 Type 2 diabetes mellitus with hyperglycemia: Secondary | ICD-10-CM | POA: Diagnosis present

## 2018-02-17 DIAGNOSIS — M5136 Other intervertebral disc degeneration, lumbar region: Secondary | ICD-10-CM | POA: Diagnosis present

## 2018-02-17 DIAGNOSIS — J810 Acute pulmonary edema: Secondary | ICD-10-CM | POA: Diagnosis present

## 2018-02-17 DIAGNOSIS — Z66 Do not resuscitate: Secondary | ICD-10-CM | POA: Diagnosis present

## 2018-02-17 DIAGNOSIS — F22 Delusional disorders: Secondary | ICD-10-CM | POA: Diagnosis present

## 2018-02-17 DIAGNOSIS — Z7189 Other specified counseling: Secondary | ICD-10-CM | POA: Diagnosis not present

## 2018-02-17 DIAGNOSIS — Z452 Encounter for adjustment and management of vascular access device: Secondary | ICD-10-CM

## 2018-02-17 DIAGNOSIS — I5033 Acute on chronic diastolic (congestive) heart failure: Secondary | ICD-10-CM

## 2018-02-17 DIAGNOSIS — J441 Chronic obstructive pulmonary disease with (acute) exacerbation: Secondary | ICD-10-CM | POA: Diagnosis present

## 2018-02-17 DIAGNOSIS — F333 Major depressive disorder, recurrent, severe with psychotic symptoms: Secondary | ICD-10-CM

## 2018-02-17 DIAGNOSIS — R0602 Shortness of breath: Secondary | ICD-10-CM

## 2018-02-17 DIAGNOSIS — K5900 Constipation, unspecified: Secondary | ICD-10-CM | POA: Diagnosis present

## 2018-02-17 DIAGNOSIS — K449 Diaphragmatic hernia without obstruction or gangrene: Secondary | ICD-10-CM | POA: Diagnosis present

## 2018-02-17 DIAGNOSIS — Z515 Encounter for palliative care: Secondary | ICD-10-CM

## 2018-02-17 DIAGNOSIS — Z881 Allergy status to other antibiotic agents status: Secondary | ICD-10-CM

## 2018-02-17 DIAGNOSIS — I4510 Unspecified right bundle-branch block: Secondary | ICD-10-CM | POA: Diagnosis present

## 2018-02-17 DIAGNOSIS — M1990 Unspecified osteoarthritis, unspecified site: Secondary | ICD-10-CM | POA: Diagnosis present

## 2018-02-17 DIAGNOSIS — G8929 Other chronic pain: Secondary | ICD-10-CM

## 2018-02-17 DIAGNOSIS — D649 Anemia, unspecified: Secondary | ICD-10-CM | POA: Diagnosis present

## 2018-02-17 DIAGNOSIS — M549 Dorsalgia, unspecified: Secondary | ICD-10-CM | POA: Diagnosis present

## 2018-02-17 DIAGNOSIS — I4819 Other persistent atrial fibrillation: Secondary | ICD-10-CM

## 2018-02-17 DIAGNOSIS — M4690 Unspecified inflammatory spondylopathy, site unspecified: Secondary | ICD-10-CM | POA: Diagnosis present

## 2018-02-17 DIAGNOSIS — I110 Hypertensive heart disease with heart failure: Secondary | ICD-10-CM | POA: Diagnosis present

## 2018-02-17 DIAGNOSIS — R296 Repeated falls: Secondary | ICD-10-CM | POA: Diagnosis present

## 2018-02-17 DIAGNOSIS — Z23 Encounter for immunization: Secondary | ICD-10-CM

## 2018-02-17 DIAGNOSIS — Z87891 Personal history of nicotine dependence: Secondary | ICD-10-CM

## 2018-02-17 DIAGNOSIS — Z79899 Other long term (current) drug therapy: Secondary | ICD-10-CM

## 2018-02-17 DIAGNOSIS — Z9181 History of falling: Secondary | ICD-10-CM

## 2018-02-17 LAB — GLUCOSE, CAPILLARY: Glucose-Capillary: 161 mg/dL — ABNORMAL HIGH (ref 70–99)

## 2018-02-17 LAB — COMPREHENSIVE METABOLIC PANEL
ALBUMIN: 4.4 g/dL (ref 3.5–5.0)
ALK PHOS: 54 U/L (ref 38–126)
ALT: 12 U/L (ref 0–44)
AST: 18 U/L (ref 15–41)
Anion gap: 8 (ref 5–15)
BILIRUBIN TOTAL: 1.2 mg/dL (ref 0.3–1.2)
BUN: 18 mg/dL (ref 8–23)
CALCIUM: 9 mg/dL (ref 8.9–10.3)
CO2: 27 mmol/L (ref 22–32)
CREATININE: 0.71 mg/dL (ref 0.44–1.00)
Chloride: 106 mmol/L (ref 98–111)
GFR calc Af Amer: >60 mL/min (ref >60)
GFR calc non Af Amer: >60 mL/min (ref >60)
Glucose, Bld: 105 mg/dL — ABNORMAL HIGH (ref 70–99)
Potassium: 4.3 mmol/L (ref 3.5–5.1)
SODIUM: 141 mmol/L (ref 135–145)
TOTAL PROTEIN: 8.1 g/dL (ref 6.5–8.1)

## 2018-02-17 LAB — TROPONIN I: Troponin I: <0.03 ng/mL (ref <0.03)

## 2018-02-17 LAB — CBC
HEMATOCRIT: 30.9 % — AB (ref 36.0–46.0)
HEMOGLOBIN: 9.6 g/dL — AB (ref 12.0–15.0)
MCH: 32 pg (ref 26.0–34.0)
MCHC: 31.1 g/dL (ref 30.0–36.0)
MCV: 103 fL — ABNORMAL HIGH (ref 80.0–100.0)
Platelets: 326 10*3/uL (ref 150–400)
RBC: 3 MIL/uL — AB (ref 3.87–5.11)
RDW: 26.9 % — ABNORMAL HIGH (ref 11.5–15.5)
WBC: 18.1 10*3/uL — AB (ref 4.0–10.5)
nRBC: 0.9 % — ABNORMAL HIGH (ref 0.0–0.2)

## 2018-02-17 LAB — LIPASE, BLOOD: Lipase: 26 U/L (ref 11–51)

## 2018-02-17 MED ORDER — FENTANYL CITRATE (PF) 100 MCG/2ML IJ SOLN
INTRAMUSCULAR | Status: AC
  Administered 2018-02-17: 50 ug via INTRAVENOUS
  Filled 2018-02-17: qty 2

## 2018-02-17 MED ORDER — DOCUSATE SODIUM 100 MG PO CAPS
100.0000 mg | ORAL_CAPSULE | Freq: Two times a day (BID) | ORAL | Status: DC
  Administered 2018-02-17: 100 mg via ORAL
  Filled 2018-02-17: qty 1

## 2018-02-17 MED ORDER — ALBUTEROL SULFATE (2.5 MG/3ML) 0.083% IN NEBU
2.5000 mg | INHALATION_SOLUTION | RESPIRATORY_TRACT | Status: DC | PRN
  Administered 2018-02-17 – 2018-02-18 (×2): 2.5 mg via RESPIRATORY_TRACT
  Filled 2018-02-17 (×2): qty 3

## 2018-02-17 MED ORDER — TRAZODONE HCL 50 MG PO TABS: 25.0000 mg | ORAL_TABLET | Freq: Every evening | ORAL | Status: DC | PRN

## 2018-02-17 MED ORDER — AMLODIPINE BESYLATE 5 MG PO TABS
2.5000 mg | ORAL_TABLET | Freq: Every day | ORAL | Status: DC
  Administered 2018-02-17: 2.5 mg via ORAL
  Filled 2018-02-17: qty 1

## 2018-02-17 MED ORDER — ONDANSETRON HCL 4 MG/2ML IJ SOLN
4.0000 mg | Freq: Four times a day (QID) | INTRAMUSCULAR | Status: DC | PRN
  Administered 2018-02-24: 4 mg via INTRAVENOUS
  Filled 2018-02-17: qty 2

## 2018-02-17 MED ORDER — POLYVINYL ALCOHOL 1.4 % OP SOLN
1.0000 [drp] | OPHTHALMIC | Status: DC | PRN
  Filled 2018-02-17: qty 15

## 2018-02-17 MED ORDER — ESCITALOPRAM OXALATE 10 MG PO TABS
5.0000 mg | ORAL_TABLET | Freq: Every day | ORAL | Status: DC
  Administered 2018-02-17 – 2018-02-23 (×6): 5 mg via ORAL
  Filled 2018-02-17 (×7): qty 0.5

## 2018-02-17 MED ORDER — METHYLPREDNISOLONE SODIUM SUCC 125 MG IJ SOLR
125.0000 mg | INTRAMUSCULAR | Status: AC
  Administered 2018-02-17: 125 mg via INTRAVENOUS
  Filled 2018-02-17: qty 2

## 2018-02-17 MED ORDER — SODIUM CHLORIDE 0.9 % IV SOLN
INTRAVENOUS | Status: DC
  Administered 2018-02-17 – 2018-02-18 (×2): via INTRAVENOUS

## 2018-02-17 MED ORDER — FENTANYL CITRATE (PF) 100 MCG/2ML IJ SOLN
50.0000 ug | Freq: Once | INTRAMUSCULAR | Status: AC
  Administered 2018-02-17: 50 ug via INTRAVENOUS

## 2018-02-17 MED ORDER — INFLUENZA VAC SPLIT HIGH-DOSE 0.5 ML IM SUSY
0.5000 mL | PREFILLED_SYRINGE | INTRAMUSCULAR | Status: AC
  Administered 2018-02-22: 0.5 mL via INTRAMUSCULAR
  Filled 2018-02-17 (×2): qty 0.5

## 2018-02-17 MED ORDER — ALBUTEROL SULFATE (2.5 MG/3ML) 0.083% IN NEBU
2.5000 mg | INHALATION_SOLUTION | Freq: Once | RESPIRATORY_TRACT | Status: AC
  Administered 2018-02-17: 2.5 mg via RESPIRATORY_TRACT
  Filled 2018-02-17: qty 3

## 2018-02-17 MED ORDER — MIRABEGRON ER 25 MG PO TB24
25.0000 mg | ORAL_TABLET | Freq: Every day | ORAL | Status: DC
  Administered 2018-02-17 – 2018-02-26 (×9): 25 mg via ORAL
  Filled 2018-02-17 (×10): qty 1

## 2018-02-17 MED ORDER — SENNOSIDES-DOCUSATE SODIUM 8.6-50 MG PO TABS
2.0000 | ORAL_TABLET | Freq: Two times a day (BID) | ORAL | Status: DC
  Administered 2018-02-17 – 2018-02-25 (×14): 2 via ORAL
  Filled 2018-02-17 (×16): qty 2

## 2018-02-17 MED ORDER — HEPARIN SODIUM (PORCINE) 5000 UNIT/ML IJ SOLN
5000.0000 [IU] | Freq: Three times a day (TID) | INTRAMUSCULAR | Status: DC
  Administered 2018-02-17 – 2018-02-26 (×24): 5000 [IU] via SUBCUTANEOUS
  Filled 2018-02-17 (×24): qty 1

## 2018-02-17 MED ORDER — ONDANSETRON HCL 4 MG/2ML IJ SOLN
4.0000 mg | Freq: Once | INTRAMUSCULAR | Status: AC
  Administered 2018-02-17: 4 mg via INTRAVENOUS

## 2018-02-17 MED ORDER — MAGNESIUM OXIDE 400 (241.3 MG) MG PO TABS
400.0000 mg | ORAL_TABLET | Freq: Every day | ORAL | Status: DC
  Administered 2018-02-17 – 2018-02-26 (×9): 400 mg via ORAL
  Filled 2018-02-17 (×10): qty 1

## 2018-02-17 MED ORDER — BISACODYL 5 MG PO TBEC
5.0000 mg | DELAYED_RELEASE_TABLET | Freq: Every day | ORAL | Status: DC | PRN
  Filled 2018-02-17: qty 1

## 2018-02-17 MED ORDER — DICLOFENAC SODIUM 1 % TD GEL
2.0000 g | Freq: Four times a day (QID) | TRANSDERMAL | Status: DC
  Administered 2018-02-17 – 2018-02-26 (×28): 2 g via TOPICAL
  Filled 2018-02-17 (×2): qty 100

## 2018-02-17 MED ORDER — IPRATROPIUM-ALBUTEROL 0.5-2.5 (3) MG/3ML IN SOLN
3.0000 mL | Freq: Once | RESPIRATORY_TRACT | Status: AC
  Administered 2018-02-17: 3 mL via RESPIRATORY_TRACT
  Filled 2018-02-17: qty 3

## 2018-02-17 MED ORDER — VITAMIN D3 25 MCG (1000 UNIT) PO TABS
1000.0000 [IU] | ORAL_TABLET | Freq: Every day | ORAL | Status: DC
  Administered 2018-02-17 – 2018-02-26 (×8): 1000 [IU] via ORAL
  Filled 2018-02-17 (×9): qty 1

## 2018-02-17 MED ORDER — ALBUTEROL SULFATE HFA 108 (90 BASE) MCG/ACT IN AERS: 2.0000 | INHALATION_SPRAY | RESPIRATORY_TRACT | Status: DC | PRN

## 2018-02-17 MED ORDER — PAROXETINE HCL 10 MG PO TABS
10.0000 mg | ORAL_TABLET | Freq: Every day | ORAL | Status: DC
  Administered 2018-02-17: 10 mg via ORAL
  Filled 2018-02-17 (×2): qty 1

## 2018-02-17 MED ORDER — ACETAMINOPHEN 650 MG RE SUPP: 650.0000 mg | Freq: Four times a day (QID) | RECTAL | Status: DC | PRN

## 2018-02-17 MED ORDER — IOPAMIDOL (ISOVUE-370) INJECTION 76%
100.0000 mL | Freq: Once | INTRAVENOUS | Status: AC | PRN
  Administered 2018-02-17: 100 mL via INTRAVENOUS

## 2018-02-17 MED ORDER — ONDANSETRON HCL 4 MG PO TABS: 4.0000 mg | ORAL_TABLET | Freq: Four times a day (QID) | ORAL | Status: DC | PRN

## 2018-02-17 MED ORDER — IBUPROFEN 400 MG PO TABS
600.0000 mg | ORAL_TABLET | Freq: Four times a day (QID) | ORAL | Status: DC
  Administered 2018-02-17 – 2018-02-26 (×33): 600 mg via ORAL
  Filled 2018-02-17 (×32): qty 2

## 2018-02-17 MED ORDER — PHENOL 1.4 % MT LIQD
1.0000 | OROMUCOSAL | Status: DC | PRN
  Administered 2018-02-17: 1 via OROMUCOSAL
  Filled 2018-02-17: qty 177

## 2018-02-17 MED ORDER — ONDANSETRON HCL 4 MG/2ML IJ SOLN
INTRAMUSCULAR | Status: AC
  Administered 2018-02-17: 4 mg via INTRAVENOUS
  Filled 2018-02-17: qty 2

## 2018-02-17 MED ORDER — SENNA 8.6 MG PO TABS: 2.0000 | ORAL_TABLET | Freq: Every day | ORAL | Status: DC

## 2018-02-17 MED ORDER — ACETAMINOPHEN 325 MG PO TABS
650.0000 mg | ORAL_TABLET | Freq: Four times a day (QID) | ORAL | Status: DC | PRN
  Administered 2018-02-19: 650 mg via ORAL
  Filled 2018-02-17: qty 2

## 2018-02-17 NOTE — ED Triage Notes (Signed)
Pt arrives via ems with c/o back pain that radiates around to her abd pain. Pt has audible wheezing noted. EMS gave pt a duoneb prior to arrival. Pt denies any any injury to back.

## 2018-02-17 NOTE — ED Notes (Signed)
Pt's supplemental oxygen turned off, oxygen saturation dropped to 88%, 2L supplemental oxygen returned to patient.

## 2018-02-17 NOTE — ED Notes (Signed)
Pt went to medical imaging.   

## 2018-02-17 NOTE — ED Notes (Signed)
Pts O2 dropped to 80%, good wave form. Pt was placed on 4 liters [place] and Dr. Fanny Bien made aware and he stated to call respiratory.
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
## 2018-02-17 NOTE — ED Notes (Signed)
Pt given small cup of water.  

## 2018-02-17 NOTE — ED Notes (Signed)
Pt is back from medical imaging. 

## 2018-02-17 NOTE — ED Notes (Signed)
Respiratory at bedside.
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
## 2018-02-17 NOTE — ED Notes (Signed)
Admitting Dr at bedside

## 2018-02-17 NOTE — Progress Notes (Signed)
   02/17/18 1340  Clinical Encounter Type  Visited With Patient not available;Health care provider  Visit Type Initial;Spiritual support;Other (Comment)  Referral From Nurse  Consult/Referral To Chaplain  Spiritual Encounters  Spiritual Needs Other (Comment)   [facility] received an OR to help the patient update her AD. This appeared to initiated while the patient was in the ED. Once the patient come on the unit, I had some concerns about the patient current capacity to complete an AD. After discussing this with the patient's RN it was decided to give the patient a day or two to be evaluated. I will follow up if needed.
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
## 2018-02-17 NOTE — ED Notes (Signed)
Pt is 98% on 4 liters

## 2018-02-17 NOTE — Care Management Obs Status (Signed)
MEDICARE OBSERVATION STATUS NOTIFICATION   Patient Details  Name: Jaime [name] MRN: [id number] Date of Birth: 11/27/1927   Medicare Observation Status Notification Given:  Yes    Zanaya Baize A Tyjai Matuszak, RN 02/17/2018, 4:42 PM
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
## 2018-02-18 ENCOUNTER — Observation Stay: Payer: Medicare Other

## 2018-02-18 ENCOUNTER — Observation Stay: Payer: Self-pay

## 2018-02-18 DIAGNOSIS — F22 Delusional disorders: Secondary | ICD-10-CM

## 2018-02-18 LAB — CBC
HEMATOCRIT: 24.5 % — AB (ref 36.0–46.0)
HEMATOCRIT: 27.9 % — AB (ref 36.0–46.0)
HEMOGLOBIN: 7.6 g/dL — AB (ref 12.0–15.0)
HEMOGLOBIN: 8.7 g/dL — AB (ref 12.0–15.0)
MCH: 32.3 pg (ref 26.0–34.0)
MCH: 32.3 pg (ref 26.0–34.0)
MCHC: 31 g/dL (ref 30.0–36.0)
MCHC: 31.2 g/dL (ref 30.0–36.0)
MCV: 104.3 fL — ABNORMAL HIGH (ref 80.0–100.0)
MCV: 103.7 fL — ABNORMAL HIGH (ref 80.0–100.0)
NRBC: 0.7 % — AB (ref 0.0–0.2)
Platelets: 242 10*3/uL (ref 150–400)
Platelets: 269 10*3/uL (ref 150–400)
RBC: 2.35 MIL/uL — ABNORMAL LOW (ref 3.87–5.11)
RBC: 2.69 MIL/uL — ABNORMAL LOW (ref 3.87–5.11)
RDW: 26.5 % — AB (ref 11.5–15.5)
RDW: 27.1 % — ABNORMAL HIGH (ref 11.5–15.5)
WBC: 23.4 10*3/uL — AB (ref 4.0–10.5)
WBC: 30.8 10*3/uL — AB (ref 4.0–10.5)
nRBC: 0.6 % — ABNORMAL HIGH (ref 0.0–0.2)

## 2018-02-18 LAB — GLUCOSE, CAPILLARY
Glucose-Capillary: 103 mg/dL — ABNORMAL HIGH (ref 70–99)
Glucose-Capillary: 143 mg/dL — ABNORMAL HIGH (ref 70–99)

## 2018-02-18 LAB — BASIC METABOLIC PANEL
Anion gap: 5 (ref 5–15)
BUN: 22 mg/dL (ref 8–23)
CHLORIDE: 109 mmol/L (ref 98–111)
CO2: 25 mmol/L (ref 22–32)
CREATININE: 0.65 mg/dL (ref 0.44–1.00)
Calcium: 8.5 mg/dL — ABNORMAL LOW (ref 8.9–10.3)
GFR calc Af Amer: >60 mL/min (ref >60)
GFR calc non Af Amer: >60 mL/min (ref >60)
Glucose, Bld: 113 mg/dL — ABNORMAL HIGH (ref 70–99)
POTASSIUM: 4.2 mmol/L (ref 3.5–5.1)
SODIUM: 139 mmol/L (ref 135–145)

## 2018-02-18 LAB — TROPONIN I: Troponin I: <0.03 ng/mL (ref <0.03)

## 2018-02-18 LAB — MRSA PCR SCREENING: MRSA by PCR: NEGATIVE

## 2018-02-18 MED ORDER — LORAZEPAM 2 MG/ML IJ SOLN: 1.0000 mg | Freq: Once | INTRAMUSCULAR | Status: DC

## 2018-02-18 MED ORDER — SODIUM CHLORIDE 0.9 % IV SOLN: 1.0000 g | Freq: Three times a day (TID) | INTRAVENOUS | Status: DC

## 2018-02-18 MED ORDER — DILTIAZEM HCL-DEXTROSE 100-5 MG/100ML-% IV SOLN (PREMIX)
5.0000 mg/h | INTRAVENOUS | Status: DC
  Filled 2018-02-18: qty 100

## 2018-02-18 MED ORDER — DEXMEDETOMIDINE HCL IN NACL 400 MCG/100ML IV SOLN: 0.4000 ug/kg/h | INTRAVENOUS | Status: DC

## 2018-02-18 MED ORDER — POLYETHYLENE GLYCOL 3350 17 G PO PACK
17.0000 g | PACK | Freq: Every day | ORAL | Status: DC
  Administered 2018-02-18 – 2018-02-25 (×8): 17 g via ORAL
  Filled 2018-02-18 (×9): qty 1

## 2018-02-18 MED ORDER — LORAZEPAM 2 MG/ML IJ SOLN: 0.5000 mg | Freq: Once | INTRAMUSCULAR | Status: DC | PRN

## 2018-02-18 MED ORDER — IPRATROPIUM-ALBUTEROL 0.5-2.5 (3) MG/3ML IN SOLN
3.0000 mL | Freq: Four times a day (QID) | RESPIRATORY_TRACT | Status: DC
  Administered 2018-02-18 – 2018-02-26 (×27): 3 mL via RESPIRATORY_TRACT
  Filled 2018-02-18 (×28): qty 3

## 2018-02-18 MED ORDER — DILTIAZEM HCL 25 MG/5ML IV SOLN
INTRAVENOUS | Status: AC
  Administered 2018-02-18: 10:00:00
  Filled 2018-02-18: qty 5

## 2018-02-18 MED ORDER — PHENYLEPHRINE HCL-NACL 10-0.9 MG/250ML-% IV SOLN
0.0000 ug/min | INTRAVENOUS | Status: DC
  Filled 2018-02-18: qty 250

## 2018-02-18 MED ORDER — METHYLPREDNISOLONE SODIUM SUCC 125 MG IJ SOLR
125.0000 mg | Freq: Once | INTRAMUSCULAR | Status: AC
  Administered 2018-02-18: 125 mg via INTRAVENOUS
  Filled 2018-02-18: qty 2

## 2018-02-18 MED ORDER — FLEET ENEMA 7-19 GM/118ML RE ENEM
1.0000 | ENEMA | Freq: Once | RECTAL | Status: AC
  Administered 2018-02-18: 1 via RECTAL

## 2018-02-18 MED ORDER — FUROSEMIDE 10 MG/ML IJ SOLN
40.0000 mg | Freq: Every day | INTRAMUSCULAR | Status: DC
  Administered 2018-02-19 – 2018-02-26 (×8): 40 mg via INTRAVENOUS
  Filled 2018-02-18 (×8): qty 4

## 2018-02-18 MED ORDER — FUROSEMIDE 10 MG/ML IJ SOLN
20.0000 mg | Freq: Once | INTRAMUSCULAR | Status: AC
  Administered 2018-02-18: 20 mg via INTRAVENOUS
  Filled 2018-02-18: qty 2

## 2018-02-18 MED ORDER — METHYLPREDNISOLONE SODIUM SUCC 125 MG IJ SOLR
60.0000 mg | Freq: Four times a day (QID) | INTRAMUSCULAR | Status: DC
  Administered 2018-02-18 – 2018-02-24 (×24): 60 mg via INTRAVENOUS
  Filled 2018-02-18 (×24): qty 2

## 2018-02-18 MED ORDER — SODIUM CHLORIDE 0.9 % IV SOLN
1.0000 g | Freq: Two times a day (BID) | INTRAVENOUS | Status: DC
  Administered 2018-02-18 – 2018-02-24 (×13): 1 g via INTRAVENOUS
  Filled 2018-02-18 (×16): qty 1

## 2018-02-18 MED ORDER — SODIUM CHLORIDE 0.9 % IV BOLUS
500.0000 mL | Freq: Once | INTRAVENOUS | Status: AC
  Administered 2018-02-18: 500 mL via INTRAVENOUS

## 2018-02-18 MED ORDER — FUROSEMIDE 10 MG/ML IJ SOLN
40.0000 mg | Freq: Once | INTRAMUSCULAR | Status: AC
  Administered 2018-02-18: 40 mg via INTRAVENOUS
  Filled 2018-02-18: qty 4

## 2018-02-18 MED ORDER — VANCOMYCIN HCL IN DEXTROSE 1-5 GM/200ML-% IV SOLN
1000.0000 mg | Freq: Once | INTRAVENOUS | Status: AC
  Administered 2018-02-18: 1000 mg via INTRAVENOUS
  Filled 2018-02-18: qty 200

## 2018-02-18 MED ORDER — DILTIAZEM HCL 25 MG/5ML IV SOLN
10.0000 mg | Freq: Once | INTRAVENOUS | Status: AC
  Administered 2018-02-18: 10 mg via INTRAVENOUS

## 2018-02-18 MED ORDER — DEXMEDETOMIDINE HCL IN NACL 400 MCG/100ML IV SOLN
0.4000 ug/kg/h | INTRAVENOUS | Status: DC
  Administered 2018-02-18: 0.8 ug/kg/h via INTRAVENOUS

## 2018-02-18 NOTE — Progress Notes (Signed)
John, RN from IV team called and stated that the IV team would not be able to place the PICC line until later this evening.  This RN informed him that Dr. Lonn Georgia stated if the IV team can't come now then we need to call [place] [facility] to come place PICC line.

## 2018-02-18 NOTE — Progress Notes (Signed)
Received call from tele pts HR in the 120's-140's, pts O2 also 87%-88% on 2 L nasal cannula. MD Luberta Mutter notified.

## 2018-02-18 NOTE — Progress Notes (Signed)
Dr. Lonn Georgia gave order to stop precedex drip since patient's blood pressure is so low 69/47 and to give 0.5 mg of ativan if patient becomes agitated and to keep him updated.

## 2018-02-18 NOTE — Progress Notes (Signed)
Dr. Lonn Georgia present and RN made MD aware that patient has had several pauses in her rhythm.  MD acknowledged and gave no new orders.

## 2018-02-18 NOTE — Progress Notes (Signed)
PT Cancellation Note  Patient Details Name: Jaime [name] MRN: [id number] DOB: Sep 21, 1927   Cancelled Treatment:    Reason Eval/Treat Not Completed: Medical issues which prohibited therapy Pt with respiratory distress and tachycardia, transferred to CCU.  Will need new orders when medically necessary.  Malachi Pro, DPT 02/18/2018, 10:04 AM

## 2018-02-18 NOTE — Progress Notes (Signed)
Called and spoke with Dr. Lonn Georgia and made MD aware that patient's blood pressure remains low and has been off precedex drip for 1 hour.  MD asked if PICC line was in place and RN informed MD that the IV team has not called nor came yet.  Dr. Lonn Georgia stated "give 500 cc bolus of NS and if that doesn't bring her pressure up then start neo drip and call the IV team and tell them if they can't come now then we are calling vascular wellness to do it."

## 2018-02-18 NOTE — Progress Notes (Signed)
Titrated precedex back up because patient took bipap mask off as soon as husband left room.  Safety mitts reapplied.  Continuing to monitor.

## 2018-02-18 NOTE — Progress Notes (Signed)
PHARMACY NOTE:  ANTIMICROBIAL RENAL DOSAGE ADJUSTMENT  Current antimicrobial regimen includes a mismatch between antimicrobial dosage and estimated renal function.  As per policy approved by the Pharmacy & Therapeutics and Medical Executive Committees, the antimicrobial dosage will be adjusted accordingly.  Current antimicrobial dosage:  Cefepime 1 g IV q8h  Indication: respiratory failure, increasing WBC  Renal Function:  Estimated Creatinine Clearance: 53.9 mL/min (by C-G formula based on SCr of 0.65 mg/dL).     Antimicrobial dosage has been changed to:  Cefepime 1 g IV q12h     Thank you for allowing pharmacy to be a part of this patient's care.  Marty Heck, [facility] 02/18/2018 10:28 AM

## 2018-02-18 NOTE — Progress Notes (Signed)
Pt placed on non rebreather by respiratory. Pt keeps removing mask and trying to get up. MD Luberta Mutter notified pts HR still in the 130's, pt pulling on tubes. MD transferring pt to ICU.

## 2018-02-18 NOTE — Progress Notes (Signed)
Spoke with Scientist, forensic, states CVW has just completed PICC insertion.
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
## 2018-02-18 NOTE — Progress Notes (Signed)
MD Luberta Mutter at bedside placing orders.

## 2018-02-18 NOTE — Progress Notes (Signed)
Received pt from floor, pt placed on BiPAP per MD. Pt trying to pull off mask, pt informed not to touch mask it is helping her breathe, pt redirected.

## 2018-02-18 NOTE — Progress Notes (Signed)
Patient still has obvious respiratory distress, patient not keeping oxygen on and is very restless.  On 6 L sats were like 92% so respiratory therapist tried to put her on 100% nonrebreather to improve saturation and decreased tachycardia, patient is presently tachycardic with heart rate up to 120 bpm, hypoxic.  Spoke with charge nurse Tresa Endo, patient will be monitored in stepdown for close monitoring of impending respiratory status and possible need for intubation.  Discussed with patient's nurse, husband.

## 2018-02-18 NOTE — Progress Notes (Signed)
[facility] - [place] at [facility]   PATIENT NAME: Jaime [name]    MR#:  [id number]  DATE OF BIRTH:  [birth year]  SUBJECTIVE: Admitted yesterday for back pain, found to have L4 compression fracture.  This morning patient breathing got worse associated with hypoxia, O2 saturation 92% on 6 L, patient was never on oxygen.  Also he is tachycardic with heart rate up to 130 bpm.  Patient is having difficulty breathing.  Stop with IV fluids.  Patient is getting 1 dose of IV Lasix 40 mg, 1 dose of Solu-Medrol 120 mg, chest x-ray portable is ordered.  Patient's husband is at bedside.  CHIEF COMPLAINT:   Chief Complaint  Patient presents with  . Back Pain    REVIEW OF SYSTEMS:   ROS CONSTITUTIONAL: No fever, fatigue or weakness.  EYES: Legally blind.  EARS, NOSE, AND THROAT: No tinnitus or ear pain.  RESPIRATORY: Has shortness of breath, using accessory muscles of respiration.Marland Kitchen  CARDIOVASCULAR: No chest pain, orthopnea, edema.  GASTROINTESTINAL: No nausea, vomiting, diarrhea or abdominal pain.  GENITOURINARY: No dysuria, hematuria.  ENDOCRINE: No polyuria, nocturia,  HEMATOLOGY: No anemia, easy bruising or bleeding SKIN: No rash or lesion. MUSCULOSKELETAL: Severe low back pain. NEUROLOGIC: No tingling, numbness, weakness.  PSYCHIATRY: Anxious. DRUG ALLERGIES:   Allergies  Allergen Reactions  . Codeine Nausea And Vomiting    Pt states she can't take oxycodone.  She states she can take Percocet.  . Levofloxacin Other (See Comments)  . Oxycodone Nausea And Vomiting  . Tramadol Hcl Anxiety    VITALS:  Blood pressure (!) 149/62, pulse (!) 102, temperature 98.7 F (37.1 C), temperature source Oral, resp. rate 19, weight 96.6 kg, SpO2 (!) 89 %.  PHYSICAL EXAMINATION:  GENERAL:  84 y.o.-year-old patient lying in the bed with obvious respiratory distress, using accessory muscles of respiration. EYES: Pupils equal, round, reactive to light . No scleral  icterus. Extraocular muscles intact.  HEENT: Head atraumatic, normocephalic. Oropharynx and nasopharynx clear.  NECK:  Supple, no jugular venous distention. No thyroid enlargement, no tenderness.  LUNGS: Bilateral coarse breath sounds present, basilar crepitus present. CARDIOVASCULAR: S1, S2 tachycardic. No murmurs, rubs, or gallops.  ABDOMEN: Soft, nontender, nondistended. Bowel sounds present. No organomegaly or mass.  EXTREMITIES: No pedal edema, cyanosis, or clubbing.  NEUROLOGIC: Patient is unstable unable to do full neuro exam because she is in respiratory distress.  But no gross neurological deficit is observed.Marland Kitchen  PSYCHIATRIC: The patient is alert and oriented x 3.  SKIN: No obvious rash, lesion, or ulcer.    LABORATORY PANEL:   CBC Recent Labs  Lab 02/18/18 0552  WBC 23.4*  HGB 7.6*  HCT 24.5*  PLT 242   ------------------------------------------------------------------------------------------------------------------  Chemistries  Recent Labs  Lab 02/17/18 0511 02/18/18 0552  NA 141 139  K 4.3 4.2  CL 106 109  CO2 27 25  GLUCOSE 105* 113*  BUN 18 22  CREATININE 0.71 0.65  CALCIUM 9.0 8.5*  AST 18  --   ALT 12  --   ALKPHOS 54  --   BILITOT 1.2  --    ------------------------------------------------------------------------------------------------------------------  Cardiac Enzymes Recent Labs  Lab 02/17/18 0511  TROPONINI <0.03   ------------------------------------------------------------------------------------------------------------------  RADIOLOGY:  Ct T-spine No Charge  Result Date: 02/17/2018 CLINICAL DATA:  No known injury. Back pain in the thoracic and lumbar region. EXAM: CT THORACIC AND LUMBAR SPINE WITHOUT CONTRAST TECHNIQUE: Multidetector CT imaging of the thoracic and lumbar spine was performed without contrast. Multiplanar CT  image reconstructions were also generated. COMPARISON:  11/28/2015 CT abdomen. CT study of the neck done  06/19/2017 that includes the upper thoracic region. FINDINGS: CT THORACIC SPINE FINDINGS Alignment: Normal Vertebrae: No acute finding in the thoracic spine. There is old minor loss of height at T3. There are old superior endplate depression centrally which could be minimal fractures or Schmorl's nodes at the superior endplate of T10 and the superior endplate of T12. At the inferior endplate of T12, there is new conchae cavity which was not present in 2017. This therefore represents an interval compression deformity but the exact age is indeterminate by CT. Paraspinal and other soft tissues: See results of chest CT. Disc levels: No evidence of significant degenerative disc disease. There is ordinary mild facet osteoarthritis. No apparent compressive stenosis of the canal or foramina. CT LUMBAR SPINE FINDINGS Segmentation: 5 lumbar type vertebral bodies. Alignment: Curvature convex to the right in the thoracolumbar junction region and to the left in the lower lumbar region. Vertebrae: Old compression fracture at L4 which is unchanged since 2017, with loss of height of about 40%. No retropulsed bone. No other fracture or primary bone lesion. Discogenic endplate changes. Paraspinal and other soft tissues: See results of abdominal CT. Disc levels: Advanced degenerative disc disease at L1-2 and L2-3 with complete degeneration of the disc and vacuum phenomenon. Endplate osteophytes but no apparent herniated soft disc material. The L3-4 and L4-5 discs shown normal height, but do show bulging. At L5-S1, there is disc degeneration with mild loss of height and vacuum phenomenon. There is facet arthropathy throughout the lumbar region that could be associated with back pain. There is no apparent compressive central canal stenosis. There is bilateral foraminal narrowing at L1-2 and L2-3 which is chronic. There appears to be mild to moderate multifactorial stenosis at L3-4 and L4-5. There is some foraminal narrowing at L5-S1.  IMPRESSION: CT THORACIC SPINE IMPRESSION In the thoracic region, the only possibly recent finding is mild central inferior endplate depression at T12 which was not present on a CT scan of the abdomen in 2017. Therefore it is new since then, but the exact age is indeterminate. There is an old superior endplate Schmorl's node at T12. There does not appear to be any retropulsed bone. If the patient is painful in this region, this could be a new fracture. Old minimal compression deformities at T3 and affecting the superior endplate also at T10. CT LUMBAR SPINE IMPRESSION No sign of acute fracture in the lumbar region. Old compression deformity of L4 with loss of height of about 40%. Scoliosis and degenerative changes in the lumbar spine as outlined above. The degenerative changes are quite advanced and could certainly be associated with back pain. There is potential for foraminal nerve root compression at L1-2, L2-3 and L5-S1 and potential for central canal, lateral recess and foraminal narrowing at L3-4 and L4-5. Electronically Signed   By: Paulina Fusi M.D.   On: 02/17/2018 07:14   Ct L-spine No Charge  Result Date: 02/17/2018 CLINICAL DATA:  No known injury. Back pain in the thoracic and lumbar region. EXAM: CT THORACIC AND LUMBAR SPINE WITHOUT CONTRAST TECHNIQUE: Multidetector CT imaging of the thoracic and lumbar spine was performed without contrast. Multiplanar CT image reconstructions were also generated. COMPARISON:  11/28/2015 CT abdomen. CT study of the neck done 06/19/2017 that includes the upper thoracic region. FINDINGS: CT THORACIC SPINE FINDINGS Alignment: Normal Vertebrae: No acute finding in the thoracic spine. There is old minor loss of height  at T3. There are old superior endplate depression centrally which could be minimal fractures or Schmorl's nodes at the superior endplate of T10 and the superior endplate of T12. At the inferior endplate of T12, there is new conchae cavity which was not  present in 2017. This therefore represents an interval compression deformity but the exact age is indeterminate by CT. Paraspinal and other soft tissues: See results of chest CT. Disc levels: No evidence of significant degenerative disc disease. There is ordinary mild facet osteoarthritis. No apparent compressive stenosis of the canal or foramina. CT LUMBAR SPINE FINDINGS Segmentation: 5 lumbar type vertebral bodies. Alignment: Curvature convex to the right in the thoracolumbar junction region and to the left in the lower lumbar region. Vertebrae: Old compression fracture at L4 which is unchanged since 2017, with loss of height of about 40%. No retropulsed bone. No other fracture or primary bone lesion. Discogenic endplate changes. Paraspinal and other soft tissues: See results of abdominal CT. Disc levels: Advanced degenerative disc disease at L1-2 and L2-3 with complete degeneration of the disc and vacuum phenomenon. Endplate osteophytes but no apparent herniated soft disc material. The L3-4 and L4-5 discs shown normal height, but do show bulging. At L5-S1, there is disc degeneration with mild loss of height and vacuum phenomenon. There is facet arthropathy throughout the lumbar region that could be associated with back pain. There is no apparent compressive central canal stenosis. There is bilateral foraminal narrowing at L1-2 and L2-3 which is chronic. There appears to be mild to moderate multifactorial stenosis at L3-4 and L4-5. There is some foraminal narrowing at L5-S1. IMPRESSION: CT THORACIC SPINE IMPRESSION In the thoracic region, the only possibly recent finding is mild central inferior endplate depression at T12 which was not present on a CT scan of the abdomen in 2017. Therefore it is new since then, but the exact age is indeterminate. There is an old superior endplate Schmorl's node at T12. There does not appear to be any retropulsed bone. If the patient is painful in this region, this could be a new  fracture. Old minimal compression deformities at T3 and affecting the superior endplate also at T10. CT LUMBAR SPINE IMPRESSION No sign of acute fracture in the lumbar region. Old compression deformity of L4 with loss of height of about 40%. Scoliosis and degenerative changes in the lumbar spine as outlined above. The degenerative changes are quite advanced and could certainly be associated with back pain. There is potential for foraminal nerve root compression at L1-2, L2-3 and L5-S1 and potential for central canal, lateral recess and foraminal narrowing at L3-4 and L4-5. Electronically Signed   By: Paulina Fusi M.D.   On: 02/17/2018 07:14   Dg Chest Port 1 View  Result Date: 02/17/2018 CLINICAL DATA:  Back pain radiating to the abdomen. Wheezing. EXAM: PORTABLE CHEST 1 VIEW COMPARISON:  01/19/2018 FINDINGS: Cardiac enlargement. Lungs are clear. No blunting of costophrenic angles. No pneumothorax. Bilateral breast implants. Scattered metallic foreign bodies in the soft tissues. No change since prior study. Calcification of the aorta. Degenerative changes in the spine and shoulders. IMPRESSION: Cardiac enlargement. No evidence of active pulmonary disease. Electronically Signed   By: Burman Nieves M.D.   On: 02/17/2018 05:43   Ct Angio Chest/abd/pel For Dissection W And/or Wo Contrast  Result Date: 02/17/2018 CLINICAL DATA:  Back pain radiating to the abdomen. Wheezing. No injury. EXAM: CT ANGIOGRAPHY CHEST, ABDOMEN AND PELVIS TECHNIQUE: Multidetector CT imaging through the chest, abdomen and pelvis was performed  using the standard protocol during bolus administration of intravenous contrast. Multiplanar reconstructed images and MIPs were obtained and reviewed to evaluate the vascular anatomy. CONTRAST:  ISOVUE-370 IOPAMIDOL (ISOVUE-370) INJECTION 76% COMPARISON:  CT abdomen and pelvis 11/28/2015 FINDINGS: CTA CHEST FINDINGS Cardiovascular: Noncontrast CT images of the chest demonstrate no evidence  of intramural hematoma. Aortic and coronary artery calcifications. Calcified mediastinal lymph nodes. Calcified granulomas in the lungs, liver, and spleen. Peripherally calcified breast implants bilaterally. Images obtained during arterial phase after contrast administration demonstrate normal caliber thoracic aorta. No aortic dissection or aneurysm. Great vessel origins are patent. Central pulmonary arteries are patent without evidence of significant pulmonary embolus. Normal heart size. No pericardial effusions. Mediastinum/Nodes: Esophagus is decompressed. Large esophageal hiatal hernia behind the heart containing most of the stomach. Surgical clips in the base of the neck. Lungs/Pleura: Atelectasis in the lung bases. No consolidation or airspace disease. No pleural effusions. No pneumothorax. Musculoskeletal: Degenerative changes in the spine and shoulders. Schmorl's nodes at multiple levels. Old appearing left posterior rib fractures. Review of the MIP images confirms the above findings. CTA ABDOMEN AND PELVIS FINDINGS VASCULAR Aorta: Normal caliber aorta without aneurysm, dissection, vasculitis or significant stenosis. Aortic calcification. Celiac: Patent without evidence of aneurysm, dissection, vasculitis or significant stenosis. SMA: Patent without evidence of aneurysm, dissection, vasculitis or significant stenosis. Renals: Both renal arteries are patent without evidence of aneurysm, dissection, vasculitis, fibromuscular dysplasia or significant stenosis. IMA: Patent without evidence of aneurysm, dissection, vasculitis or significant stenosis. Inflow: Patent without evidence of aneurysm, dissection, vasculitis or significant stenosis. Veins: No obvious venous abnormality within the limitations of this arterial phase study. Review of the MIP images confirms the above findings. NON-VASCULAR Hepatobiliary: No focal liver abnormality is seen. Status post cholecystectomy. No biliary dilatation. Pancreas: Fatty  infiltration of the pancreas. No acute inflammatory changes. Spleen: Normal in size without focal abnormality. Adrenals/Urinary Tract: Adrenal glands are unremarkable. Kidneys are normal, without renal calculi, focal lesion, or hydronephrosis. Bladder is unremarkable. Stomach/Bowel: Stomach is within normal limits. Appendix appears normal. No evidence of bowel wall thickening, distention, or inflammatory changes. Lymphatic: No significant lymphadenopathy. Reproductive: Uterus and bilateral adnexa are unremarkable. Other: No free air or free fluid in the abdomen. Abdominal wall musculature appears intact. Musculoskeletal: Lumbar scoliosis convex towards the right. Compression deformities of T12 and L4. Degenerative changes throughout the lumbar spine. No destructive bone lesions. Review of the MIP images confirms the above findings. IMPRESSION: No evidence of aneurysm or dissection of the thoracic or abdominal aorta. No evidence of active pulmonary disease. No acute process demonstrated in the abdomen or pelvis. No evidence of bowel obstruction or inflammation. Large esophageal hiatal hernia. Aortic Atherosclerosis (ICD10-I70.0). Electronically Signed   By: Burman Nieves M.D.   On: 02/17/2018 06:59    EKG:   Orders placed or performed during the hospital encounter of 02/17/18  . ED EKG  . ED EKG    ASSESSMENT AND PLAN:   84 year old female with history of COPD comes in because of severe low back pain and found to have L4 compression fracture 1.  Severe acute on chronic low back pain with L4 compression fracture and with 40% height loss, obtain orthopedic consult 2.  Acute respiratory failure with hypoxia likely due to COPD exacerbation, CHF exacerbation: Patient is receiving IV fluids because of elevated white count on admission but patient has bilateral crackles and developed hypoxia, requiring oxygen support, O2 sats 92% on 6 L, respiratory therapist changed to 100% nonrebreather, I ordered stat  dose of Lasix, 80 mg.  IV push, discontinued IV fluids, chest x-ray portable ordered, continue bronchodilators with DuoNebs every 4 hours, Solu-Medrol every 12 hours from tomorrow,\ disCussed with husband, the patient respiratory status improved with this measures we will watch her here if not patient needs to be transferred to ICU for respiratory distress and possible need for intubation, husband and that patient has no healthcare living will, wants everything to be done including CPR, ventilator. All the records are reviewed and case discussed with Care Management/Social Workerr. Management plans discussed with the patient, family and they are in agreement.  CODE STATUS: Full code  TOTAL TIME TAKING CARE OF THIS PATIENT: 32 minutes.   More than 50% time spent in counseling, coordination of care Discussed with patient's husband   Katha Hamming M.D on 02/18/2018 at 9:19 AM  Between 7am to 6pm - Pager - [phone]  After 6pm go to [email or web address] - password [email or web address] [facility]  [facility]  Office  [phone]  CC: Primary care physician; Erasmo Downer, MD   Note: This dictation was prepared with Dragon dictation along with smaller phrase technology. Any transcriptional errors that result from this process are unintentional.

## 2018-02-18 NOTE — Progress Notes (Signed)
Since patient has been on precedex drip patient's heart rate has dropped into the 80's and one time dropped down into the 50's.  Patient's blood pressure has also dropped. BP when awake 93/60 and prior to that when asleep 73/47.   Patient arouses to voice.  MD gave order to perform EKG at bedside.  EKG performed at bedside and showed to Dr. Lonn Georgia and MD stated that it looks like patient is in Afib.  MD gave order to not start cardizem drip and to try to titrate precedex drip to keep patient calm so she will keep BiPap mask on but to also titrate precedex drip to keep patient's blood pressure from dropping too low.  Husband at bedside and RN updated him on patient's care.

## 2018-02-18 NOTE — Progress Notes (Signed)
RN called and spoke with Dr. Lonn Georgia and made MD aware that patient is more awake and has taken her bipap and mitts off with Non-labored breathing on 2 L nasal cannula and heart rate up 90s-low 100s afib with normalized blood pressure.  Patient alert to self only.  MD stated to continue to monitor patient's breathing.

## 2018-02-18 NOTE — Progress Notes (Addendum)
Contacted Brittney, RN to inform her that it would place PICC this afternoon. She stated " that wasn't going to work." Dr. Lonn Georgia wanted CVW to place PICC if we were not able to place it in a timely fashion. The order is placed as a routine order at 1021 this am.

## 2018-02-18 NOTE — Progress Notes (Signed)
Chaplain was called in office to meet Pt in ICU. Chaplain responded before Pt arrive and searched for family. Chaplain found husband in waiting area. Husband talked about wife's condition. He told the story of wife's blindness and possible causes of pt's thoughts of not deserving close relationship. Chaplain practiced active listening and possible alternate views of possible grief the Pt may be suffering. Chaplain practiced pastoral presence and mindfulness. Chaplain was called away for a code stemi. Chaplain returned and offered prayer.    02/18/18 1100  Clinical Encounter Type  Visited With Patient and family together  Visit Type Spiritual support  Referral From Nurse  Spiritual Encounters  Spiritual Needs Prayer;Emotional

## 2018-02-19 ENCOUNTER — Observation Stay: Payer: Medicare Other

## 2018-02-19 DIAGNOSIS — I110 Hypertensive heart disease with heart failure: Secondary | ICD-10-CM | POA: Diagnosis present

## 2018-02-19 DIAGNOSIS — I480 Paroxysmal atrial fibrillation: Secondary | ICD-10-CM | POA: Diagnosis not present

## 2018-02-19 DIAGNOSIS — I5033 Acute on chronic diastolic (congestive) heart failure: Secondary | ICD-10-CM | POA: Diagnosis not present

## 2018-02-19 DIAGNOSIS — M549 Dorsalgia, unspecified: Secondary | ICD-10-CM | POA: Diagnosis present

## 2018-02-19 DIAGNOSIS — F22 Delusional disorders: Secondary | ICD-10-CM | POA: Diagnosis not present

## 2018-02-19 DIAGNOSIS — F600 Paranoid personality disorder: Secondary | ICD-10-CM | POA: Diagnosis present

## 2018-02-19 DIAGNOSIS — M4856XA Collapsed vertebra, not elsewhere classified, lumbar region, initial encounter for fracture: Secondary | ICD-10-CM | POA: Diagnosis present

## 2018-02-19 DIAGNOSIS — M545 Low back pain: Secondary | ICD-10-CM | POA: Diagnosis not present

## 2018-02-19 DIAGNOSIS — I4891 Unspecified atrial fibrillation: Secondary | ICD-10-CM | POA: Diagnosis not present

## 2018-02-19 DIAGNOSIS — F0390 Unspecified dementia without behavioral disturbance: Secondary | ICD-10-CM | POA: Diagnosis present

## 2018-02-19 DIAGNOSIS — H9190 Unspecified hearing loss, unspecified ear: Secondary | ICD-10-CM | POA: Diagnosis present

## 2018-02-19 DIAGNOSIS — R0602 Shortness of breath: Secondary | ICD-10-CM | POA: Diagnosis not present

## 2018-02-19 DIAGNOSIS — J9691 Respiratory failure, unspecified with hypoxia: Secondary | ICD-10-CM | POA: Diagnosis not present

## 2018-02-19 DIAGNOSIS — M5136 Other intervertebral disc degeneration, lumbar region: Secondary | ICD-10-CM | POA: Diagnosis present

## 2018-02-19 DIAGNOSIS — M5137 Other intervertebral disc degeneration, lumbosacral region: Secondary | ICD-10-CM | POA: Diagnosis present

## 2018-02-19 DIAGNOSIS — I10 Essential (primary) hypertension: Secondary | ICD-10-CM | POA: Diagnosis not present

## 2018-02-19 DIAGNOSIS — K5900 Constipation, unspecified: Secondary | ICD-10-CM | POA: Diagnosis present

## 2018-02-19 DIAGNOSIS — G8929 Other chronic pain: Secondary | ICD-10-CM | POA: Diagnosis present

## 2018-02-19 DIAGNOSIS — J441 Chronic obstructive pulmonary disease with (acute) exacerbation: Secondary | ICD-10-CM | POA: Diagnosis not present

## 2018-02-19 DIAGNOSIS — T380X5A Adverse effect of glucocorticoids and synthetic analogues, initial encounter: Secondary | ICD-10-CM | POA: Diagnosis present

## 2018-02-19 DIAGNOSIS — J9690 Respiratory failure, unspecified, unspecified whether with hypoxia or hypercapnia: Secondary | ICD-10-CM | POA: Diagnosis present

## 2018-02-19 DIAGNOSIS — J810 Acute pulmonary edema: Secondary | ICD-10-CM | POA: Diagnosis present

## 2018-02-19 DIAGNOSIS — F333 Major depressive disorder, recurrent, severe with psychotic symptoms: Secondary | ICD-10-CM

## 2018-02-19 DIAGNOSIS — D649 Anemia, unspecified: Secondary | ICD-10-CM | POA: Diagnosis present

## 2018-02-19 DIAGNOSIS — Z23 Encounter for immunization: Secondary | ICD-10-CM | POA: Diagnosis present

## 2018-02-19 DIAGNOSIS — F329 Major depressive disorder, single episode, unspecified: Secondary | ICD-10-CM | POA: Diagnosis present

## 2018-02-19 DIAGNOSIS — I4819 Other persistent atrial fibrillation: Secondary | ICD-10-CM | POA: Diagnosis not present

## 2018-02-19 DIAGNOSIS — M4690 Unspecified inflammatory spondylopathy, site unspecified: Secondary | ICD-10-CM | POA: Diagnosis present

## 2018-02-19 DIAGNOSIS — K449 Diaphragmatic hernia without obstruction or gangrene: Secondary | ICD-10-CM | POA: Diagnosis present

## 2018-02-19 DIAGNOSIS — I4510 Unspecified right bundle-branch block: Secondary | ICD-10-CM | POA: Diagnosis present

## 2018-02-19 DIAGNOSIS — J9601 Acute respiratory failure with hypoxia: Secondary | ICD-10-CM | POA: Diagnosis not present

## 2018-02-19 DIAGNOSIS — H548 Legal blindness, as defined in USA: Secondary | ICD-10-CM | POA: Diagnosis present

## 2018-02-19 DIAGNOSIS — E1165 Type 2 diabetes mellitus with hyperglycemia: Secondary | ICD-10-CM | POA: Diagnosis present

## 2018-02-19 DIAGNOSIS — R296 Repeated falls: Secondary | ICD-10-CM | POA: Diagnosis present

## 2018-02-19 LAB — URINALYSIS, COMPLETE (UACMP) WITH MICROSCOPIC
Bilirubin Urine: NEGATIVE
Glucose, UA: NEGATIVE mg/dL
KETONES UR: NEGATIVE mg/dL
NITRITE: NEGATIVE
PH: 5 (ref 5.0–8.0)
Protein, ur: NEGATIVE mg/dL
SPECIFIC GRAVITY, URINE: 1.018 (ref 1.005–1.030)

## 2018-02-19 LAB — CBC
HCT: 23.8 % — ABNORMAL LOW (ref 36.0–46.0)
Hemoglobin: 7.3 g/dL — ABNORMAL LOW (ref 12.0–15.0)
MCH: 31.9 pg (ref 26.0–34.0)
MCHC: 30.7 g/dL (ref 30.0–36.0)
MCV: 103.9 fL — ABNORMAL HIGH (ref 80.0–100.0)
NRBC: 0.2 % (ref 0.0–0.2)
PLATELETS: 218 10*3/uL (ref 150–400)
RBC: 2.29 MIL/uL — AB (ref 3.87–5.11)
RDW: 27.4 % — AB (ref 11.5–15.5)
WBC: 17.5 10*3/uL — AB (ref 4.0–10.5)

## 2018-02-19 LAB — COMPREHENSIVE METABOLIC PANEL
ALBUMIN: 3.2 g/dL — AB (ref 3.5–5.0)
ALT: 20 U/L (ref 0–44)
AST: 21 U/L (ref 15–41)
Alkaline Phosphatase: 47 U/L (ref 38–126)
Anion gap: 7 (ref 5–15)
BUN: 31 mg/dL — ABNORMAL HIGH (ref 8–23)
CHLORIDE: 105 mmol/L (ref 98–111)
CO2: 25 mmol/L (ref 22–32)
Calcium: 8.1 mg/dL — ABNORMAL LOW (ref 8.9–10.3)
Creatinine, Ser: 0.82 mg/dL (ref 0.44–1.00)
GFR calc Af Amer: >60 mL/min (ref >60)
GFR calc non Af Amer: >60 mL/min (ref >60)
Glucose, Bld: 155 mg/dL — ABNORMAL HIGH (ref 70–99)
POTASSIUM: 3.8 mmol/L (ref 3.5–5.1)
SODIUM: 137 mmol/L (ref 135–145)
Total Bilirubin: 1.3 mg/dL — ABNORMAL HIGH (ref 0.3–1.2)
Total Protein: 6.2 g/dL — ABNORMAL LOW (ref 6.5–8.1)

## 2018-02-19 LAB — MAGNESIUM: MAGNESIUM: 2.2 mg/dL (ref 1.7–2.4)

## 2018-02-19 LAB — PHOSPHORUS: Phosphorus: 3.9 mg/dL (ref 2.5–4.6)

## 2018-02-19 MED ORDER — DILTIAZEM HCL 30 MG PO TABS
30.0000 mg | ORAL_TABLET | Freq: Two times a day (BID) | ORAL | Status: DC
  Administered 2018-02-19 – 2018-02-21 (×5): 30 mg via ORAL
  Filled 2018-02-19 (×5): qty 1

## 2018-02-19 MED ORDER — SODIUM CHLORIDE 0.9 % IV SOLN
INTRAVENOUS | Status: DC | PRN
  Administered 2018-02-19 – 2018-02-23 (×6): 500 mL via INTRAVENOUS

## 2018-02-19 MED ORDER — RISPERIDONE 0.25 MG PO TABS
0.2500 mg | ORAL_TABLET | Freq: Every day | ORAL | Status: DC
  Administered 2018-02-19 – 2018-02-22 (×4): 0.25 mg via ORAL
  Filled 2018-02-19 (×5): qty 1

## 2018-02-19 MED ORDER — MAGNESIUM HYDROXIDE 400 MG/5ML PO SUSP: 30.0000 mL | Freq: Every day | ORAL | Status: DC | PRN

## 2018-02-19 MED ORDER — PAROXETINE HCL 10 MG PO TABS
5.0000 mg | ORAL_TABLET | Freq: Every day | ORAL | Status: AC
  Administered 2018-02-19 – 2018-02-21 (×3): 5 mg via ORAL
  Filled 2018-02-19 (×3): qty 0.5
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
## 2018-02-19 NOTE — Progress Notes (Signed)
Follow up - Critical Care Medicine Note  Patient Details:    Jaime [name] is an 84 y.o. female.with a past medical history remarkable for chronic back pain, anemia, blindness, gastroesophageal reflux disease, hypertension, lymphedema, was seen in the emergency department for her back pain and was noted to be hypoxemic and tachycardic.  She has an underlying history remarkable for COPD and she was admitted for further evaluation and management.  Per report she has had 2 days of worsening back pain.  She developed progressive hypoxemic respiratory failure.  She was given Solu-Medrol, Lasix, was transferred to the intensive care unit for progressive hypoxemia.   Lines, Airways, Drains: PICC Triple Lumen 02/18/18 PICC Right Basilic 37 cm 1 cm (Active)  Indication for Insertion or Continuance of Line Prolonged intravenous therapies;Limited venous access - need for IV therapy >5 days (PICC only) 02/19/2018  8:00 AM  Exposed Catheter (cm) 1 cm 02/18/2018  5:15 PM  Site Assessment Intact 02/19/2018  8:00 AM  Lumen #1 Status Flushed;Saline locked 02/19/2018  8:00 AM  Lumen #2 Status Flushed;Saline locked 02/19/2018  8:00 AM  Lumen #3 Status Flushed;Saline locked 02/19/2018  8:00 AM  Dressing Type Transparent;Securing device 02/19/2018  8:00 AM  Dressing Status Antimicrobial disc in place;Intact;Old drainage 02/19/2018  8:00 AM  Line Care Connections checked and tightened 02/19/2018  8:00 AM  Dressing Change Due 02/25/18 02/19/2018  8:00 AM     Urethral Catheter Jaime [name] Double-lumen;Non-latex 14 Fr. (Active)  Indication for Insertion or Continuance of Catheter Aggressive IV diuresis 02/19/2018  8:28 AM  Site Assessment Clean;Intact 02/19/2018  8:28 AM  Catheter Maintenance Bag below level of bladder;Catheter secured;Drainage bag/tubing not touching floor;Insertion date on drainage bag;No dependent loops;Seal intact;Bag emptied prior to transport 02/19/2018  8:28 AM  Collection Container  Standard drainage bag 02/19/2018  8:28 AM  Securement Method Securing device (Describe) 02/19/2018  2:00 AM  Output (mL) 120 mL 02/19/2018  8:00 AM    Anti-infectives:  Anti-infectives (From admission, onward)   Start     Dose/Rate Route Frequency Ordered Stop   02/18/18 1400  ceFEPIme (MAXIPIME) 1 g in sodium chloride 0.9 % 100 mL IVPB  Status:  Discontinued     1 g 200 mL/hr over 30 Minutes Intravenous Every 8 hours 02/18/18 1022 02/18/18 1027   02/18/18 1130  vancomycin (VANCOCIN) IVPB 1000 mg/200 mL premix     1,000 mg 200 mL/hr over 60 Minutes Intravenous  Once 02/18/18 1042 02/18/18 1346   02/18/18 1100  ceFEPIme (MAXIPIME) 1 g in sodium chloride 0.9 % 100 mL IVPB     1 g 200 mL/hr over 30 Minutes Intravenous Every 12 hours 02/18/18 1027        Microbiology: Results for orders placed or performed during the hospital encounter of 02/17/18  MRSA PCR Screening     Status: None   Collection Time: 02/18/18 10:33 AM  Result Value Ref Range Status   MRSA by PCR NEGATIVE NEGATIVE Final    Comment:        The GeneXpert MRSA Assay (FDA approved for NASAL specimens only), is one component of a comprehensive MRSA colonization surveillance program. It is not intended to diagnose MRSA infection nor to guide or monitor treatment for MRSA infections. Performed at [facility], [address] [place], [place] [address]      Studies: Dg Chest 1 View  Result Date: 02/18/2018 CLINICAL DATA:  Worsened shortness of breath since yesterday. EXAM: CHEST  1 VIEW COMPARISON:  02/17/2018  FINDINGS: Worsened interstitial edema and early alveolar edema pattern diffusely. Large hiatal hernia as seen previously. No consolidation. No visible effusion. IMPRESSION: Worsened interstitial and early alveolar edema pattern. Electronically Signed   By: Paulina Fusi M.D.   On: 02/18/2018 09:35   Ct T-spine No Charge  Result Date: 02/17/2018 CLINICAL DATA:  No known injury. Back pain in the  thoracic and lumbar region. EXAM: CT THORACIC AND LUMBAR SPINE WITHOUT CONTRAST TECHNIQUE: Multidetector CT imaging of the thoracic and lumbar spine was performed without contrast. Multiplanar CT image reconstructions were also generated. COMPARISON:  11/28/2015 CT abdomen. CT study of the neck done 06/19/2017 that includes the upper thoracic region. FINDINGS: CT THORACIC SPINE FINDINGS Alignment: Normal Vertebrae: No acute finding in the thoracic spine. There is old minor loss of height at T3. There are old superior endplate depression centrally which could be minimal fractures or Schmorl's nodes at the superior endplate of T10 and the superior endplate of T12. At the inferior endplate of T12, there is new conchae cavity which was not present in 2017. This therefore represents an interval compression deformity but the exact age is indeterminate by CT. Paraspinal and other soft tissues: See results of chest CT. Disc levels: No evidence of significant degenerative disc disease. There is ordinary mild facet osteoarthritis. No apparent compressive stenosis of the canal or foramina. CT LUMBAR SPINE FINDINGS Segmentation: 5 lumbar type vertebral bodies. Alignment: Curvature convex to the right in the thoracolumbar junction region and to the left in the lower lumbar region. Vertebrae: Old compression fracture at L4 which is unchanged since 2017, with loss of height of about 40%. No retropulsed bone. No other fracture or primary bone lesion. Discogenic endplate changes. Paraspinal and other soft tissues: See results of abdominal CT. Disc levels: Advanced degenerative disc disease at L1-2 and L2-3 with complete degeneration of the disc and vacuum phenomenon. Endplate osteophytes but no apparent herniated soft disc material. The L3-4 and L4-5 discs shown normal height, but do show bulging. At L5-S1, there is disc degeneration with mild loss of height and vacuum phenomenon. There is facet arthropathy throughout the lumbar  region that could be associated with back pain. There is no apparent compressive central canal stenosis. There is bilateral foraminal narrowing at L1-2 and L2-3 which is chronic. There appears to be mild to moderate multifactorial stenosis at L3-4 and L4-5. There is some foraminal narrowing at L5-S1. IMPRESSION: CT THORACIC SPINE IMPRESSION In the thoracic region, the only possibly recent finding is mild central inferior endplate depression at T12 which was not present on a CT scan of the abdomen in 2017. Therefore it is new since then, but the exact age is indeterminate. There is an old superior endplate Schmorl's node at T12. There does not appear to be any retropulsed bone. If the patient is painful in this region, this could be a new fracture. Old minimal compression deformities at T3 and affecting the superior endplate also at T10. CT LUMBAR SPINE IMPRESSION No sign of acute fracture in the lumbar region. Old compression deformity of L4 with loss of height of about 40%. Scoliosis and degenerative changes in the lumbar spine as outlined above. The degenerative changes are quite advanced and could certainly be associated with back pain. There is potential for foraminal nerve root compression at L1-2, L2-3 and L5-S1 and potential for central canal, lateral recess and foraminal narrowing at L3-4 and L4-5. Electronically Signed   By: Paulina Fusi M.D.   On: 02/17/2018 07:14  Ct L-spine No Charge  Result Date: 02/17/2018 CLINICAL DATA:  No known injury. Back pain in the thoracic and lumbar region. EXAM: CT THORACIC AND LUMBAR SPINE WITHOUT CONTRAST TECHNIQUE: Multidetector CT imaging of the thoracic and lumbar spine was performed without contrast. Multiplanar CT image reconstructions were also generated. COMPARISON:  11/28/2015 CT abdomen. CT study of the neck done 06/19/2017 that includes the upper thoracic region. FINDINGS: CT THORACIC SPINE FINDINGS Alignment: Normal Vertebrae: No acute finding in the  thoracic spine. There is old minor loss of height at T3. There are old superior endplate depression centrally which could be minimal fractures or Schmorl's nodes at the superior endplate of T10 and the superior endplate of T12. At the inferior endplate of T12, there is new conchae cavity which was not present in 2017. This therefore represents an interval compression deformity but the exact age is indeterminate by CT. Paraspinal and other soft tissues: See results of chest CT. Disc levels: No evidence of significant degenerative disc disease. There is ordinary mild facet osteoarthritis. No apparent compressive stenosis of the canal or foramina. CT LUMBAR SPINE FINDINGS Segmentation: 5 lumbar type vertebral bodies. Alignment: Curvature convex to the right in the thoracolumbar junction region and to the left in the lower lumbar region. Vertebrae: Old compression fracture at L4 which is unchanged since 2017, with loss of height of about 40%. No retropulsed bone. No other fracture or primary bone lesion. Discogenic endplate changes. Paraspinal and other soft tissues: See results of abdominal CT. Disc levels: Advanced degenerative disc disease at L1-2 and L2-3 with complete degeneration of the disc and vacuum phenomenon. Endplate osteophytes but no apparent herniated soft disc material. The L3-4 and L4-5 discs shown normal height, but do show bulging. At L5-S1, there is disc degeneration with mild loss of height and vacuum phenomenon. There is facet arthropathy throughout the lumbar region that could be associated with back pain. There is no apparent compressive central canal stenosis. There is bilateral foraminal narrowing at L1-2 and L2-3 which is chronic. There appears to be mild to moderate multifactorial stenosis at L3-4 and L4-5. There is some foraminal narrowing at L5-S1. IMPRESSION: CT THORACIC SPINE IMPRESSION In the thoracic region, the only possibly recent finding is mild central inferior endplate depression  at T12 which was not present on a CT scan of the abdomen in 2017. Therefore it is new since then, but the exact age is indeterminate. There is an old superior endplate Schmorl's node at T12. There does not appear to be any retropulsed bone. If the patient is painful in this region, this could be a new fracture. Old minimal compression deformities at T3 and affecting the superior endplate also at T10. CT LUMBAR SPINE IMPRESSION No sign of acute fracture in the lumbar region. Old compression deformity of L4 with loss of height of about 40%. Scoliosis and degenerative changes in the lumbar spine as outlined above. The degenerative changes are quite advanced and could certainly be associated with back pain. There is potential for foraminal nerve root compression at L1-2, L2-3 and L5-S1 and potential for central canal, lateral recess and foraminal narrowing at L3-4 and L4-5. Electronically Signed   By: Paulina Fusi M.D.   On: 02/17/2018 07:14   Dg Chest Port 1 View  Result Date: 02/18/2018 CLINICAL DATA:  PICC line placement. EXAM: PORTABLE CHEST 1 VIEW COMPARISON:  Radiograph of same day. FINDINGS: Stable cardiomegaly and central pulmonary vascular congestion. No pneumothorax is noted. Right lung is clear. Mild left pleural  effusion is noted with possible associated atelectasis. Right-sided PICC line is noted with distal tip in expected position of the SVC. Bony thorax is unremarkable. IMPRESSION: Interval placement of right-sided PICC line with distal tip in expected position of the SVC. Mild left pleural effusion is noted. Electronically Signed   By: Lupita Raider, M.D.   On: 02/18/2018 17:36   Dg Chest Port 1 View  Result Date: 02/17/2018 CLINICAL DATA:  Back pain radiating to the abdomen. Wheezing. EXAM: PORTABLE CHEST 1 VIEW COMPARISON:  01/19/2018 FINDINGS: Cardiac enlargement. Lungs are clear. No blunting of costophrenic angles. No pneumothorax. Bilateral breast implants. Scattered metallic foreign  bodies in the soft tissues. No change since prior study. Calcification of the aorta. Degenerative changes in the spine and shoulders. IMPRESSION: Cardiac enlargement. No evidence of active pulmonary disease. Electronically Signed   By: Burman Nieves M.D.   On: 02/17/2018 05:43   Ct Angio Chest/abd/pel For Dissection W And/or Wo Contrast  Result Date: 02/17/2018 CLINICAL DATA:  Back pain radiating to the abdomen. Wheezing. No injury. EXAM: CT ANGIOGRAPHY CHEST, ABDOMEN AND PELVIS TECHNIQUE: Multidetector CT imaging through the chest, abdomen and pelvis was performed using the standard protocol during bolus administration of intravenous contrast. Multiplanar reconstructed images and MIPs were obtained and reviewed to evaluate the vascular anatomy. CONTRAST:  ISOVUE-370 IOPAMIDOL (ISOVUE-370) INJECTION 76% COMPARISON:  CT abdomen and pelvis 11/28/2015 FINDINGS: CTA CHEST FINDINGS Cardiovascular: Noncontrast CT images of the chest demonstrate no evidence of intramural hematoma. Aortic and coronary artery calcifications. Calcified mediastinal lymph nodes. Calcified granulomas in the lungs, liver, and spleen. Peripherally calcified breast implants bilaterally. Images obtained during arterial phase after contrast administration demonstrate normal caliber thoracic aorta. No aortic dissection or aneurysm. Great vessel origins are patent. Central pulmonary arteries are patent without evidence of significant pulmonary embolus. Normal heart size. No pericardial effusions. Mediastinum/Nodes: Esophagus is decompressed. Large esophageal hiatal hernia behind the heart containing most of the stomach. Surgical clips in the base of the neck. Lungs/Pleura: Atelectasis in the lung bases. No consolidation or airspace disease. No pleural effusions. No pneumothorax. Musculoskeletal: Degenerative changes in the spine and shoulders. Schmorl's nodes at multiple levels. Old appearing left posterior rib fractures. Review of the MIP  images confirms the above findings. CTA ABDOMEN AND PELVIS FINDINGS VASCULAR Aorta: Normal caliber aorta without aneurysm, dissection, vasculitis or significant stenosis. Aortic calcification. Celiac: Patent without evidence of aneurysm, dissection, vasculitis or significant stenosis. SMA: Patent without evidence of aneurysm, dissection, vasculitis or significant stenosis. Renals: Both renal arteries are patent without evidence of aneurysm, dissection, vasculitis, fibromuscular dysplasia or significant stenosis. IMA: Patent without evidence of aneurysm, dissection, vasculitis or significant stenosis. Inflow: Patent without evidence of aneurysm, dissection, vasculitis or significant stenosis. Veins: No obvious venous abnormality within the limitations of this arterial phase study. Review of the MIP images confirms the above findings. NON-VASCULAR Hepatobiliary: No focal liver abnormality is seen. Status post cholecystectomy. No biliary dilatation. Pancreas: Fatty infiltration of the pancreas. No acute inflammatory changes. Spleen: Normal in size without focal abnormality. Adrenals/Urinary Tract: Adrenal glands are unremarkable. Kidneys are normal, without renal calculi, focal lesion, or hydronephrosis. Bladder is unremarkable. Stomach/Bowel: Stomach is within normal limits. Appendix appears normal. No evidence of bowel wall thickening, distention, or inflammatory changes. Lymphatic: No significant lymphadenopathy. Reproductive: Uterus and bilateral adnexa are unremarkable. Other: No free air or free fluid in the abdomen. Abdominal wall musculature appears intact. Musculoskeletal: Lumbar scoliosis convex towards the right. Compression deformities of T12 and L4. Degenerative changes  throughout the lumbar spine. No destructive bone lesions. Review of the MIP images confirms the above findings. IMPRESSION: No evidence of aneurysm or dissection of the thoracic or abdominal aorta. No evidence of active pulmonary disease.  No acute process demonstrated in the abdomen or pelvis. No evidence of bowel obstruction or inflammation. Large esophageal hiatal hernia. Aortic Atherosclerosis (ICD10-I70.0). Electronically Signed   By: Burman Nieves M.D.   On: 02/17/2018 06:59   [place] Ekg Site Rite  Result Date: 02/18/2018 If Site Rite image not attached, placement could not be confirmed due to current cardiac rhythm.   Consults: Treatment Team:  Clapacs, Jackquline [place], MD   Subjective:    Overnight Issues: Patient did well overnight.  Has been weaned off of BiPAP presently on nasal cannula, stable hemodynamics with ventricular response in the 80s  Objective:  Vital signs for last 24 hours: Temp:  [98 F (36.7 C)-100 F (37.8 C)] 98.7 F (37.1 C) (11/10 0800) Pulse Rate:  [71-153] 91 (11/10 0800) Resp:  [16-38] 21 (11/10 0800) BP: (69-175)/(44-89) 156/89 (11/10 0800) SpO2:  [89 %-100 %] 93 % (11/10 0800) FiO2 (%):  [35 %] 35 % (11/09 1408) Weight:  [100.1 kg-100.7 kg] 100.1 kg (11/10 0405)  Hemodynamic parameters for last 24 hours:    Intake/Output from previous day: 11/09 0701 - 11/10 0700 In: 1235.4 [I.V.:356.8; IV Piggyback:878.6] Out: 1750 [Urine:1750]  Intake/Output this shift: Total I/O In: -  Out: 120 [Urine:120]  Vent settings for last 24 hours: FiO2 (%):  [35 %] 35 %  Physical Exam:  Patient is awake, alert, oriented in no acute distress this morning HEENT:           Patient has a facemask in place, no jugular venous distention noted, trachea is midline Cardiovascular:            Irregularly irregular rhythm with controlled ventricular response Pulmonary:       Mild expiratory wheeze on the right, significantly improved from yesterday Abdominal exam positive bowel sounds, soft exam Extremities:     1+ edema noted, scarring on the left knee appreciated Neurologic:       Patient is blind, wears hearing aids, no focal motor deficits noted  Assessment/Plan:  Respiratory failure.    Pulmonary edema  and bronchospasm have significantly improved.  Patient has been weaned off BiPAP presently on nasal cannula, 500 cc diuresis, was also started on Solu-Medrol and bronchodilators along cefepime.    Atrial fibrillation with rapid ventricular response.  Ventricular response now is under control, has been started on Cardizem 30 mg every 12  Leukocytosis.  Cefepime, ending cultures  Anemia.  No evidence of active bleeding. Has had a decrease in hemoglobin to 7.3, no clear loss noted, will repeat CBC later on today  Chronic low back pain.    Patient was weaned off of Precedex, presently not complaining of back pain  Delusional disorder.  Was seen by psychiatry this morning appreciate their input and will follow all recommendations   Doing well today, stable for floor transfer  Jaime [name] 02/19/2018  *Care during the described time interval was provided by me and/or other providers on the critical care team.  I have reviewed this patient's available data, including medical history, events of note, physical examination and test results as part of my evaluation.
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
## 2018-02-19 NOTE — Progress Notes (Signed)
[facility] Physicians -  at [facility]   PATIENT NAME: Jaime [name]    MR#:  [id number]  DATE OF BIRTH:  15-Apr-1927  SUBJECTIVE: Since seen in ICU, off the BiPAP, appears slightly more comfortable with breathing but has back pain.  CHIEF COMPLAINT:   Chief Complaint  Patient presents with  . Back Pain    REVIEW OF SYSTEMS:   ROS CONSTITUTIONAL: No fever, fatigue or weakness.  EYES: Legally blind.  EARS, NOSE, AND THROAT: No tinnitus or ear pain.  RESPIRATORY: Shortness of breath, cough.  CARDIOVASCULAR: No chest pain, orthopnea, edema.  GASTROINTESTINAL: No nausea, vomiting, diarrhea or abdominal pain.  GENITOURINARY: No dysuria, hematuria.  ENDOCRINE: No polyuria, nocturia,  HEMATOLOGY: No anemia, easy bruising or bleeding SKIN: No rash or lesion. MUSCULOSKELETAL: Severe low back pain. NEUROLOGIC: No tingling, numbness, weakness.  PSYCHIATRY: Anxious.,  Paranoid delusions DRUG ALLERGIES:   Allergies  Allergen Reactions  . Codeine Nausea And Vomiting    Pt states she can't take oxycodone.  She states she can take Percocet.  . Levofloxacin Other (See Comments)  . Oxycodone Nausea And Vomiting  . Tramadol Hcl Anxiety    VITALS:  Blood pressure (!) 163/85, pulse 94, temperature 98.7 F (37.1 C), resp. rate 18, height 5\' 2"  (1.575 m), weight 100.1 kg, SpO2 95 %.  PHYSICAL EXAMINATION:  GENERAL:  84 y.o.-year-old patient lying in the bed with obvious respiratory distress, using accessory muscles of respiration. EYES: Pupils equal, round, reactive to light . No scleral icterus. Extraocular muscles intact.  HEENT: Head atraumatic, normocephalic. Oropharynx and nasopharynx clear.  NECK:  Supple, no jugular venous distention. No thyroid enlargement, no tenderness.  LUNGS: Bilateral coarse breath sounds present, basilar crepitus present. CARDIOVASCULAR: S1, S2 tachycardic. No murmurs, rubs, or gallops.  ABDOMEN: Soft, nontender, nondistended.  Bowel sounds present. No organomegaly or mass.  EXTREMITIES: No pedal edema, cyanosis, or clubbing.  NEUROLOGIC: Patient is unstable unable to do full neuro exam because she is in respiratory distress.  But no gross neurological deficit is observed.Marland Kitchen  PSYCHIATRIC: The patient is alert and oriented x 3.  SKIN: No obvious rash, lesion, or ulcer.    LABORATORY PANEL:   CBC Recent Labs  Lab 02/19/18 0503  WBC 17.5*  HGB 7.3*  HCT 23.8*  PLT 218   ------------------------------------------------------------------------------------------------------------------  Chemistries  Recent Labs  Lab 02/19/18 0503  NA 137  K 3.8  CL 105  CO2 25  GLUCOSE 155*  BUN 31*  CREATININE 0.82  CALCIUM 8.1*  MG 2.2  AST 21  ALT 20  ALKPHOS 47  BILITOT 1.3*   ------------------------------------------------------------------------------------------------------------------  Cardiac Enzymes Recent Labs  Lab 02/18/18 0934  TROPONINI <0.03   ------------------------------------------------------------------------------------------------------------------  RADIOLOGY:  Dg Chest 1 View  Result Date: 02/18/2018 CLINICAL DATA:  Worsened shortness of breath since yesterday. EXAM: CHEST  1 VIEW COMPARISON:  02/17/2018 FINDINGS: Worsened interstitial edema and early alveolar edema pattern diffusely. Large hiatal hernia as seen previously. No consolidation. No visible effusion. IMPRESSION: Worsened interstitial and early alveolar edema pattern. Electronically Signed   By: Paulina Fusi M.D.   On: 02/18/2018 09:35   Dg Abd 1 View  Result Date: 02/19/2018 CLINICAL DATA:  Constipation.  Known L4 compression fracture. EXAM: ABDOMEN - 1 VIEW COMPARISON:  CT, 02/17/2018 FINDINGS: There is no bowel dilation to suggest obstruction. Mild generalized increased stool burden is noted in the colon. Status post cholecystectomy. No evidence of renal or ureteral stones. Scattered vascular calcifications. Soft  tissues otherwise unremarkable.  L4 compression fractures unchanged from the prior exam. IMPRESSION: 1. No obstruction or acute finding. 2. Mild generalized increased colonic stool burden. Electronically Signed   By: Amie Portland M.D.   On: 02/19/2018 10:52   Dg Chest Port 1 View  Result Date: 02/18/2018 CLINICAL DATA:  PICC line placement. EXAM: PORTABLE CHEST 1 VIEW COMPARISON:  Radiograph of same day. FINDINGS: Stable cardiomegaly and central pulmonary vascular congestion. No pneumothorax is noted. Right lung is clear. Mild left pleural effusion is noted with possible associated atelectasis. Right-sided PICC line is noted with distal tip in expected position of the SVC. Bony thorax is unremarkable. IMPRESSION: Interval placement of right-sided PICC line with distal tip in expected position of the SVC. Mild left pleural effusion is noted. Electronically Signed   By: Lupita Raider, M.D.   On: 02/18/2018 17:36   [place] Ekg Site Rite  Result Date: 02/18/2018 If Site Rite image not attached, placement could not be confirmed due to current cardiac rhythm.   EKG:   Orders placed or performed during the hospital encounter of 02/17/18  . ED EKG  . ED EKG  . EKG 12-Lead  . EKG 12-Lead    ASSESSMENT AND PLAN:   84 year old female with history of COPD comes in because of severe low back pain and found to have L4 compression fracture 1.  Severe acute on chronic low back pain with L4 compression fracture and with 40% height loss, obtain orthopedic consult.  Continue pain medicines, DVT prophylaxis. 2.  Acute respiratory failure with hypoxia likely due to COPD exacerbation, CHF exacerbation: Clinically improved off the BiPAP, on 2 L of oxygen, continue IV steroids, bronchodilators  .  #3 atrial fibrillation with RVR, started on Cardizem p.o. 4.  Paranoid delusions, major depression with recurrent psychosis: Seen by psychiatry Dr. Edwin Dada, recommended to taper off Paxil, starting Lexapro, Risperdal, will  check B12 levels. Prognosis is poor,, condition guarded, discussed with husband.  All the records are reviewed and case discussed with Care Management/Social Workerr. Management plans discussed with the patient, family and they are in agreement.  CODE STATUS: Full code  TOTAL TIME TAKING CARE OF THIS PATIENT: 32 minutes.   More than 50% time spent in counseling, coordination of care Discussed with patient's husband   Katha Hamming M.D on 02/19/2018 at 1:14 PM  Between 7am to 6pm - Pager - [phone]  After 6pm go to [email or web address] - password [email or web address] [facility]  [facility]  Office  [phone]  CC: Primary care physician; Erasmo Downer, MD   Note: This dictation was prepared with Dragon dictation along with smaller phrase technology. Any transcriptional errors that result from this process are unintentional.

## 2018-02-19 NOTE — Progress Notes (Signed)
1700 Transferred to 2 A via bed. Foley d/ced per patient request. Report called to Darl Pikes RN on 2 A at 1630. All clothes (2 bags) Bilateral hearing aides, Hearing aid charger, and purse taken to room with patient.

## 2018-02-20 ENCOUNTER — Inpatient Hospital Stay: Payer: Medicare Other

## 2018-02-20 LAB — GLUCOSE, CAPILLARY: Glucose-Capillary: 153 mg/dL — ABNORMAL HIGH (ref 70–99)

## 2018-02-20 LAB — VITAMIN B12: Vitamin B-12: 1183 pg/mL — ABNORMAL HIGH (ref 180–914)

## 2018-02-20 MED ORDER — TECHNETIUM TC 99M MEDRONATE IV KIT
22.0000 | PACK | Freq: Once | INTRAVENOUS | Status: AC | PRN
  Administered 2018-02-20: 22.359 via INTRAVENOUS

## 2018-02-20 NOTE — Progress Notes (Signed)
SATURATION QUALIFICATIONS: (This note is used to comply with regulatory documentation for home oxygen)  Patient Saturations on Room Air at Rest = 90%  Patient Saturations on Room Air while Ambulating = 87%  Patient Saturations on 2 Liters of oxygen while Ambulating = 92%  Please briefly explain why patient needs home oxygen: 
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
## 2018-02-20 NOTE — Progress Notes (Signed)
[facility] - [place] at [facility]   PATIENT NAME: Jaime [name]    MR#:  [id number]  DATE OF BIRTH:  1927/08/31  SUBJECTIVE: Since seen in ICU, off the BiPAP, appears slightly more comfortable with breathing but has back pain.  CHIEF COMPLAINT:   Chief Complaint  Patient presents with  . Back Pain  would like to talk with Ortho for her options for Back, husband at bedside, reports constipation REVIEW OF SYSTEMS:   ROS CONSTITUTIONAL: No fever, fatigue or weakness.  EYES: Legally blind.  EARS, NOSE, AND THROAT: No tinnitus or ear pain.  RESPIRATORY: Shortness of breath, cough.  CARDIOVASCULAR: No chest pain, orthopnea, edema.  GASTROINTESTINAL: No nausea, vomiting, diarrhea or abdominal pain. Has constipation GENITOURINARY: No dysuria, hematuria.  ENDOCRINE: No polyuria, nocturia,  HEMATOLOGY: No anemia, easy bruising or bleeding SKIN: No rash or lesion. MUSCULOSKELETAL: Severe low back pain. NEUROLOGIC: No tingling, numbness, weakness.  PSYCHIATRY: Anxious.,  Paranoid delusions DRUG ALLERGIES:   Allergies  Allergen Reactions  . Codeine Nausea And Vomiting    Pt states she can't take oxycodone.  She states she can take Percocet.  . Levofloxacin Other (See Comments)  . Oxycodone Nausea And Vomiting  . Tramadol Hcl Anxiety    VITALS:  Blood pressure (!) 159/76, pulse 92, temperature (!) 97.5 F (36.4 C), temperature source Oral, resp. rate 17, height 5\' 2"  (1.575 m), weight 94.2 kg, SpO2 96 %.  PHYSICAL EXAMINATION:  GENERAL:  84 y.o.-year-old patient lying in the bed with obvious respiratory distress, using accessory muscles of respiration. EYES: Pupils equal, round, reactive to light . No scleral icterus. Extraocular muscles intact.  HEENT: Head atraumatic, normocephalic. Oropharynx and nasopharynx clear.  NECK:  Supple, no jugular venous distention. No thyroid enlargement, no tenderness.  LUNGS: Bilateral coarse breath sounds present,  basilar crepitus present. CARDIOVASCULAR: S1, S2 tachycardic. No murmurs, rubs, or gallops.  ABDOMEN: Soft, nontender, nondistended. Bowel sounds present. No organomegaly or mass.  EXTREMITIES: No pedal edema, cyanosis, or clubbing.  NEUROLOGIC: Patient is unstable unable to do full neuro exam because she is in respiratory distress.  But no gross neurological deficit is observed.Marland Kitchen  PSYCHIATRIC: The patient is alert and oriented x 3.  SKIN: No obvious rash, lesion, or ulcer. LABORATORY PANEL:   CBC Recent Labs  Lab 02/19/18 0503  WBC 17.5*  HGB 7.3*  HCT 23.8*  PLT 218   ------------------------------------------------------------------------------------------------------------------  Chemistries  Recent Labs  Lab 02/19/18 0503  NA 137  K 3.8  CL 105  CO2 25  GLUCOSE 155*  BUN 31*  CREATININE 0.82  CALCIUM 8.1*  MG 2.2  AST 21  ALT 20  ALKPHOS 47  BILITOT 1.3*   ------------------------------------------------------------------------------------------------------------------  Cardiac Enzymes Recent Labs  Lab 02/18/18 0934  TROPONINI <0.03   ------------------------------------------------------------------------------------------------------------------  RADIOLOGY:  Dg Abd 1 View  Result Date: 02/19/2018 CLINICAL DATA:  Constipation.  Known L4 compression fracture. EXAM: ABDOMEN - 1 VIEW COMPARISON:  CT, 02/17/2018 FINDINGS: There is no bowel dilation to suggest obstruction. Mild generalized increased stool burden is noted in the colon. Status post cholecystectomy. No evidence of renal or ureteral stones. Scattered vascular calcifications. Soft tissues otherwise unremarkable. L4 compression fractures unchanged from the prior exam. IMPRESSION: 1. No obstruction or acute finding. 2. Mild generalized increased colonic stool burden. Electronically Signed   By: Amie Portland M.D.   On: 02/19/2018 10:52   Nm Bone W/spect  Result Date: 02/20/2018 CLINICAL DATA:  New  onset low back pain. Fall  1 week ago. Concern for acute fracture EXAM: NM BONE SCAN AND SPECT IMAGING TECHNIQUE: After intravenous injection of radiopharmaceutical, delayed planar images were obtained in multiple projections. Additionally, delayed triplanar SPECT images were obtained through the area of interest. RADIOPHARMACEUTICALS:  22.4 mCi Tc-33m MDP COMPARISON:  CT lumbar spine 02/17/2018 FINDINGS: There is intense uptake along the LEFT margin of the lumbar spine from the L1-L3. This activity associated with focal the scoliosis and osteophytosis. No evidence of uptake to suggest acute fracture. Additional uptake noted on the LEFT at L5-S1 associated with the facet hypertrophy Incidental finding of bilateral pleural effusions and basilar atelectasis. IMPRESSION: 1. No evidence of acute lumbar spine fracture. 2. Degenerative osteophytosis from L1 to L3  on the LEFT. 3. Degenerate facet arthropathy arthropathy at L5-S1 on the LEFT. Electronically Signed   By: Genevive Bi M.D.   On: 02/20/2018 14:57   Dg Chest Port 1 View  Result Date: 02/18/2018 CLINICAL DATA:  PICC line placement. EXAM: PORTABLE CHEST 1 VIEW COMPARISON:  Radiograph of same day. FINDINGS: Stable cardiomegaly and central pulmonary vascular congestion. No pneumothorax is noted. Right lung is clear. Mild left pleural effusion is noted with possible associated atelectasis. Right-sided PICC line is noted with distal tip in expected position of the SVC. Bony thorax is unremarkable. IMPRESSION: Interval placement of right-sided PICC line with distal tip in expected position of the SVC. Mild left pleural effusion is noted. Electronically Signed   By: Lupita Raider, M.D.   On: 02/18/2018 17:36    EKG:   Orders placed or performed during the hospital encounter of 02/17/18  . ED EKG  . ED EKG  . EKG 12-Lead  . EKG 12-Lead    ASSESSMENT AND PLAN:  84 year old female with history of COPD comes in because of severe low back pain and  found to have L4 compression fracture  1.  Severe acute on chronic low back pain with L4 compression fracture and with 40% height loss, orthopedic deciding on Kyphoplasty  Continue pain medicines, DVT prophylaxis.  2.  Acute respiratory failure with hypoxia likely due to COPD exacerbation, CHF exacerbation: Clinically improved off the BiPAP, on 2 L of oxygen, continue IV steroids, bronchodilators   .  #3 atrial fibrillation with RVR, continue Cardizem p.o.  4.  Paranoid delusions, major depression with recurrent psychosis: Seen by psychiatry Dr. Edwin Dada, recommended to taper off Paxil, starting Lexapro, Risperdal,  Prognosis is poor,, condition guarded, discussed with husband.  Palliative care to f/up to see if she would be agreeable to DNR.   All the records are reviewed and case discussed with Care Management/Social Workerr. Management plans discussed with the patient, family (husband at bedside) and they are in agreement.  CODE STATUS: Full code  TOTAL TIME TAKING CARE OF THIS PATIENT: 35 minutes.   More than 50% time spent in counseling, coordination of care Discussed with patient's husband   Delfino Lovett M.D on 02/20/2018 at 3:01 PM  Between 7am to 6pm - Pager - [phone]  After 6pm go to [email or web address] - password [email or web address] [facility]  [facility]  Office  [phone]  CC: Primary care physician; Erasmo Downer, MD   Note: This dictation was prepared with Dragon dictation along with smaller phrase technology. Any transcriptional errors that result from this process are unintentional.

## 2018-02-20 NOTE — Plan of Care (Signed)
Patient ambulated with walker, 1 assist to BR, tolerated well. O2 sats 88% on room air with exertion, currently remains on 2L, DOE. HR up into 140's with exertion but down to 90's when placed back in bed. Will continue to assess and monitor.  Problem: Activity: Goal: Risk for activity intolerance will decrease Outcome: Progressing   Problem: Elimination: Goal: Will not experience complications related to bowel motility Outcome: Progressing Goal: Will not experience complications related to urinary retention Outcome: Progressing   Problem: Safety: Goal: Ability to remain free from injury will improve Outcome: Progressing
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
## 2018-02-20 NOTE — Plan of Care (Signed)
  Problem: Activity: Goal: Risk for activity intolerance will decrease Outcome: Progressing Note:  Up to [facility] with one assist   Problem: Nutrition: Goal: Adequate nutrition will be maintained Outcome: Progressing   Problem: Coping: Goal: Level of anxiety will decrease Outcome: Progressing   Problem: Elimination: Goal: Will not experience complications related to urinary retention Outcome: Progressing Note:  Foley catheter was pulled before shift change, pt did urinate afterwards   Problem: Pain Managment: Goal: General experience of comfort will improve Outcome: Progressing   Problem: Safety: Goal: Ability to remain free from injury will improve Outcome: Progressing

## 2018-02-21 ENCOUNTER — Other Ambulatory Visit: Payer: Self-pay

## 2018-02-21 DIAGNOSIS — I480 Paroxysmal atrial fibrillation: Secondary | ICD-10-CM | POA: Insufficient documentation

## 2018-02-21 DIAGNOSIS — J9601 Acute respiratory failure with hypoxia: Secondary | ICD-10-CM

## 2018-02-21 LAB — CBC
HEMATOCRIT: 22.1 % — AB (ref 36.0–46.0)
HEMOGLOBIN: 7 g/dL — AB (ref 12.0–15.0)
MCH: 32.6 pg (ref 26.0–34.0)
MCHC: 31.7 g/dL (ref 30.0–36.0)
MCV: 102.8 fL — AB (ref 80.0–100.0)
Platelets: 209 10*3/uL (ref 150–400)
RBC: 2.15 MIL/uL — ABNORMAL LOW (ref 3.87–5.11)
RDW: 26.1 % — AB (ref 11.5–15.5)
WBC: 7.3 10*3/uL (ref 4.0–10.5)
nRBC: 0.7 % — ABNORMAL HIGH (ref 0.0–0.2)

## 2018-02-21 LAB — BASIC METABOLIC PANEL
ANION GAP: 9 (ref 5–15)
BUN: 35 mg/dL — AB (ref 8–23)
CHLORIDE: 103 mmol/L (ref 98–111)
CO2: 29 mmol/L (ref 22–32)
CREATININE: 0.67 mg/dL (ref 0.44–1.00)
Calcium: 8.5 mg/dL — ABNORMAL LOW (ref 8.9–10.3)
GFR calc Af Amer: >60 mL/min (ref >60)
GFR calc non Af Amer: >60 mL/min (ref >60)
GLUCOSE: 262 mg/dL — AB (ref 70–99)
Potassium: 3.9 mmol/L (ref 3.5–5.1)
Sodium: 141 mmol/L (ref 135–145)

## 2018-02-21 LAB — ABO/RH: ABO/RH(D): A NEG

## 2018-02-21 LAB — GLUCOSE, CAPILLARY
GLUCOSE-CAPILLARY: 338 mg/dL — AB (ref 70–99)
Glucose-Capillary: 239 mg/dL — ABNORMAL HIGH (ref 70–99)
Glucose-Capillary: 348 mg/dL — ABNORMAL HIGH (ref 70–99)

## 2018-02-21 LAB — HEMOGLOBIN A1C
Hgb A1c MFr Bld: 5.4 % (ref 4.8–5.6)
MEAN PLASMA GLUCOSE: 108.28 mg/dL

## 2018-02-21 LAB — PREPARE RBC (CROSSMATCH)

## 2018-02-21 MED ORDER — INSULIN ASPART 100 UNIT/ML ~~LOC~~ SOLN
0.0000 [IU] | Freq: Three times a day (TID) | SUBCUTANEOUS | Status: DC
  Administered 2018-02-21 – 2018-02-22 (×2): 7 [IU] via SUBCUTANEOUS
  Administered 2018-02-22: 3 [IU] via SUBCUTANEOUS
  Administered 2018-02-22 – 2018-02-23 (×4): 5 [IU] via SUBCUTANEOUS
  Administered 2018-02-24: 7 [IU] via SUBCUTANEOUS
  Administered 2018-02-24 – 2018-02-25 (×4): 5 [IU] via SUBCUTANEOUS
  Administered 2018-02-25: 7 [IU] via SUBCUTANEOUS
  Administered 2018-02-26 (×2): 5 [IU] via SUBCUTANEOUS
  Filled 2018-02-21 (×15): qty 1

## 2018-02-21 MED ORDER — SODIUM CHLORIDE 0.9% IV SOLUTION
Freq: Once | INTRAVENOUS | Status: AC
  Administered 2018-02-21: 15:00:00 via INTRAVENOUS

## 2018-02-21 MED ORDER — SODIUM CHLORIDE 0.9% FLUSH
10.0000 mL | INTRAVENOUS | Status: DC | PRN
  Administered 2018-02-21: 10 mL
  Administered 2018-02-21: 40 mL
  Administered 2018-02-22 – 2018-02-25 (×4): 3 mL
  Filled 2018-02-21 (×5): qty 40

## 2018-02-21 MED ORDER — INSULIN ASPART 100 UNIT/ML ~~LOC~~ SOLN
0.0000 [IU] | Freq: Every day | SUBCUTANEOUS | Status: DC
  Administered 2018-02-21 – 2018-02-23 (×3): 4 [IU] via SUBCUTANEOUS
  Administered 2018-02-24: 2 [IU] via SUBCUTANEOUS
  Filled 2018-02-21 (×4): qty 1

## 2018-02-21 MED ORDER — DILTIAZEM HCL 30 MG PO TABS
30.0000 mg | ORAL_TABLET | Freq: Four times a day (QID) | ORAL | Status: DC
  Administered 2018-02-21 – 2018-02-23 (×7): 30 mg via ORAL
  Filled 2018-02-21 (×7): qty 1
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
## 2018-02-21 NOTE — Progress Notes (Signed)
Subjective: No new complaints.  Still notes lower back pain radiating into both buttock regions with attempted standing or ambulation.   Objective: Vital signs in last 24 hours: Temp:  [98 F (36.7 C)-98.6 F (37 C)] 98 F (36.7 C) (11/12 0433) Pulse Rate:  [87-135] 135 (11/12 0937) Resp:  [17-22] 22 (11/12 0937) BP: (155-173)/(87-90) 155/88 (11/12 0937) SpO2:  [91 %-96 %] 95 % (11/12 0937) Weight:  [94.2 kg] 94.2 kg (11/12 0433)  Intake/Output from previous day: 11/11 0701 - 11/12 0700 In: 244.3 [I.V.:44.3; IV Piggyback:200] Out: 700 [Urine:700] Intake/Output this shift: Total I/O In: -  Out: 700 [Urine:700]  Recent Labs    02/19/18 0503 02/21/18 0619  HGB 7.3* 7.0*   Recent Labs    02/19/18 0503 02/21/18 0619  WBC 17.5* 7.3  RBC 2.29* 2.15*  HCT 23.8* 22.1*  PLT 218 209   Recent Labs    02/19/18 0503 02/21/18 0619  NA 137 141  K 3.8 3.9  CL 105 103  CO2 25 29  BUN 31* 35*  CREATININE 0.82 0.67  GLUCOSE 155* 262*  CALCIUM 8.1* 8.5*   No results for input(s): LABPT, INR in the last 72 hours.  Physical Exam: Orthopedic examination is unchanged as compared to yesterday's findings.  Patient remains neurovascularly intact to both lower extremities.  X-rays: The bone scan has been reviewed by myself.  By report, the scan demonstrates no evidence for any acute bony pathology in the lower thoracic or lumbar spine.  There is evidence of some increased uptake in the lower spine due to facet arthropathy on the left at L5-S1.  Assessment: Advanced lumbar degenerative disc disease with facet arthropathy L5-S1.  Plan: Given that there are no acute compression fractures identified by the SPECT scan, there is no need for any kyphoplasty procedure.  Therefore, the patient will need to be managed with pain control, progressive mobilization, and physical therapy.  As mentioned yesterday, the patient may benefit from a lumbosacral corset to wear when out of bed to see if  this improves her symptoms.  In addition, the patient may benefit from a lumbar epidural steroid injection or lumbar facet blocks.  This could be arranged through the [facility].  Thank you for asked me to participate in the care of this most pleasant yet unfortunate woman.  I will sign off at this time.  If you need any further orthopedic input, please contact me again.   Excell SeltzerJohn J  02/21/2018, 10:13 AM

## 2018-02-21 NOTE — Progress Notes (Signed)
Patients HR up into 140's, Afib RVR, patient currently lying in bed, notified MD, orders placed for cardio consult and echo, will continue to monitor.

## 2018-02-21 NOTE — Progress Notes (Addendum)
[facility] [place] [facility]   PATIENT NAME: Jaime [name]    MR#:  [id number]  DATE OF BIRTH:  1927-08-08  SUBJECTIVE: Since seen in ICU, off the BiPAP, appears slightly more comfortable with breathing but has back pain.  CHIEF COMPLAINT:   Chief Complaint  Patient presents with  . Back Pain  HR in 140s reported by nursing. Patient feels tired and has back pain, Hb 7.0, not very happy about seeing too many doctors and being on too many meds REVIEW OF SYSTEMS:   ROS CONSTITUTIONAL: No fever, fatigue or weakness.  EYES: Legally blind.  EARS, NOSE, AND THROAT: No tinnitus or ear pain.  RESPIRATORY: Shortness of breath, cough.  CARDIOVASCULAR: No chest pain, orthopnea, edema.  GASTROINTESTINAL: No nausea, vomiting, diarrhea or abdominal pain.  GENITOURINARY: No dysuria, hematuria.  ENDOCRINE: No polyuria, nocturia,  HEMATOLOGY: No anemia, easy bruising or bleeding SKIN: No rash or lesion. MUSCULOSKELETAL: Severe low back pain. NEUROLOGIC: No tingling, numbness, weakness.  PSYCHIATRY: Anxious.,  Paranoid delusions DRUG ALLERGIES:   Allergies  Allergen Reactions  . Codeine Nausea And Vomiting    Pt states she can't take oxycodone.  She states she can take Percocet.  . Levofloxacin Other (See Comments)  . Oxycodone Nausea And Vomiting  . Tramadol Hcl Anxiety    VITALS:  Blood pressure (!) 176/78, pulse 87, temperature 98.4 F (36.9 C), resp. rate 18, height 5\' 2"  (1.575 m), weight 94.2 kg, SpO2 97 %.  PHYSICAL EXAMINATION:  GENERAL:  84 y.o.-year-old patient lying in the bed with obvious respiratory distress, using accessory muscles of respiration. EYES: Pupils equal, round, reactive to light . No scleral icterus. Extraocular muscles intact.  HEENT: Head atraumatic, normocephalic. Oropharynx and nasopharynx clear.  NECK:  Supple, no jugular venous distention. No thyroid enlargement, no tenderness.  LUNGS: Bilateral coarse breath sounds  present, basilar crepitus present. CARDIOVASCULAR: S1, S2 tachycardic. No murmurs, rubs, or gallops.  ABDOMEN: Soft, nontender, nondistended. Bowel sounds present. No organomegaly or mass.  EXTREMITIES: No pedal edema, cyanosis, or clubbing.  NEUROLOGIC: Patient is unstable unable to do full neuro exam because she is in respiratory distress.  But no gross neurological deficit is observed.Marland Kitchen.  PSYCHIATRIC: The patient is alert and oriented x 3.  SKIN: No obvious rash, lesion, or ulcer. LABORATORY PANEL:   CBC Recent Labs  Lab 02/21/18 0619  WBC 7.3  HGB 7.0*  HCT 22.1*  PLT 209   ------------------------------------------------------------------------------------------------------------------  Chemistries  Recent Labs  Lab 02/19/18 0503 02/21/18 0619  NA 137 141  K 3.8 3.9  CL 105 103  CO2 25 29  GLUCOSE 155* 262*  BUN 31* 35*  CREATININE 0.82 0.67  CALCIUM 8.1* 8.5*  MG 2.2  --   AST 21  --   ALT 20  --   ALKPHOS 47  --   BILITOT 1.3*  --    ------------------------------------------------------------------------------------------------------------------  Cardiac Enzymes Recent Labs  Lab 02/18/18 0934  TROPONINI <0.03   ------------------------------------------------------------------------------------------------------------------  RADIOLOGY:  Nm Bone W/spect  Result Date: 02/20/2018 CLINICAL DATA:  New onset low back pain. Fall 1 week ago. Concern for acute fracture EXAM: NM BONE SCAN AND SPECT IMAGING TECHNIQUE: After intravenous injection of radiopharmaceutical, delayed planar images were obtained in multiple projections. Additionally, delayed triplanar SPECT images were obtained through the area of interest. RADIOPHARMACEUTICALS:  22.4 mCi Tc-3093m MDP COMPARISON:  CT lumbar spine 02/17/2018 FINDINGS: There is intense uptake along the LEFT margin of the lumbar spine from the L1-L3.  This activity associated with focal the scoliosis and osteophytosis. No evidence  of uptake to suggest acute fracture. Additional uptake noted on the LEFT at L5-S1 associated with the facet hypertrophy Incidental finding of bilateral pleural effusions and basilar atelectasis. IMPRESSION: 1. No evidence of acute lumbar spine fracture. 2. Degenerative osteophytosis from L1 to L3  on the LEFT. 3. Degenerate facet arthropathy arthropathy at L5-S1 on the LEFT. Electronically Signed   By: Genevive Bi M.D.   On: 02/20/2018 14:57    EKG:   Orders placed or performed during the hospital encounter of 02/17/18  . ED EKG  . ED EKG  . EKG 12-Lead  . EKG 12-Lead  . EKG 12-Lead  . EKG 12-Lead    ASSESSMENT AND PLAN:  84 year old female with history of COPD comes in because of severe low back pain and found to have L4 compression fracture  1.  Severe acute on chronic low back pain with L4 compression fracture and with 40% height loss, orthopedic reviewed bone scan and decided against Kyphoplasty as there is nothing acute (I've d/w Dr Rosita Kea). Continue pain medicines, DVT prophylaxis.  2.  Acute respiratory failure with hypoxia likely due to COPD exacerbation, CHF exacerbation: Clinically improved off the BiPAP, on 2 L of oxygen, continue IV steroids, bronchodilators   3 atrial fibrillation with RVR, continue Cardizem p.o., Cardio c/s, echo  4. Anemia likely of chronic dz: Hb 7.0 no obvious source of bleeding. Transfuse 1 PRBC today, patient agreeable.  5. Paranoid delusions, major depression with recurrent psychosis: Seen by psychiatry Dr. Edwin Dada, recommended to taper off Paxil, continue Lexapro, Risperdal,  Prognosis is poor,, condition guarded, discussed with husband.  Palliative care following    All the records are reviewed and case discussed with Care Management/Social Workerr. Management plans discussed with the patient, Cardio, nursing and they are in agreement.  CODE STATUS: Full code  TOTAL TIME TAKING CARE OF THIS PATIENT: 35 minutes.   More than 50% time spent  in counseling, coordination of care   Possible D/C in 1-2 days   Delfino Lovett M.D on 02/21/2018 at 6:59 PM  Between 7am to 6pm - Pager - [phone]  After 6pm go to [email or web address] - password [email or web address] [facility]  [facility]  Office  [phone]  CC: Primary care physician; Erasmo Downer, MD   Note: This dictation was prepared with Dragon dictation along with smaller phrase technology. Any transcriptional errors that result from this process are unintentional.

## 2018-02-21 NOTE — Plan of Care (Signed)
  Problem: Nutrition: Goal: Adequate nutrition will be maintained Outcome: Progressing   Problem: Coping: Goal: Level of anxiety will decrease Outcome: Progressing   Problem: Elimination: Goal: Will not experience complications related to urinary retention Outcome: Progressing   Problem: Pain Managment: Goal: General experience of comfort will improve Outcome: Progressing Note: No complaints of pain this shift   Problem: Safety: Goal: Ability to remain free from injury will improve Outcome: Progressing   Problem: Skin Integrity: Goal: Risk for impaired skin integrity will decrease Outcome: Progressing   

## 2018-02-22 ENCOUNTER — Inpatient Hospital Stay (HOSPITAL_COMMUNITY)
Admit: 2018-02-22 | Discharge: 2018-02-22 | Disposition: A | Discharge status: Home / Self Care | Payer: Medicare Other | Attending: Internal Medicine | Admitting: Internal Medicine

## 2018-02-22 ENCOUNTER — Inpatient Hospital Stay: Payer: Medicare Other

## 2018-02-22 ENCOUNTER — Ambulatory Visit: Payer: Self-pay

## 2018-02-22 ENCOUNTER — Telehealth: Payer: Self-pay | Admitting: Family Medicine

## 2018-02-22 DIAGNOSIS — I4891 Unspecified atrial fibrillation: Secondary | ICD-10-CM

## 2018-02-22 DIAGNOSIS — I10 Essential (primary) hypertension: Secondary | ICD-10-CM

## 2018-02-22 LAB — BASIC METABOLIC PANEL
ANION GAP: 7 (ref 5–15)
BUN: 31 mg/dL — ABNORMAL HIGH (ref 8–23)
CHLORIDE: 101 mmol/L (ref 98–111)
CO2: 31 mmol/L (ref 22–32)
Calcium: 8.5 mg/dL — ABNORMAL LOW (ref 8.9–10.3)
Creatinine, Ser: 0.52 mg/dL (ref 0.44–1.00)
GFR calc Af Amer: >60 mL/min (ref >60)
Glucose, Bld: 289 mg/dL — ABNORMAL HIGH (ref 70–99)
POTASSIUM: 3.9 mmol/L (ref 3.5–5.1)
Sodium: 139 mmol/L (ref 135–145)

## 2018-02-22 LAB — GLUCOSE, CAPILLARY
GLUCOSE-CAPILLARY: 307 mg/dL — AB (ref 70–99)
GLUCOSE-CAPILLARY: 339 mg/dL — AB (ref 70–99)
Glucose-Capillary: 225 mg/dL — ABNORMAL HIGH (ref 70–99)
Glucose-Capillary: 279 mg/dL — ABNORMAL HIGH (ref 70–99)

## 2018-02-22 LAB — CBC
HEMATOCRIT: 25.7 % — AB (ref 36.0–46.0)
HEMOGLOBIN: 8.2 g/dL — AB (ref 12.0–15.0)
MCH: 31.8 pg (ref 26.0–34.0)
MCHC: 31.9 g/dL (ref 30.0–36.0)
MCV: 99.6 fL (ref 80.0–100.0)
NRBC: 0.8 % — AB (ref 0.0–0.2)
PLATELETS: 219 10*3/uL (ref 150–400)
RBC: 2.58 MIL/uL — AB (ref 3.87–5.11)
RDW: 26.3 % — ABNORMAL HIGH (ref 11.5–15.5)
WBC: 8.4 10*3/uL (ref 4.0–10.5)

## 2018-02-22 LAB — ECHOCARDIOGRAM COMPLETE
Height: 62 in
Weight: 3301.61 oz

## 2018-02-22 LAB — TSH: TSH: 1.493 u[IU]/mL (ref 0.350–4.500)

## 2018-02-22 MED ORDER — HYDROCORTISONE 1 % EX CREA
TOPICAL_CREAM | CUTANEOUS | Status: DC | PRN
  Filled 2018-02-22: qty 28

## 2018-02-22 MED ORDER — CARVEDILOL 6.25 MG PO TABS: 6.2500 mg | ORAL_TABLET | Freq: Two times a day (BID) | ORAL | 0 refills | Status: DC

## 2018-02-22 MED ORDER — FUROSEMIDE 10 MG/ML IJ SOLN
40.0000 mg | Freq: Once | INTRAMUSCULAR | Status: AC
  Administered 2018-02-22: 40 mg via INTRAVENOUS
  Filled 2018-02-22: qty 4

## 2018-02-22 MED ORDER — IBUPROFEN 400 MG PO TABS: 400.0000 mg | ORAL_TABLET | Freq: Four times a day (QID) | ORAL | 0 refills | Status: DC | PRN

## 2018-02-22 MED ORDER — RISPERIDONE 0.25 MG PO TABS: 0.2500 mg | ORAL_TABLET | Freq: Every day | ORAL | 0 refills | Status: AC

## 2018-02-22 MED ORDER — FUROSEMIDE 20 MG PO TABS: 20.0000 mg | ORAL_TABLET | Freq: Every day | ORAL | 11 refills | Status: DC | PRN

## 2018-02-22 NOTE — Progress Notes (Signed)
Inpatient Diabetes Program Recommendations  AACE/ADA: New Consensus Statement on Inpatient Glycemic Control (2019)  Target Ranges:  Prepandial:   less than 140 mg/dL      Peak postprandial:   less than 180 mg/dL (1-2 hours)      Critically ill patients:  140 - 180 mg/dL  Results for [name], Jaime S "PAT" (MRN [id number]) as of 02/22/2018 08:05  Ref. Range 02/21/2018 08:10 02/21/2018 16:17 02/21/2018 20:59  Glucose-Capillary Latest Ref Range: 70 - 99 mg/dL 562239 (H) 130348 (H) 865338 (H)  Results for [name], Jaime S "PAT" (MRN [id number]) as of 02/22/2018 08:05  Ref. Range 02/21/2018 14:46  Hemoglobin A1C Latest Ref Range: 4.8 - 5.6 % 5.4    Review of Glycemic Control  Diabetes history: NO Outpatient Diabetes medications: NA Current orders for Inpatient glycemic control: Novolog 0-9 units TID with meals, Novolog 0-5 units QHS; Solumedrol 60 mg Q6H  Inpatient Diabetes Program Recommendations:  Insulin - Basal: If steroids are continued, please consider ordering Lantus 10 units Q24H starting now. Please note that as steroids are tapered, Lantus will need to be adjusted. Insulin - Meal Coverage: If patient is eating well and steroids will be continued, please consider ordering Novolog 3 units TID with meals for meal coverage if patient eats at least 50% of meals. HgbA1C: A1C 5.4% on 02/21/18 indicating an average glucose of 108 mg/dl over the past 2-3 months.  Thanks, Orlando PennerMarie Tymber Stallings, RN, MSN, CDE Diabetes Coordinator Inpatient Diabetes Program [phone] (Team Pager from 8am to 5pm)

## 2018-02-22 NOTE — Discharge Instructions (Signed)

## 2018-02-22 NOTE — Progress Notes (Signed)
[facility] [place] [facility]   PATIENT NAME: Jaime [name]    MR#:  [id number]  DATE OF BIRTH:  1927/12/26  SUBJECTIVE: Since seen in ICU, off the BiPAP, appears slightly more comfortable with breathing but has back pain.  CHIEF COMPLAINT:   Chief Complaint  Patient presents with  . Back Pain  was planing on DC but HR in 140s with minimal exertion - reported by nursing, cancelled the D/C and will get CXR as she is also dyspneic and give 40 mg lasix REVIEW OF SYSTEMS:   ROS CONSTITUTIONAL: No fever, fatigue or weakness.  EYES: Legally blind.  EARS, NOSE, AND THROAT: No tinnitus or ear pain.  RESPIRATORY: Shortness of breath, cough.  CARDIOVASCULAR: No chest pain, orthopnea, edema.  GASTROINTESTINAL: No nausea, vomiting, diarrhea or abdominal pain.  GENITOURINARY: No dysuria, hematuria.  ENDOCRINE: No polyuria, nocturia,  HEMATOLOGY: No anemia, easy bruising or bleeding SKIN: No rash or lesion. MUSCULOSKELETAL: Severe low back pain. NEUROLOGIC: No tingling, numbness, weakness.  PSYCHIATRY: Anxious.,  Paranoid delusions DRUG ALLERGIES:   Allergies  Allergen Reactions  . Codeine Nausea And Vomiting    Pt states she can't take oxycodone.  She states she can take Percocet.  . Levofloxacin Other (See Comments)  . Oxycodone Nausea And Vomiting  . Tramadol Hcl Anxiety    VITALS:  Blood pressure (!) 170/90, pulse 86, temperature 97.6 F (36.4 C), temperature source Oral, resp. rate 20, height 5\' 2"  (1.575 m), weight 93.6 kg, SpO2 94 %.  PHYSICAL EXAMINATION:  GENERAL:  84 y.o.-year-old patient lying in the bed with obvious respiratory distress, using accessory muscles of respiration. EYES: Pupils equal, round, reactive to light . No scleral icterus. Extraocular muscles intact.  HEENT: Head atraumatic, normocephalic. Oropharynx and nasopharynx clear.  NECK:  Supple, no jugular venous distention. No thyroid enlargement, no tenderness.   LUNGS: Bilateral coarse breath sounds present, basilar crepitus present. CARDIOVASCULAR: S1, S2 tachycardic. No murmurs, rubs, or gallops.  ABDOMEN: Soft, nontender, nondistended. Bowel sounds present. No organomegaly or mass.  EXTREMITIES: No pedal edema, cyanosis, or clubbing.  NEUROLOGIC: Patient is unstable unable to do full neuro exam because she is in respiratory distress.  But no gross neurological deficit is observed.Marland Kitchen.  PSYCHIATRIC: The patient is alert and oriented x 3.  SKIN: No obvious rash, lesion, or ulcer. LABORATORY PANEL:   CBC Recent Labs  Lab 02/22/18 0521  WBC 8.4  HGB 8.2*  HCT 25.7*  PLT 219   ------------------------------------------------------------------------------------------------------------------  Chemistries  Recent Labs  Lab 02/19/18 0503  02/22/18 0521  NA 137   < > 139  K 3.8   < > 3.9  CL 105   < > 101  CO2 25   < > 31  GLUCOSE 155*   < > 289*  BUN 31*   < > 31*  CREATININE 0.82   < > 0.52  CALCIUM 8.1*   < > 8.5*  MG 2.2  --   --   AST 21  --   --   ALT 20  --   --   ALKPHOS 47  --   --   BILITOT 1.3*  --   --    < > = values in this interval not displayed.   ------------------------------------------------------------------------------------------------------------------  Cardiac Enzymes Recent Labs  Lab 02/18/18 0934  TROPONINI <0.03   ------------------------------------------------------------------------------------------------------------------  RADIOLOGY:  Dg Chest Port 1 View  Result Date: 02/22/2018 CLINICAL DATA:  Increased shortness of breath EXAM: PORTABLE CHEST  1 VIEW COMPARISON:  02/18/2018 FINDINGS: Cardiomegaly with vascular congestion. Left lower lobe atelectasis or infiltrate. Small bilateral effusions suspected. No real change since prior study. IMPRESSION: Cardiomegaly with vascular congestion. Small bilateral effusions with left base atelectasis or infiltrate. Electronically Signed   By: Charlett Nose  M.D.   On: 02/22/2018 16:28    EKG:   Orders placed or performed during the hospital encounter of 02/17/18  . ED EKG  . ED EKG  . EKG 12-Lead  . EKG 12-Lead  . EKG 12-Lead  . EKG 12-Lead  . EKG 12-Lead  . EKG 12-Lead    ASSESSMENT AND PLAN:  84 year old female with history of COPD comes in because of severe low back pain and found to have L4 compression fracture  1.  Severe acute on chronic low back pain with L4 compression fracture and with 40% height loss, orthopedic reviewed bone scan and decided against Kyphoplasty as there is nothing acute (I've d/w Dr Rosita Kea). Continue pain medicines, DVT prophylaxis.  2.  Acute respiratory failure with hypoxia likely due to COPD exacerbation, CHF exacerbation: Clinically improved off the BiPAP, on 2 L of oxygen, continue IV steroids, bronchodilators   3 atrial fibrillation with RVR, continue Cardizem 30 mg p.o. Q 6, Cardio following, echo  4. Anemia likely of chronic dz: Hb 7.0 s/p 1 PRBC transfusion and Hb 8.2. no obvious source of bleeding.   5. Paranoid delusions, major depression with recurrent psychosis: Seen by psychiatry Dr. Edwin Dada, recommended to taper off Paxil, continue Lexapro, Risperdal,  Prognosis is poor, condition guarded, discussed with husband.  Palliative care following    All the records are reviewed and case discussed with Care Management/Social Workerr. Management plans discussed with the patient, Cardio, nursing and they are in agreement.  CODE STATUS: Full code  TOTAL TIME TAKING CARE OF THIS PATIENT: 35 minutes.   More than 50% time spent in counseling, coordination of care   Possible D/C in 1-2 days   Delfino Lovett M.D on 02/22/2018 at 5:21 PM  Between 7am to 6pm - Pager - [phone]  After 6pm go to [email or web address] - password [email or web address] [facility]  [facility]  Office  [phone]  CC: Primary care physician; Erasmo Downer, MD   Note: This dictation was prepared with Dragon dictation  along with smaller phrase technology. Any transcriptional errors that result from this process are unintentional.

## 2018-02-22 NOTE — Progress Notes (Signed)
*  PRELIMINARY RESULTS* Echocardiogram 2D Echocardiogram has been performed.  Cristela BlueHege, Janijah Symons 02/22/2018, 10:36 AM

## 2018-02-22 NOTE — Care Management (Signed)
Discharge cancelled. When getting patient into the wheelchair to leave, heart rate elevated to 140's.  Notified [facility]. Will need another home 02 assessment prior to discharge as current qualification has expired

## 2018-02-22 NOTE — Progress Notes (Signed)
Patient HR elevated upon exertion earlier, Afib RVR in 150's, patient very dyspneic with exertion. MD and cardio notified. Per attending, discharge was cancelled. Ordered a repeat CXR, vascular congestion noted, verbal to give 40 IV lasix. Will give and continue to assess and monitor.

## 2018-02-22 NOTE — Progress Notes (Signed)
PICC line removed from patient. Tolerated well. Line intact, 37cm total. No bleeding. Gauze and transparent dressing applied, will continue to assess and monitor for bleeding.

## 2018-02-22 NOTE — Care Management Note (Signed)
Case Management Note  Patient Details  Name: Shaune Spittleatricia S Yodice MRN: [id number] Date of Birth: 07/16/1927  Subjective/Objective:                 For discharge home today.  Husband will transport.  patient has qualified for home oxygen which will be provided by [facility].  [facility] to provide home health- RN PT OT SW   Action/Plan:   Expected Discharge Date:  02/22/18               Expected Discharge Plan:     In-House Referral:     Discharge planning Services  CM Consult  Post Acute Care Choice:  Durable Medical Equipment, Home Health, Resumption of Svcs/PTA Provider Choice offered to:  Spouse  DME Arranged:  Oxygen DME Agency:  [facility]  HH Arranged:  RN, PT, OT, Social Work(HF and COPD Protocols also) HH Agency:  [facility]  Status of Service:  Completed, signed off  If discussed at Long Length of Stay Meetings, dates discussed:    Additional Comments:  Eber HongGreene, Kayona Foor R, RN 02/22/2018, 1:46 PM

## 2018-02-22 NOTE — Telephone Encounter (Signed)
Pt is scheduled for hospital f/u on 03/02/18 and is being discharged today. Thanks TNP

## 2018-02-22 NOTE — Progress Notes (Signed)
Progress Note  Patient Name: Jaime [name] Date of Encounter: 02/22/2018  Primary Cardiologist: Yvonne Kendall, MD   Subjective   Still feels palpitations and intermittent back pain.  No chest pain or shortness of breath.  Inpatient Medications    Scheduled Meds: . cholecalciferol  1,000 Units Oral Daily  . diclofenac sodium  2 g Topical QID  . diltiazem  30 mg Oral Q6H  . escitalopram  5 mg Oral Daily  . furosemide  40 mg Intravenous Daily  . heparin  5,000 Units Subcutaneous Q8H  . ibuprofen  600 mg Oral QID  . Influenza vac split quadrivalent PF  0.5 mL Intramuscular Tomorrow-1000  . insulin aspart  0-5 Units Subcutaneous QHS  . insulin aspart  0-9 Units Subcutaneous TID WC  . ipratropium-albuterol  3 mL Nebulization Q6H  . LORazepam  1 mg Intravenous Once  . LORazepam  1 mg Intravenous Once  . magnesium oxide  400 mg Oral Daily  . methylPREDNISolone (SOLU-MEDROL) injection  60 mg Intravenous Q6H  . mirabegron ER  25 mg Oral Daily  . polyethylene glycol  17 g Oral Daily  . risperiDONE  0.25 mg Oral QHS  . senna-docusate  2 tablet Oral BID   Continuous Infusions: . sodium chloride Stopped (02/21/18 2315)  . ceFEPime (MAXIPIME) IV 1 g (02/22/18 0841)   PRN Meds: sodium chloride, acetaminophen **OR** acetaminophen, albuterol, bisacodyl, LORazepam, magnesium hydroxide, ondansetron **OR** ondansetron (ZOFRAN) IV, phenol, polyvinyl alcohol, sodium chloride flush   Vital Signs    Vitals:   02/21/18 1959 02/22/18 0004 02/22/18 0419 02/22/18 0826  BP: (!) 171/81 (!) 160/80 (!) 175/85 (!) 174/81  Pulse: 93 82 92 96  Resp: 18  20 18   Temp: 98 F (36.7 C)  98.3 F (36.8 C) 97.6 F (36.4 C)  TempSrc: Oral  Oral Oral  SpO2: 92% 97% 95% 94%  Weight:   93.6 kg   Height:        Intake/Output Summary (Last 24 hours) at 02/22/2018 1111 Last data filed at 02/22/2018 4540 Gross per 24 hour  Intake 124 ml  Output 1400 ml  Net -1276 ml   Filed Weights   02/20/18 0500 02/21/18 0433 02/22/18 0419  Weight: 94.2 kg 94.2 kg 93.6 kg    Telemetry    A-fib (HR 60-110 bpm). - Personally Reviewed  ECG    No new tracing.  Physical Exam   GEN: No acute distress.   Neck: No JVD Cardiac: Irregularly irregular without murmurs. Respiratory: Clear to auscultation bilaterally. GI: Soft with mild diffuse tenderness.  No rebound or guarding. MS: No edema; No deformity. Neuro:  Nonfocal  Psych: Somewhat confused.  Labs    Chemistry Recent Labs  Lab 02/17/18 0511  02/19/18 0503 02/21/18 0619 02/22/18 0521  NA 141   < > 137 141 139  K 4.3   < > 3.8 3.9 3.9  CL 106   < > 105 103 101  CO2 27   < > 25 29 31   GLUCOSE 105*   < > 155* 262* 289*  BUN 18   < > 31* 35* 31*  CREATININE 0.71   < > 0.82 0.67 0.52  CALCIUM 9.0   < > 8.1* 8.5* 8.5*  PROT 8.1  --  6.2*  --   --   ALBUMIN 4.4  --  3.2*  --   --   AST 18  --  21  --   --   ALT 12  --  20  --   --   ALKPHOS 54  --  47  --   --   BILITOT 1.2  --  1.3*  --   --   GFRNONAA >60   < > >60 >60 >60  GFRAA >60   < > >60 >60 >60  ANIONGAP 8   < > 7 9 7    < > = values in this interval not displayed.     Hematology Recent Labs  Lab 02/19/18 0503 02/21/18 0619 02/22/18 0521  WBC 17.5* 7.3 8.4  RBC 2.29* 2.15* 2.58*  HGB 7.3* 7.0* 8.2*  HCT 23.8* 22.1* 25.7*  MCV 103.9* 102.8* 99.6  MCH 31.9 32.6 31.8  MCHC 30.7 31.7 31.9  RDW 27.4* 26.1* 26.3*  PLT 218 209 219    Cardiac Enzymes Recent Labs  Lab 02/17/18 0511 02/18/18 0934  TROPONINI <0.03 <0.03   No results for input(s): TROPIPOC in the last 168 hours.   BNPNo results for input(s): BNP, PROBNP in the last 168 hours.   DDimer No results for input(s): DDIMER in the last 168 hours.   Radiology    Nm Bone W/spect  Result Date: 02/20/2018 CLINICAL DATA:  New onset low back pain. Fall 1 week ago. Concern for acute fracture EXAM: NM BONE SCAN AND SPECT IMAGING TECHNIQUE: After intravenous injection of radiopharmaceutical,  delayed planar images were obtained in multiple projections. Additionally, delayed triplanar SPECT images were obtained through the area of interest. RADIOPHARMACEUTICALS:  22.4 mCi Tc-8156m MDP COMPARISON:  CT lumbar spine 02/17/2018 FINDINGS: There is intense uptake along the LEFT margin of the lumbar spine from the L1-L3. This activity associated with focal the scoliosis and osteophytosis. No evidence of uptake to suggest acute fracture. Additional uptake noted on the LEFT at L5-S1 associated with the facet hypertrophy Incidental finding of bilateral pleural effusions and basilar atelectasis. IMPRESSION: 1. No evidence of acute lumbar spine fracture. 2. Degenerative osteophytosis from L1 to L3  on the LEFT. 3. Degenerate facet arthropathy arthropathy at L5-S1 on the LEFT. Electronically Signed   By: Genevive BiStewart  Edmunds M.D.   On: 02/20/2018 14:57   Cardiac Studies   Echo pending: Prelim review; normal to hyperdynamic LVEF.  Patient Profile     84 y.o. female with history of paroxysmal atrial fibrillation, chronic anemia, COPD, GERD, blindness, and delusional disorder, admitted with back pain complicated by a-fib with RVR, hypoxia, and anemia.  Assessment & Plan    Paroxysmal atrial fibrillation Patient still in a-fib but with adequate rate control.  She was on diltiazem 30 mg PO Q6 hours but was switched to carvedilol 6.25 mg BID today by Dr. Sherryll BurgerShah.  She notes palpitations but otherwise is asymptomatic.  Continue rate control strategy.  Not entirely sure why she was switched from diltiazem to carvedilol.  Will need to monitor HR control with this switch.  Goal resting HR < 110 bpm.  Patient not an anticoagulation candidate due to severe anemia requiring PRBC transfusion during this hospitalization and history of falls.  Recommend checking TSH.  F/u formal echo read.  Hypertension BP suboptimally controlled.  Defer medication changes to Dr. Sherryll BurgerShah.  [facility] will sign off.   Medication  Recommendations:  Titrate carvedilol or restart diltiazem ER 120 mg daily per primary service for goal HR < 110 bpm. Other recommendations (labs, testing, etc):  F/u Echo Follow up as an outpatient:  [facility] in ~2 weeks.  For questions or updates, please contact [facility] Please consult [email or web address] for contact  info under [facility]     Signed, Yvonne Kendall, MD  02/22/2018, 11:11 AM

## 2018-02-22 NOTE — Plan of Care (Signed)
  Problem: Clinical Measurements: Goal: Diagnostic test results will improve Outcome: Progressing Note:  Hgb went up from 7.3 to 8.2 after 1 unit of blood   Problem: Nutrition: Goal: Adequate nutrition will be maintained Outcome: Progressing   Problem: Coping: Goal: Level of anxiety will decrease Outcome: Progressing   Problem: Elimination: Goal: Will not experience complications related to urinary retention Outcome: Progressing   Problem: Pain Managment: Goal: General experience of comfort will improve Outcome: Progressing Note:  No complaints of pain this shift   Problem: Safety: Goal: Ability to remain free from injury will improve Outcome: Progressing   Problem: Skin Integrity: Goal: Risk for impaired skin integrity will decrease Outcome: Progressing

## 2018-02-22 NOTE — Care Management Important Message (Signed)
Important Message  Patient Details  Name: Jaime [name] MRN: [id number] Date of Birth: [birth year]   Medicare Important Message Given:  Yes  given at 10:340 A  Eber HongGreene, Braileigh Landenberger R, RN 02/22/2018, 1:41 PM

## 2018-02-23 LAB — CBC
HCT: 26.2 % — ABNORMAL LOW (ref 36.0–46.0)
Hemoglobin: 8.6 g/dL — ABNORMAL LOW (ref 12.0–15.0)
MCH: 31.9 pg (ref 26.0–34.0)
MCHC: 32.8 g/dL (ref 30.0–36.0)
MCV: 97 fL (ref 80.0–100.0)
Platelets: 228 10*3/uL (ref 150–400)
RBC: 2.7 MIL/uL — ABNORMAL LOW (ref 3.87–5.11)
RDW: 25.2 % — ABNORMAL HIGH (ref 11.5–15.5)
WBC: 9.9 10*3/uL (ref 4.0–10.5)
nRBC: 1.7 % — ABNORMAL HIGH (ref 0.0–0.2)

## 2018-02-23 LAB — BASIC METABOLIC PANEL
Anion gap: 9 (ref 5–15)
BUN: 38 mg/dL — AB (ref 8–23)
CHLORIDE: 96 mmol/L — AB (ref 98–111)
CO2: 34 mmol/L — ABNORMAL HIGH (ref 22–32)
CREATININE: 0.61 mg/dL (ref 0.44–1.00)
Calcium: 8.4 mg/dL — ABNORMAL LOW (ref 8.9–10.3)
GFR calc Af Amer: >60 mL/min (ref >60)
GFR calc non Af Amer: >60 mL/min (ref >60)
Glucose, Bld: 333 mg/dL — ABNORMAL HIGH (ref 70–99)
Potassium: 3.4 mmol/L — ABNORMAL LOW (ref 3.5–5.1)
SODIUM: 139 mmol/L (ref 135–145)

## 2018-02-23 LAB — GLUCOSE, CAPILLARY
GLUCOSE-CAPILLARY: 266 mg/dL — AB (ref 70–99)
GLUCOSE-CAPILLARY: 283 mg/dL — AB (ref 70–99)
GLUCOSE-CAPILLARY: 280 mg/dL — AB (ref 70–99)
GLUCOSE-CAPILLARY: 351 mg/dL — AB (ref 70–99)
Glucose-Capillary: 283 mg/dL — ABNORMAL HIGH (ref 70–99)

## 2018-02-23 LAB — MAGNESIUM: MAGNESIUM: 2 mg/dL (ref 1.7–2.4)

## 2018-02-23 MED ORDER — DILTIAZEM HCL 30 MG PO TABS
60.0000 mg | ORAL_TABLET | Freq: Four times a day (QID) | ORAL | Status: DC
  Administered 2018-02-23 – 2018-02-24 (×4): 60 mg via ORAL
  Filled 2018-02-23 (×4): qty 2

## 2018-02-23 MED ORDER — POTASSIUM CHLORIDE CRYS ER 20 MEQ PO TBCR
40.0000 meq | EXTENDED_RELEASE_TABLET | Freq: Once | ORAL | Status: AC
  Administered 2018-02-23: 40 meq via ORAL
  Filled 2018-02-23: qty 2

## 2018-02-23 NOTE — Progress Notes (Signed)
Inpatient Diabetes Program Recommendations  AACE/ADA: New Consensus Statement on Inpatient Glycemic Control (2019)  Target Ranges:  Prepandial:   less than 140 mg/dL      Peak postprandial:   less than 180 mg/dL (1-2 hours)      Critically ill patients:  140 - 180 mg/dL  Results for Jaime [name], Jaime S "PAT" (MRN [id number]) as of 02/23/2018 07:48  Ref. Range 02/22/2018 08:24 02/22/2018 11:57 02/22/2018 17:03 02/22/2018 21:08 02/23/2018 07:41  Glucose-Capillary Latest Ref Range: 70 - 99 mg/dL 119225 (H) 147307 (H) 829279 (H) 339 (H) 266 (H)   Results for Jaime [name], Jaime S "PAT" (MRN [id number]) as of 02/22/2018 08:05  Ref. Range 02/21/2018 08:10 02/21/2018 16:17 02/21/2018 20:59  Glucose-Capillary Latest Ref Range: 70 - 99 mg/dL 784239 (H) 696348 (H) 295338 (H)  Results for Jaime [name], Jaime S "PAT" (MRN [id number]) as of 02/22/2018 08:05  Ref. Range 02/21/2018 14:46  Hemoglobin A1C Latest Ref Range: 4.8 - 5.6 % 5.4    Review of Glycemic Control  Diabetes history: NO Outpatient Diabetes medications: NA Current orders for Inpatient glycemic control: Novolog 0-9 units TID with meals, Novolog 0-5 units QHS; Solumedrol 60 mg Q6H  Inpatient Diabetes Program Recommendations:  Insulin - Basal: If steroids are continued, please consider ordering Lantus 10 units Q24H starting now. Please note that as steroids are tapered, Lantus will need to be adjusted. Insulin - Meal Coverage: If patient is eating well and steroids will be continued, please consider ordering Novolog 3 units TID with meals for meal coverage if patient eats at least 50% of meals. HgbA1C: A1C 5.4% on 02/21/18 indicating an average glucose of 108 mg/dl over the past 2-3 months.  Thanks, Orlando PennerMarie Zeth Buday, RN, MSN, CDE Diabetes Coordinator Inpatient Diabetes Program [phone] (Team Pager from 8am to 5pm)

## 2018-02-23 NOTE — Progress Notes (Signed)
Patient ambulated to the bathroom.  HR up to 120-130 sustaining with ambulation.  Will continue to monitor.

## 2018-02-23 NOTE — Plan of Care (Signed)
Patient has been sitting on the side of bed without any respiratory distress. Breathing is not labored. No elevated heart rates reported by central tele this shift.

## 2018-02-23 NOTE — Progress Notes (Signed)
Progress Note  Patient Name: Jaime [name] Date of Encounter: 02/23/2018  Primary Cardiologist: Yvonne Kendallhristopher End, MD   Subjective   The plan was to discharge the patient yesterday.  However, her heart rate increased with minimal exertion to 140 bpm.  She continues to be in atrial fibrillation.  She reports mild palpitations.  She had shortness of breath at that time and was given 1 dose of IV furosemide.  Chest x-ray showed pulmonary vascular congestion.  Inpatient Medications    Scheduled Meds: . cholecalciferol  1,000 Units Oral Daily  . diclofenac sodium  2 g Topical QID  . diltiazem  60 mg Oral Q6H  . escitalopram  5 mg Oral Daily  . furosemide  40 mg Intravenous Daily  . heparin  5,000 Units Subcutaneous Q8H  . ibuprofen  600 mg Oral QID  . insulin aspart  0-5 Units Subcutaneous QHS  . insulin aspart  0-9 Units Subcutaneous TID WC  . ipratropium-albuterol  3 mL Nebulization Q6H  . LORazepam  1 mg Intravenous Once  . LORazepam  1 mg Intravenous Once  . magnesium oxide  400 mg Oral Daily  . methylPREDNISolone (SOLU-MEDROL) injection  60 mg Intravenous Q6H  . mirabegron ER  25 mg Oral Daily  . polyethylene glycol  17 g Oral Daily  . risperiDONE  0.25 mg Oral QHS  . senna-docusate  2 tablet Oral BID   Continuous Infusions: . sodium chloride 500 mL (02/22/18 2242)  . ceFEPime (MAXIPIME) IV 1 g (02/22/18 2243)   PRN Meds: sodium chloride, acetaminophen **OR** acetaminophen, albuterol, bisacodyl, hydrocortisone cream, LORazepam, magnesium hydroxide, ondansetron **OR** ondansetron (ZOFRAN) IV, phenol, polyvinyl alcohol, sodium chloride flush   Vital Signs    Vitals:   02/23/18 0404 02/23/18 0406 02/23/18 0739 02/23/18 0821  BP: (!) 180/82 (!) 166/97 (!) 161/81   Pulse: 94 91 92   Resp:      Temp: 97.6 F (36.4 C)  97.9 F (36.6 C)   TempSrc: Oral  Oral   SpO2: 96%  97% 97%  Weight: 92 kg     Height:        Intake/Output Summary (Last 24 hours) at  02/23/2018 0907 Last data filed at 02/23/2018 0414 Gross per 24 hour  Intake 2153 ml  Output 1950 ml  Net 203 ml   Filed Weights   02/21/18 0433 02/22/18 0419 02/23/18 0404  Weight: 94.2 kg 93.6 kg 92 kg    Telemetry    A-fib (HR 80-110 bpm). - Personally Reviewed  ECG    No new tracing.  Physical Exam   GEN: No acute distress.   Neck: No JVD Cardiac: Irregularly irregular without murmurs. Respiratory: Clear to auscultation bilaterally. GI: Soft with mild diffuse tenderness.  No rebound or guarding. MS: No edema; No deformity. Neuro:  Nonfocal  Psych: Somewhat confused.  Labs    Chemistry Recent Labs  Lab 02/17/18 0511  02/19/18 0503 02/21/18 0619 02/22/18 0521 02/23/18 0336  NA 141   < > 137 141 139 139  K 4.3   < > 3.8 3.9 3.9 3.4*  CL 106   < > 105 103 101 96*  CO2 27   < > 25 29 31  34*  GLUCOSE 105*   < > 155* 262* 289* 333*  BUN 18   < > 31* 35* 31* 38*  CREATININE 0.71   < > 0.82 0.67 0.52 0.61  CALCIUM 9.0   < > 8.1* 8.5* 8.5* 8.4*  PROT 8.1  --  6.2*  --   --   --   ALBUMIN 4.4  --  3.2*  --   --   --   AST 18  --  21  --   --   --   ALT 12  --  20  --   --   --   ALKPHOS 54  --  47  --   --   --   BILITOT 1.2  --  1.3*  --   --   --   GFRNONAA >60   < > >60 >60 >60 >60  GFRAA >60   < > >60 >60 >60 >60  ANIONGAP 8   < > 7 9 7 9    < > = values in this interval not displayed.     Hematology Recent Labs  Lab 02/21/18 0619 02/22/18 0521 02/23/18 0336  WBC 7.3 8.4 9.9  RBC 2.15* 2.58* 2.70*  HGB 7.0* 8.2* 8.6*  HCT 22.1* 25.7* 26.2*  MCV 102.8* 99.6 97.0  MCH 32.6 31.8 31.9  MCHC 31.7 31.9 32.8  RDW 26.1* 26.3* 25.2*  PLT 209 219 228    Cardiac Enzymes Recent Labs  Lab 02/17/18 0511 02/18/18 0934  TROPONINI <0.03 <0.03   No results for input(s): TROPIPOC in the last 168 hours.   BNPNo results for input(s): BNP, PROBNP in the last 168 hours.   DDimer No results for input(s): DDIMER in the last 168 hours.   Radiology    Dg  Chest Port 1 View  Result Date: 02/22/2018 CLINICAL DATA:  Increased shortness of breath EXAM: PORTABLE CHEST 1 VIEW COMPARISON:  02/18/2018 FINDINGS: Cardiomegaly with vascular congestion. Left lower lobe atelectasis or infiltrate. Small bilateral effusions suspected. No real change since prior study. IMPRESSION: Cardiomegaly with vascular congestion. Small bilateral effusions with left base atelectasis or infiltrate. Electronically Signed   By: Charlett Nose M.D.   On: 02/22/2018 16:28   Cardiac Studies   Echocardiogram done yesterday Normal ejection fraction with no significant valvular abnormalities.  Patient Profile     84 y.o. female with history of paroxysmal atrial fibrillation, chronic anemia, COPD, GERD, blindness, and delusional disorder, admitted with back pain complicated by a-fib with RVR, hypoxia, and anemia.  Assessment & Plan    Paroxysmal atrial fibrillation  Patient still in a-fib but with suboptimal rate control.    Continue rate control strategy.  I increase diltiazem to 60 mg every 6 hours which can be switched to 240 mg extended release once daily upon discharge.  Patient not an anticoagulation candidate due to severe anemia requiring PRBC transfusion during this hospitalization and history of falls.    Hypertension BP is still elevated.  I increase diltiazem as outlined above.   For questions or updates, please contact [facility] Please consult [email or web address] for contact info under [facility]     Signed, Lorine Bears, MD  02/23/2018, 9:07 AM

## 2018-02-23 NOTE — Progress Notes (Signed)
[facility] [place] [facility]   PATIENT NAME: Jaime [name]    MR#:  [id number]  DATE OF BIRTH:  04-10-1928  SUBJECTIVE: Since seen in ICU, off the BiPAP, appears slightly more comfortable with breathing but has back pain.  CHIEF COMPLAINT:   Chief Complaint  Patient presents with  . Back Pain  HR jumps to 120-130s with minimal ambulation (she went to bathroom), BP up REVIEW OF SYSTEMS:   ROS CONSTITUTIONAL: No fever, fatigue or weakness.  EYES: Legally blind.  EARS, NOSE, AND THROAT: No tinnitus or ear pain.  RESPIRATORY: Shortness of breath, cough. CARDIOVASCULAR: No chest pain, orthopnea, edema.  GASTROINTESTINAL: No nausea, vomiting, diarrhea or abdominal pain.  GENITOURINARY: No dysuria, hematuria.  ENDOCRINE: No polyuria, nocturia,  HEMATOLOGY: No anemia, easy bruising or bleeding SKIN: No rash or lesion. MUSCULOSKELETAL: Severe low back pain. NEUROLOGIC: No tingling, numbness, weakness.  PSYCHIATRY: Anxious.,  Paranoid delusions DRUG ALLERGIES:   Allergies  Allergen Reactions  . Codeine Nausea And Vomiting    Pt states she can't take oxycodone.  She states she can take Percocet.  . Levofloxacin Other (See Comments)  . Oxycodone Nausea And Vomiting  . Tramadol Hcl Anxiety    VITALS:  Blood pressure (!) 161/81, pulse 92, temperature 97.9 F (36.6 C), temperature source Oral, resp. rate 20, height 5\' 2"  (1.575 m), weight 92 kg, SpO2 97 %.  PHYSICAL EXAMINATION:  GENERAL:  84 y.o.-year-old patient lying in the bed with obvious respiratory distress, using accessory muscles of respiration. EYES: Pupils equal, round, reactive to light . No scleral icterus. Extraocular muscles intact.  HEENT: Head atraumatic, normocephalic. Oropharynx and nasopharynx clear.  NECK:  Supple, no jugular venous distention. No thyroid enlargement, no tenderness.  LUNGS: Bilateral coarse breath sounds present, basilar crepitus present. CARDIOVASCULAR:  S1, S2 tachycardic. No murmurs, rubs, or gallops.  ABDOMEN: Soft, nontender, nondistended. Bowel sounds present. No organomegaly or mass.  EXTREMITIES: No pedal edema, cyanosis, or clubbing.  NEUROLOGIC: Patient is unstable unable to do full neuro exam because she is in respiratory distress.  But no gross neurological deficit is observed.Marland Kitchen.  PSYCHIATRIC: The patient is alert and oriented x 3.  SKIN: No obvious rash, lesion, or ulcer. LABORATORY PANEL:   CBC Recent Labs  Lab 02/23/18 0336  WBC 9.9  HGB 8.6*  HCT 26.2*  PLT 228   ------------------------------------------------------------------------------------------------------------------  Chemistries  Recent Labs  Lab 02/19/18 0503  02/23/18 0336  NA 137   < > 139  K 3.8   < > 3.4*  CL 105   < > 96*  CO2 25   < > 34*  GLUCOSE 155*   < > 333*  BUN 31*   < > 38*  CREATININE 0.82   < > 0.61  CALCIUM 8.1*   < > 8.4*  MG 2.2  --   --   AST 21  --   --   ALT 20  --   --   ALKPHOS 47  --   --   BILITOT 1.3*  --   --    < > = values in this interval not displayed.   ------------------------------------------------------------------------------------------------------------------  Cardiac Enzymes Recent Labs  Lab 02/18/18 0934  TROPONINI <0.03   ------------------------------------------------------------------------------------------------------------------  RADIOLOGY:  Dg Chest Port 1 View  Result Date: 02/22/2018 CLINICAL DATA:  Increased shortness of breath EXAM: PORTABLE CHEST 1 VIEW COMPARISON:  02/18/2018 FINDINGS: Cardiomegaly with vascular congestion. Left lower lobe atelectasis or infiltrate. Small bilateral effusions suspected.  No real change since prior study. IMPRESSION: Cardiomegaly with vascular congestion. Small bilateral effusions with left base atelectasis or infiltrate. Electronically Signed   By: Charlett Nose M.D.   On: 02/22/2018 16:28    EKG:   Orders placed or performed during the hospital  encounter of 02/17/18  . ED EKG  . ED EKG  . EKG 12-Lead  . EKG 12-Lead  . EKG 12-Lead  . EKG 12-Lead  . EKG 12-Lead  . EKG 12-Lead    ASSESSMENT AND PLAN:  84 year old female with history of COPD comes in because of severe low back pain and found to have L4 compression fracture  1.  Severe acute on chronic low back pain with L4 compression fracture and with 40% height loss, orthopedic reviewed bone scan and decided against Kyphoplasty as there is nothing acute (I've d/w Dr Rosita Kea). Continue pain medicines  2.  Acute respiratory failure with hypoxia likely due to COPD exacerbation, CHF exacerbation: Clinically improved off the BiPAP, on 2 L of oxygen, continue IV steroids, bronchodilators   3 atrial fibrillation with RVR: increase Cardizem 60 mg p.o. Q 6, Cardio following, echo pending, HR still up in 120-130s with minimal ambulation  4. Anemia likely of chronic dz: Hb 7.0 s/p 1 PRBC transfusion and Hb 8.6. no obvious source of bleeding.   5. Paranoid delusions, major depression with recurrent psychosis: Seen by psychiatry Dr. Edwin Dada, recommended to taper off Paxil, continue Lexapro, Risperdal,  Prognosis is poor, condition guarded, discussed with husband.  Palliative care following    All the records are reviewed and case discussed with Care Management/Social Workerr. Management plans discussed with the patient, Cardio, nursing and they are in agreement.  CODE STATUS: Full code  TOTAL TIME TAKING CARE OF THIS PATIENT: 35 minutes.   More than 50% time spent in counseling, coordination of care   Possible D/C in 1-2 days   Delfino Lovett M.D on 02/23/2018 at 1:18 PM  Between 7am to 6pm - Pager - [phone]  After 6pm go to [email or web address] - password [email or web address] [facility]  [facility]  Office  [phone]  CC: Primary care physician; Erasmo Downer, MD   Note: This dictation was prepared with Dragon dictation along with smaller phrase technology. Any  transcriptional errors that result from this process are unintentional.
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
## 2018-02-24 LAB — TYPE AND SCREEN
ABO/RH(D): A NEG
ANTIBODY SCREEN: NEGATIVE
Donor AG Type: NEGATIVE
PT AG TYPE: POSITIVE
UNIT DIVISION: 0
Unit division: 0
Unit division: 0

## 2018-02-24 LAB — BASIC METABOLIC PANEL
Anion gap: 10 (ref 5–15)
BUN: 42 mg/dL — AB (ref 8–23)
CO2: 34 mmol/L — ABNORMAL HIGH (ref 22–32)
CREATININE: 0.67 mg/dL (ref 0.44–1.00)
Calcium: 8.4 mg/dL — ABNORMAL LOW (ref 8.9–10.3)
Chloride: 93 mmol/L — ABNORMAL LOW (ref 98–111)
Glucose, Bld: 280 mg/dL — ABNORMAL HIGH (ref 70–99)
POTASSIUM: 3.8 mmol/L (ref 3.5–5.1)
SODIUM: 137 mmol/L (ref 135–145)

## 2018-02-24 LAB — GLUCOSE, CAPILLARY
GLUCOSE-CAPILLARY: 273 mg/dL — AB (ref 70–99)
GLUCOSE-CAPILLARY: 339 mg/dL — AB (ref 70–99)
GLUCOSE-CAPILLARY: 205 mg/dL — AB (ref 70–99)
Glucose-Capillary: 251 mg/dL — ABNORMAL HIGH (ref 70–99)

## 2018-02-24 LAB — BPAM RBC
BLOOD PRODUCT EXPIRATION DATE: 201911282359
BLOOD PRODUCT EXPIRATION DATE: 201912032359
Blood Product Expiration Date: 201911282359
ISSUE DATE / TIME: 201911121507
UNIT TYPE AND RH: 600
UNIT TYPE AND RH: 600
Unit Type and Rh: 600

## 2018-02-24 LAB — CBC
HCT: 26.1 % — ABNORMAL LOW (ref 36.0–46.0)
Hemoglobin: 8.3 g/dL — ABNORMAL LOW (ref 12.0–15.0)
MCH: 31.7 pg (ref 26.0–34.0)
MCHC: 31.8 g/dL (ref 30.0–36.0)
MCV: 99.6 fL (ref 80.0–100.0)
PLATELETS: 234 10*3/uL (ref 150–400)
RBC: 2.62 MIL/uL — AB (ref 3.87–5.11)
RDW: 24.8 % — ABNORMAL HIGH (ref 11.5–15.5)
WBC: 14.6 10*3/uL — AB (ref 4.0–10.5)
nRBC: 1.4 % — ABNORMAL HIGH (ref 0.0–0.2)

## 2018-02-24 MED ORDER — METHYLPREDNISOLONE SODIUM SUCC 125 MG IJ SOLR
60.0000 mg | Freq: Two times a day (BID) | INTRAMUSCULAR | Status: DC
  Administered 2018-02-24 – 2018-02-26 (×4): 60 mg via INTRAVENOUS
  Filled 2018-02-24 (×4): qty 2

## 2018-02-24 MED ORDER — DILTIAZEM HCL ER COATED BEADS 240 MG PO CP24
240.0000 mg | ORAL_CAPSULE | Freq: Every day | ORAL | Status: DC
  Administered 2018-02-24 – 2018-02-26 (×3): 240 mg via ORAL
  Filled 2018-02-24: qty 2
  Filled 2018-02-24 (×2): qty 1
  Filled 2018-02-24: qty 2
  Filled 2018-02-24: qty 1
  Filled 2018-02-24: qty 2

## 2018-02-24 MED ORDER — INSULIN GLARGINE 100 UNIT/ML ~~LOC~~ SOLN
10.0000 [IU] | Freq: Every day | SUBCUTANEOUS | Status: DC
  Administered 2018-02-24 – 2018-02-26 (×3): 10 [IU] via SUBCUTANEOUS
  Filled 2018-02-24 (×4): qty 0.1

## 2018-02-24 NOTE — Progress Notes (Signed)
Progress Note  Patient Name: Jaime [name] Date of Encounter: 02/24/2018  Primary Cardiologist: Yvonne Kendall, MD  Subjective   Breathing stable.  HR stable in the 80's.  No chest pain.  Many questions.  Eager to go home.  Inpatient Medications    Scheduled Meds: . cholecalciferol  1,000 Units Oral Daily  . diclofenac sodium  2 g Topical QID  . diltiazem  240 mg Oral Daily  . furosemide  40 mg Intravenous Daily  . heparin  5,000 Units Subcutaneous Q8H  . ibuprofen  600 mg Oral QID  . insulin aspart  0-5 Units Subcutaneous QHS  . insulin aspart  0-9 Units Subcutaneous TID WC  . insulin glargine  10 Units Subcutaneous Daily  . ipratropium-albuterol  3 mL Nebulization Q6H  . LORazepam  1 mg Intravenous Once  . LORazepam  1 mg Intravenous Once  . magnesium oxide  400 mg Oral Daily  . methylPREDNISolone (SOLU-MEDROL) injection  60 mg Intravenous Q12H  . mirabegron ER  25 mg Oral Daily  . polyethylene glycol  17 g Oral Daily  . senna-docusate  2 tablet Oral BID   Continuous Infusions: . sodium chloride 500 mL (02/23/18 2210)  . ceFEPime (MAXIPIME) IV 1 g (02/24/18 0948)   PRN Meds: sodium chloride, acetaminophen **OR** acetaminophen, albuterol, bisacodyl, hydrocortisone cream, LORazepam, magnesium hydroxide, ondansetron **OR** ondansetron (ZOFRAN) IV, phenol, polyvinyl alcohol, sodium chloride flush   Vital Signs    Vitals:   02/24/18 0503 02/24/18 0516 02/24/18 0748 02/24/18 0802  BP: (!) 159/77   (!) 155/74  Pulse: 71   68  Resp:      Temp: 97.8 F (36.6 C)   97.9 F (36.6 C)  TempSrc: Oral   Oral  SpO2: 95%  (!) 88% 92%  Weight:  94.7 kg    Height:        Intake/Output Summary (Last 24 hours) at 02/24/2018 1121 Last data filed at 02/24/2018 0817 Gross per 24 hour  Intake 136.46 ml  Output 2600 ml  Net -2463.54 ml   Filed Weights   02/22/18 0419 02/23/18 0404 02/24/18 0516  Weight: 93.6 kg 92 kg 94.7 kg    Physical Exam   GEN: Well  nourished, well developed, in no acute distress.  HEENT: Blind.  HOH. Neck: Supple, no JVD, carotid bruits, or masses. Cardiac: IR, IR, no murmurs, rubs, or gallops. No clubbing, cyanosis, trace bilat LE edema.  Radials/DP/PT 2+ and equal bilaterally.  Respiratory:  Respirations regular and unlabored, scattered rhonchi w/ faint exp wheezing. GI: Soft, nontender, nondistended, BS + x 4. MS: no deformity or atrophy. Skin: warm and dry, no rash. Neuro:  Strength and sensation are intact. Psych: AAOx3.  Normal affect.  Labs    Chemistry Recent Labs  Lab 02/19/18 0503  02/22/18 0521 02/23/18 0336 02/24/18 0353  NA 137   < > 139 139 137  K 3.8   < > 3.9 3.4* 3.8  CL 105   < > 101 96* 93*  CO2 25   < > 31 34* 34*  GLUCOSE 155*   < > 289* 333* 280*  BUN 31*   < > 31* 38* 42*  CREATININE 0.82   < > 0.52 0.61 0.67  CALCIUM 8.1*   < > 8.5* 8.4* 8.4*  PROT 6.2*  --   --   --   --   ALBUMIN 3.2*  --   --   --   --   AST 21  --   --   --   --  ALT 20  --   --   --   --   ALKPHOS 47  --   --   --   --   BILITOT 1.3*  --   --   --   --   GFRNONAA >60   < > >60 >60 >60  GFRAA >60   < > >60 >60 >60  ANIONGAP 7   < > 7 9 10    < > = values in this interval not displayed.     Hematology Recent Labs  Lab 02/22/18 0521 02/23/18 0336 02/24/18 0353  WBC 8.4 9.9 14.6*  RBC 2.58* 2.70* 2.62*  HGB 8.2* 8.6* 8.3*  HCT 25.7* 26.2* 26.1*  MCV 99.6 97.0 99.6  MCH 31.8 31.9 31.7  MCHC 31.9 32.8 31.8  RDW 26.3* 25.2* 24.8*  PLT 219 228 234    Cardiac Enzymes Recent Labs  Lab 02/18/18 0934  TROPONINI <0.03      Radiology    Dg Chest Port 1 View  Result Date: 02/22/2018 CLINICAL DATA:  Increased shortness of breath EXAM: PORTABLE CHEST 1 VIEW COMPARISON:  02/18/2018 FINDINGS: Cardiomegaly with vascular congestion. Left lower lobe atelectasis or infiltrate. Small bilateral effusions suspected. No real change since prior study. IMPRESSION: Cardiomegaly with vascular congestion. Small  bilateral effusions with left base atelectasis or infiltrate. Electronically Signed   By: Charlett Nose M.D.   On: 02/22/2018 16:28    Telemetry    Afib, 80's - Personally Reviewed  Cardiac Studies   2D Echocardiogram 11.13.2019  Study Conclusions   - Left ventricle: The cavity size was normal. Wall thickness was   normal. Systolic function was normal. The estimated ejection   fraction was in the range of 60% to 65%. Wall motion was normal;   there were no regional wall motion abnormalities. - Mitral valve: Calcified annulus. Mildly thickened leaflets . - Left atrium: The atrium was moderately to severely dilated. - Right ventricle: The cavity size was normal. Wall thickness was   normal. Systolic function was normal. - Right atrium: The atrium was mildly dilated.   Patient Profile     84 y.o. female with history of paroxysmal atrial fibrillation, chronic anemia, COPD, GERD, blindness, and delusional disorder, admitted with back pain complicated by a-fib with RVR, hypoxia, and anemia.  Assessment & Plan    1.  Persistent Atrial Fibrillation:  H/o PAF, admitted w/ back pain  Afib RVR.  Rates up yesterday and dilt increased to 60 q 6h.  Rates better over past 24 hrs - ~ 80's.  Dilt consolidated to 240 daily this AM.  Cont to monitor HR with ambulation.  No OAC 2/2 severe anemia req PRBC tx this admission.  Also h/o falls.  2.  Essential HTN:  BP remains elevated despite higher dose of dilt.  Both HR and BP room to titrate further if necessary.    3.  Acute diast CHF:  Likely exacerbated by rapid afib.  Receiving IV lasix. Minus 1.6L overnight and 5.2 L for admissoin.  Wt recorded as up 2.7kg despite neg neg. Dry wt appears to be ~ 198 lbs. Trace lower ext edema on exam.  Exam otw challenging due to body habitus.  She says she was on lasix @ home previously (not on med list) but took it sparingly b/c she didn't like having to stay in the bathroom for 45 mins after taking it.  She also  eats out frequently.  I suspect that with afib/loss of atrial kick, she  will require daily lasix.  If she goes home today, would send on lasix 40 mg daily. K stable @ 3.8 w/o supplementation currently.  We discussed the importance of daily weights, sodium restriction, medication compliance, and symptom reporting and she verbalizes understanding.   4. COPD:  Still wheezing some. Per IM.  5.  Back pain:  Per IM.  Signed, Nicolasa Duckinghristopher Micaila Ziemba, NP  02/24/2018, 11:21 AM    For questions or updates, please contact   Please consult [email or web address] for contact info under Cardiology/STEMI.

## 2018-02-24 NOTE — Progress Notes (Signed)
Inpatient Diabetes Program Recommendations  AACE/ADA: New Consensus Statement on Inpatient Glycemic Control (2019)  Target Ranges:  Prepandial:   less than 140 mg/dL      Peak postprandial:   less than 180 mg/dL (1-2 hours)      Critically ill patients:  140 - 180 mg/dL  Results for Jaime [name], [name] "PAT" (MRN [id number]) as of 02/24/2018 07:31  Ref. Range 02/24/2018 03:53  Glucose Latest Ref Range: 70 - 99 mg/dL 409280 (H)   Results for Jaime [name], Jaime [name] "PAT" (MRN [id number]) as of 02/24/2018 07:31  Ref. Range 02/23/2018 07:41 02/23/2018 11:26 02/23/2018 14:12 02/23/2018 17:22 02/23/2018 21:15  Glucose-Capillary Latest Ref Range: 70 - 99 mg/dL 956266 (H) 213283 (H) 086283 (H) 280 (H) 351 (H)    Results for Jaime [name], Jaime [name] "PAT" (MRN [id number]) as of 02/23/2018 07:48  Ref. Range 02/22/2018 08:24 02/22/2018 11:57 02/22/2018 17:03 02/22/2018 21:08  Glucose-Capillary Latest Ref Range: 70 - 99 mg/dL 528225 (H) 413307 (H) 244279 (H) 339 (H)   Results for Jaime [name], Jaime [name] "PAT" (MRN [id number]) as of 02/22/2018 08:05  Ref. Range 02/21/2018 14:46  Hemoglobin A1C Latest Ref Range: 4.8 - 5.6 % 5.4    Review of Glycemic Control  Diabetes history: NO Outpatient Diabetes medications: NA Current orders for Inpatient glycemic control: Novolog 0-9 units TID with meals, Novolog 0-5 units QHS; Solumedrol 60 mg Q6H  Inpatient Diabetes Program Recommendations:  Insulin - Basal: If steroids are continued, please consider ordering Lantus 10 units Q24H starting now. Please note that as steroids are tapered, Lantus will need to be adjusted. Insulin - Meal Coverage: If patient is eating well and steroids will be continued, please consider ordering Novolog 3 units TID with meals for meal coverage if patient eats at least 50% of meals. HgbA1C: A1C 5.4% on 02/21/18 indicating an average glucose of 108 mg/dl over the past 2-3 months.  Thanks, Orlando PennerMarie Dhani Dannemiller, RN, MSN, CDE Diabetes Coordinator Inpatient Diabetes  Program [phone] (Team Pager from 8am to 5pm)

## 2018-02-24 NOTE — Progress Notes (Signed)
Chaplain followed up on Pt. Husband was at the bedside. Pt seemed clear of mind and was communicative. Chaplain re enforce her life 's mission of helping the blind and  Caring for other. Chaplain attempted to give a positive of husband. Pt was thankful for the visit. Chaplain prayed and Pt asked for continued prayer.    02/24/18 1100  Clinical Encounter Type  Visited With Patient and family together  Visit Type Spiritual support  Spiritual Encounters  Spiritual Needs Prayer

## 2018-02-24 NOTE — Progress Notes (Signed)
[facility] [place] [facility]   PATIENT NAME: Jaime [name]    MR#:  [id number]  DATE OF BIRTH:  Aug 19, 1927  SUBJECTIVE: Patient complains of back pain but overall her pain is better than before.  Tachycardia also improved, heart rate is around 80 to 90 beats per minute.  CHIEF COMPLAINT:   Chief Complaint  Patient presents with  . Back Pain  O2 sat dropped to 88%, respiratory therapist put her on 2 L of oxygen.  Is tearful today and asking how long she will live. REVIEW OF SYSTEMS:   ROS CONSTITUTIONAL: No fever, fatigue or weakness.  EYES: Legally blind.  EARS, NOSE, AND THROAT: No tinnitus or ear pain.  RESPIRATORY: Shortness of breath, cough. CARDIOVASCULAR: No chest pain, orthopnea, edema.  GASTROINTESTINAL: No nausea, vomiting, diarrhea or abdominal pain.  GENITOURINARY: No dysuria, hematuria.  ENDOCRINE: No polyuria, nocturia,  HEMATOLOGY: No anemia, easy bruising or bleeding SKIN: No rash or lesion. MUSCULOSKELETAL: Severe low back pain. NEUROLOGIC: No tingling, numbness, weakness.  PSYCHIATRY: Anxious.,  Paranoid delusions DRUG ALLERGIES:   Allergies  Allergen Reactions  . Codeine Nausea And Vomiting    Pt states she can't take oxycodone.  She states she can take Percocet.  . Levofloxacin Other (See Comments)  . Oxycodone Nausea And Vomiting  . Tramadol Hcl Anxiety    VITALS:  Blood pressure (!) 155/74, pulse 68, temperature 97.9 F (36.6 C), temperature source Oral, resp. rate 20, height 5\' 2"  (1.575 m), weight 94.7 kg, SpO2 92 %.  PHYSICAL EXAMINATION:  GENERAL:  84 y.o.-year-old patient lying in the bed   EYES: Pupils equal, round, reactive to light . No scleral icterus. Extraocular muscles intact.  HEENT: Head atraumatic, normocephalic. Oropharynx and nasopharynx clear.  NECK:  Supple, no jugular venous distention. No thyroid enlargement, no tenderness.  LUNGS: Bilateral coarse breath sounds present, basilar  crepitus present. CARDIOVASCULAR: S1, S2 regular,. No murmurs, rubs, or gallops.  ABDOMEN: Soft, nontender, nondistended. Bowel sounds present. No organomegaly or mass.  EXTREMITIES: No pedal edema, cyanosis, or clubbing.  NEUROLOGIC: Patient is unstable ,unable to do full neuro exam because she is in respiratory distress.  But no gross neurological deficit is observed.Marland Kitchen.  PSYCHIATRIC: The patient is alert and oriented x 3.  SKIN: No obvious rash, lesion, or ulcer. LABORATORY PANEL:   CBC Recent Labs  Lab 02/24/18 0353  WBC 14.6*  HGB 8.3*  HCT 26.1*  PLT 234   ------------------------------------------------------------------------------------------------------------------  Chemistries  Recent Labs  Lab 02/19/18 0503  02/23/18 0336 02/24/18 0353  NA 137   < > 139 137  K 3.8   < > 3.4* 3.8  CL 105   < > 96* 93*  CO2 25   < > 34* 34*  GLUCOSE 155*   < > 333* 280*  BUN 31*   < > 38* 42*  CREATININE 0.82   < > 0.61 0.67  CALCIUM 8.1*   < > 8.4* 8.4*  MG 2.2  --  2.0  --   AST 21  --   --   --   ALT 20  --   --   --   ALKPHOS 47  --   --   --   BILITOT 1.3*  --   --   --    < > = values in this interval not displayed.   ------------------------------------------------------------------------------------------------------------------  Cardiac Enzymes Recent Labs  Lab 02/18/18 0934  TROPONINI <0.03   ------------------------------------------------------------------------------------------------------------------  RADIOLOGY:  Dg  Chest Port 1 View  Result Date: 02/22/2018 CLINICAL DATA:  Increased shortness of breath EXAM: PORTABLE CHEST 1 VIEW COMPARISON:  02/18/2018 FINDINGS: Cardiomegaly with vascular congestion. Left lower lobe atelectasis or infiltrate. Small bilateral effusions suspected. No real change since prior study. IMPRESSION: Cardiomegaly with vascular congestion. Small bilateral effusions with left base atelectasis or infiltrate. Electronically Signed    By: Charlett Nose M.D.   On: 02/22/2018 16:28    EKG:   Orders placed or performed during the hospital encounter of 02/17/18  . ED EKG  . ED EKG  . EKG 12-Lead  . EKG 12-Lead  . EKG 12-Lead  . EKG 12-Lead  . EKG 12-Lead  . EKG 12-Lead    ASSESSMENT AND PLAN:  84 year old female with history of COPD comes in because of severe low back pain and found to have L4 compression fracture  1.  Severe acute on chronic low back pain with L4 compression fracture and with 40% height loss, orthopedic reviewed bone scan and decided against Kyphoplasty as there is nothing acute (I've d/w Dr Rosita Kea). Continue pain medicines will order lumbar corset for her.  And patient is okay with that.  2.  Acute respiratory failure with hypoxia likely due to COPD exacerbation, CHF exacerbation: Clinically improved off the BiPAP, on 2 L of oxygen, continue IV steroids, bronchodilators, according to patient's husband patient has baseline shortness of breath, COPD but never required oxygen.  Patient is on 2 L of oxygen because O2 sat dropped to below 88% this morning.  3 atrial fibrillation with RVR: i seen by [facility] cardiology, echocardiogram showed EF more than 50 to 55%, and to Cardizem CD 240 mg p.o. daily today.  4. Anemia likely of chronic dz: Hb 7.0 s/p 1 PRBC transfusion and Hb 8.6. no obvious source of bleeding.   5. Paranoid delusions, major depression with recurrent psychosis: Seen by psychiatry Dr. Edwin Dada, /Dr. Toni Amend.  Patient Lexapro discontinued as patient complained of nightmares.  Psych managing her psych medication.    #6. hyperglycemia due to steroids: Blood glucose around 280s to 350s.  Diabetes coordinator recommended low-dose Lantus ordered that, continue sliding scale insulin with coverage, decrease the dose of steroids hopefully hypoglycemia should correct slowly. Prognosis is poor, condition guarded, discussed with husband.  Palliative care following  Likely discharge Sunday with home  health.  Discussed with the patient to wear lumbar corset when she gets up, will order that today.  Patient is against narcotics, for pain control.  All the records are reviewed and case discussed with Care Management/Social Workerr. Management plans discussed with the patient, Cardio, nursing and they are in agreement.  CODE STATUS: Full code  TOTAL TIME TAKING CARE OF THIS PATIENT: 35 minutes.   More than 50% time spent in counseling, coordination of care   Possible D/C in 1-2 days   Katha Hamming M.D on 02/24/2018 at 10:54 AM  Between 7am to 6pm - Pager - [phone]  After 6pm go to [email or web address] - password [email or web address] [facility]  [place] [facility]  Office  [phone]  CC: Primary care physician; Erasmo Downer, MD   Note: This dictation was prepared with Dragon dictation along with smaller phrase technology. Any transcriptional errors that result from this process are unintentional.

## 2018-02-24 NOTE — Progress Notes (Signed)
[facility] [place] at [facility]   PATIENT NAME: Jaime [name]    MR#:  [id number]  DATE OF BIRTH:  Aug 19, 1927  SUBJECTIVE: Patient complains of back pain but overall her pain is better than before.  Tachycardia also improved, heart rate is around 80 to 90 beats per minute.  CHIEF COMPLAINT:   Chief Complaint  Patient presents with  . Back Pain  O2 sat dropped to 88%, respiratory therapist put her on 2 L of oxygen.  Is tearful today and asking how long she will live. REVIEW OF SYSTEMS:   ROS CONSTITUTIONAL: No fever, fatigue or weakness.  EYES: Legally blind.  EARS, NOSE, AND THROAT: No tinnitus or ear pain.  RESPIRATORY: Shortness of breath, cough. CARDIOVASCULAR: No chest pain, orthopnea, edema.  GASTROINTESTINAL: No nausea, vomiting, diarrhea or abdominal pain.  GENITOURINARY: No dysuria, hematuria.  ENDOCRINE: No polyuria, nocturia,  HEMATOLOGY: No anemia, easy bruising or bleeding SKIN: No rash or lesion. MUSCULOSKELETAL: Severe low back pain. NEUROLOGIC: No tingling, numbness, weakness.  PSYCHIATRY: Anxious.,  Paranoid delusions DRUG ALLERGIES:   Allergies  Allergen Reactions  . Codeine Nausea And Vomiting    Pt states she can't take oxycodone.  She states she can take Percocet.  . Levofloxacin Other (See Comments)  . Oxycodone Nausea And Vomiting  . Tramadol Hcl Anxiety    VITALS:  Blood pressure (!) 155/74, pulse 68, temperature 97.9 F (36.6 C), temperature source Oral, resp. rate 20, height 5\' 2"  (1.575 m), weight 94.7 kg, SpO2 92 %.  PHYSICAL EXAMINATION:  GENERAL:  84 y.o.-year-old patient lying in the bed   EYES: Pupils equal, round, reactive to light . No scleral icterus. Extraocular muscles intact.  HEENT: Head atraumatic, normocephalic. Oropharynx and nasopharynx clear.  NECK:  Supple, no jugular venous distention. No thyroid enlargement, no tenderness.  LUNGS: Bilateral coarse breath sounds present, basilar  crepitus present. CARDIOVASCULAR: S1, S2 regular,. No murmurs, rubs, or gallops.  ABDOMEN: Soft, nontender, nondistended. Bowel sounds present. No organomegaly or mass.  EXTREMITIES: No pedal edema, cyanosis, or clubbing.  NEUROLOGIC: Patient is unstable ,unable to do full neuro exam because she is in respiratory distress.  But no gross neurological deficit is observed.Marland Kitchen.  PSYCHIATRIC: The patient is alert and oriented x 3.  SKIN: No obvious rash, lesion, or ulcer. LABORATORY PANEL:   CBC Recent Labs  Lab 02/24/18 0353  WBC 14.6*  HGB 8.3*  HCT 26.1*  PLT 234   ------------------------------------------------------------------------------------------------------------------  Chemistries  Recent Labs  Lab 02/19/18 0503  02/23/18 0336 02/24/18 0353  NA 137   < > 139 137  K 3.8   < > 3.4* 3.8  CL 105   < > 96* 93*  CO2 25   < > 34* 34*  GLUCOSE 155*   < > 333* 280*  BUN 31*   < > 38* 42*  CREATININE 0.82   < > 0.61 0.67  CALCIUM 8.1*   < > 8.4* 8.4*  MG 2.2  --  2.0  --   AST 21  --   --   --   ALT 20  --   --   --   ALKPHOS 47  --   --   --   BILITOT 1.3*  --   --   --    < > = values in this interval not displayed.   ------------------------------------------------------------------------------------------------------------------  Cardiac Enzymes Recent Labs  Lab 02/18/18 0934  TROPONINI <0.03   ------------------------------------------------------------------------------------------------------------------  RADIOLOGY:  Dg  Chest Port 1 View  Result Date: 02/22/2018 CLINICAL DATA:  Increased shortness of breath EXAM: PORTABLE CHEST 1 VIEW COMPARISON:  02/18/2018 FINDINGS: Cardiomegaly with vascular congestion. Left lower lobe atelectasis or infiltrate. Small bilateral effusions suspected. No real change since prior study. IMPRESSION: Cardiomegaly with vascular congestion. Small bilateral effusions with left base atelectasis or infiltrate. Electronically Signed    By: Charlett NoseKevin  Dover M.D.   On: 02/22/2018 16:28    EKG:   Orders placed or performed during the hospital encounter of 02/17/18  . ED EKG  . ED EKG  . EKG 12-Lead  . EKG 12-Lead  . EKG 12-Lead  . EKG 12-Lead  . EKG 12-Lead  . EKG 12-Lead    ASSESSMENT AND PLAN:  84 year old female with history of COPD comes in because of severe low back pain and found to have L4 compression fracture  1.  Severe acute on chronic low back pain with L4 compression fracture and with 40% height loss, orthopedic reviewed bone scan and decided against Kyphoplasty as there is nothing acute (I've d/w Dr Rosita KeaMenz). Continue pain medicines will order lumbar corset for her.  And patient is okay with that.  2.  Acute respiratory failure with hypoxia likely due to COPD exacerbation, CHF exacerbation: Clinically improved off the BiPAP, on 2 L of oxygen, continue IV steroids, bronchodilators, according to patient's husband patient has baseline shortness of breath, COPD but never required oxygen.  Patient is on 2 L of oxygen because O2 sat dropped to below 88% this morning.  3 atrial fibrillation with RVR: [facility], echocardiogram showed EF more than 50 to 55%, and to Cardizem CD 240 mg p.o. daily today.  4. Anemia likely of chronic dz: Hb 7.0 s/p 1 PRBC transfusion and Hb 8.6. no obvious source of bleeding.   5. Paranoid delusions, major depression with recurrent psychosis: Seen by psychiatry Dr. Edwin DadaKapoor, /Dr. Toni Amendlapacs.  Patient Lexapro discontinued as patient complained of nightmares.  Psych managing her psych medication.     Prognosis is poor, condition guarded, discussed with husband.  Palliative care following  Likely discharge Sunday with home health.  Discussed with the patient to wear lumbar corset when she gets up, will order that today.  Patient is against narcotics, for pain control.  All the records are reviewed and case discussed with Care Management/Social Workerr. Management plans  discussed with the patient, Cardio, nursing and they are in agreement.  CODE STATUS: Full code  TOTAL TIME TAKING CARE OF THIS PATIENT: 35 minutes.   More than 50% time spent in counseling, coordination of care   Possible D/C in 1-2 days   Katha HammingSnehalatha Joclynn Lumb M.D on 02/24/2018 at 10:40 AM  Between 7am to 6pm - Pager - [phone]  After 6pm go to [email or web address] - password [email or web address] [email or web address]  [facility]  Office  [phone]  CC: Primary care physician; [name], Angela M, MD   Note: This dictation was prepared with Dragon dictation along with smaller phrase technology. Any transcriptional errors that result from this process are unintentional.

## 2018-02-25 DIAGNOSIS — R0602 Shortness of breath: Secondary | ICD-10-CM

## 2018-02-25 DIAGNOSIS — J9691 Respiratory failure, unspecified with hypoxia: Secondary | ICD-10-CM

## 2018-02-25 DIAGNOSIS — I5033 Acute on chronic diastolic (congestive) heart failure: Secondary | ICD-10-CM

## 2018-02-25 DIAGNOSIS — I4819 Other persistent atrial fibrillation: Secondary | ICD-10-CM

## 2018-02-25 LAB — GLUCOSE, CAPILLARY
GLUCOSE-CAPILLARY: 181 mg/dL — AB (ref 70–99)
Glucose-Capillary: 225 mg/dL — ABNORMAL HIGH (ref 70–99)
Glucose-Capillary: 346 mg/dL — ABNORMAL HIGH (ref 70–99)
Glucose-Capillary: 280 mg/dL — ABNORMAL HIGH (ref 70–99)

## 2018-02-25 MED ORDER — IBUPROFEN 600 MG PO TABS: 600.0000 mg | ORAL_TABLET | Freq: Four times a day (QID) | ORAL | 0 refills | Status: DC

## 2018-02-25 MED ORDER — PREDNISONE 10 MG PO TABS: 10.0000 mg | ORAL_TABLET | Freq: Every day | ORAL | 0 refills | Status: DC

## 2018-02-25 MED ORDER — DILTIAZEM HCL ER COATED BEADS 240 MG PO CP24: 240.0000 mg | ORAL_CAPSULE | Freq: Every day | ORAL | 0 refills | Status: AC

## 2018-02-25 NOTE — Progress Notes (Addendum)
[facility] - [place] at [facility]   PATIENT NAME: Jaime [name]    MR#:  [id number]  DATE OF BIRTH:  1927/08/11  SUBJECTIVE: Patient complains of back pain but overall her pain is better than before.  Tachycardia also improved, heart rate is around 80 to 90 beats per minute.  CHIEF COMPLAINT:   Chief Complaint  Patient presents with  . Back Pain  Patient seen and evaluated today Has some chest congestion Currently on oxygen via nasal cannula Still has back pain REVIEW OF SYSTEMS:   ROS CONSTITUTIONAL: No fever, fatigue or weakness.  EYES: Legally blind.  EARS, NOSE, AND THROAT: No tinnitus or ear pain.  RESPIRATORY: Shortness of breath, cough. CARDIOVASCULAR: No chest pain, orthopnea, edema.  GASTROINTESTINAL: No nausea, vomiting, diarrhea or abdominal pain.  GENITOURINARY: No dysuria, hematuria.  ENDOCRINE: No polyuria, nocturia,  HEMATOLOGY: No anemia, easy bruising or bleeding SKIN: No rash or lesion. MUSCULOSKELETAL: Severe low back pain. NEUROLOGIC: No tingling, numbness, weakness.  PSYCHIATRY: Anxious.,  Paranoid delusions DRUG ALLERGIES:   Allergies  Allergen Reactions  . Codeine Nausea And Vomiting    Pt states she can't take oxycodone.  She states she can take Percocet.  . Levofloxacin Other (See Comments)  . Oxycodone Nausea And Vomiting  . Tramadol Hcl Anxiety    VITALS:  Blood pressure (!) 157/71, pulse 63, temperature 98.4 F (36.9 C), temperature source Oral, resp. rate 18, height 5\' 2"  (1.575 m), weight 95.9 kg, SpO2 98 %.  PHYSICAL EXAMINATION:  GENERAL:  84 y.o.-year-old patient lying in the bed   EYES: Pupils equal, round, reactive to light . No scleral icterus. Extraocular muscles intact.  HEENT: Head atraumatic, normocephalic. Oropharynx and nasopharynx clear.  NECK:  Supple, no jugular venous distention. No thyroid enlargement, no tenderness.  LUNGS: Bilateral coarse breath sounds present, basilar crepitus  present. CARDIOVASCULAR: S1, S2 regular,. No murmurs, rubs, or gallops.  ABDOMEN: Soft, nontender, nondistended. Bowel sounds present. No organomegaly or mass.  EXTREMITIES: No pedal edema, cyanosis, or clubbing.  NEUROLOGIC: Patient is unstable ,unable to do full neuro exam because she is in respiratory distress.  But no gross neurological deficit is observed.Marland Kitchen  PSYCHIATRIC: The patient is alert and oriented x 3.  SKIN: No obvious rash, lesion, or ulcer. LABORATORY PANEL:   CBC Recent Labs  Lab 02/24/18 0353  WBC 14.6*  HGB 8.3*  HCT 26.1*  PLT 234   ------------------------------------------------------------------------------------------------------------------  Chemistries  Recent Labs  Lab 02/19/18 0503  02/23/18 0336 02/24/18 0353  NA 137   < > 139 137  K 3.8   < > 3.4* 3.8  CL 105   < > 96* 93*  CO2 25   < > 34* 34*  GLUCOSE 155*   < > 333* 280*  BUN 31*   < > 38* 42*  CREATININE 0.82   < > 0.61 0.67  CALCIUM 8.1*   < > 8.4* 8.4*  MG 2.2  --  2.0  --   AST 21  --   --   --   ALT 20  --   --   --   ALKPHOS 47  --   --   --   BILITOT 1.3*  --   --   --    < > = values in this interval not displayed.   ------------------------------------------------------------------------------------------------------------------  Cardiac Enzymes No results for input(s): TROPONINI in the last 168 hours. ------------------------------------------------------------------------------------------------------------------  RADIOLOGY:  No results found.  EKG:   Orders  placed or performed during the hospital encounter of 02/17/18  . ED EKG  . ED EKG  . EKG 12-Lead  . EKG 12-Lead  . EKG 12-Lead  . EKG 12-Lead  . EKG 12-Lead  . EKG 12-Lead    ASSESSMENT AND PLAN:  84 year old female with history of COPD comes in because of severe low back pain and found to have L4 compression fracture  1.  Severe acute on chronic low back pain with L4 compression fracture and with 40%  height loss, orthopedic reviewed bone scan and decided against Kyphoplasty as there is nothing acute (I've d/w Dr Rosita KeaMenz). Continue pain medicines and lumbar corset and steroids.    2.  Acute respiratory failure with hypoxia likely due to COPD exacerbation, CHF exacerbation: Clinically improved off the BiPAP, on 2 L of oxygen, this discontinue IV steroids and start oral steroids, bronchodilators, according to patient's husband patient has baseline shortness of breath, COPD but never required oxygen.  Patient is on 2 L of oxygen because O2 sat dropped to below 88% this morning. We will try to wean oxygen Continue diuresis with IV Lasix, discussed with cardiology  3 atrial fibrillation with RVR: [facility], echocardiogram showed EF more than 50 to 55%, and to Cardizem CD 240 mg p.o. daily today.  4. Anemia likely of chronic dz: Hb 7.0 s/p 1 PRBC transfusion and Hb 8.6. no obvious source of bleeding.   5. Paranoid delusions, major depression with recurrent psychosis: Seen by psychiatry Dr. Edwin DadaKapoor, /Dr. Toni Amendlapacs.  Patient Lexapro discontinued as patient complained of nightmares.  Psych managing her psych medication.     6. hyperglycemia due to steroids:  Diabetes coordinator recommendations being followed   7.Palliative care following patient  8.Likely discharge Sunday with home health services once oxygen is been.  Discussed with the patient to wear lumbar corset when she gets up, will order that today.  Patient is against narcotics, for pain control.  9.  Leukocytosis secondary to steroids  All the records are reviewed and case discussed with Care Management/Social Workerr. Management plans discussed with the patient, Cardio, nursing and they are in agreement.  CODE STATUS: Full code  TOTAL TIME TAKING CARE OF THIS PATIENT: 35 minutes.   More than 50% time spent in counseling, coordination of care   Possible D/C in 1-2 days   Ihor AustinPavan  M.D on 02/25/2018 at 12:28  PM  Between 7am to 6pm - Pager - [phone]  After 6pm go to [email or web address] - password [email or web address] [facility]  [facility] [place] [facility]  Office  [phone]  CC: Primary care physician; [name], Angela M, MD   Note: This dictation was prepared with Dragon dictation along with smaller phrase technology. Any transcriptional errors that result from this process are unintentional.

## 2018-02-25 NOTE — Progress Notes (Signed)
Progress Note  Patient Name: Jaime [name] Date of Encounter: 02/25/2018  Primary Cardiologist: End  Subjective   Breathing slightly improved.  Remains in A. fib with ventricular rates in the 70s bpm.  No chest pain.  Remains on supplemental oxygen and has not ambulated.  Still feels like she is somewhat volume overloaded.  Notes some lower extremity swelling.  Inpatient Medications    Scheduled Meds: . cholecalciferol  1,000 Units Oral Daily  . diclofenac sodium  2 g Topical QID  . diltiazem  240 mg Oral Daily  . furosemide  40 mg Intravenous Daily  . heparin  5,000 Units Subcutaneous Q8H  . ibuprofen  600 mg Oral QID  . insulin aspart  0-5 Units Subcutaneous QHS  . insulin aspart  0-9 Units Subcutaneous TID WC  . insulin glargine  10 Units Subcutaneous Daily  . ipratropium-albuterol  3 mL Nebulization Q6H  . LORazepam  1 mg Intravenous Once  . LORazepam  1 mg Intravenous Once  . magnesium oxide  400 mg Oral Daily  . methylPREDNISolone (SOLU-MEDROL) injection  60 mg Intravenous Q12H  . mirabegron ER  25 mg Oral Daily  . polyethylene glycol  17 g Oral Daily  . senna-docusate  2 tablet Oral BID   Continuous Infusions: . sodium chloride 500 mL (02/23/18 2210)   PRN Meds: sodium chloride, acetaminophen **OR** acetaminophen, albuterol, bisacodyl, hydrocortisone cream, LORazepam, magnesium hydroxide, ondansetron **OR** ondansetron (ZOFRAN) IV, phenol, polyvinyl alcohol, sodium chloride flush   Vital Signs    Vitals:   02/24/18 2037 02/24/18 2209 02/25/18 0428 02/25/18 0823  BP:  (!) 145/48 (!) 160/62 (!) 157/71  Pulse:  67 74 63  Resp:  18 19 18   Temp:  97.8 F (36.6 C) 98.4 F (36.9 C)   TempSrc:  Oral Oral   SpO2: 97% 94% 96% 98%  Weight:   95.9 kg   Height:        Intake/Output Summary (Last 24 hours) at 02/25/2018 1217 Last data filed at 02/25/2018 0904 Gross per 24 hour  Intake -  Output 600 ml  Net -600 ml   Filed Weights   02/23/18 0404  02/24/18 0516 02/25/18 0428  Weight: 92 kg 94.7 kg 95.9 kg    Telemetry    A. fib, 70s bpm- Personally Reviewed  ECG    n/a - Personally Reviewed  Physical Exam   GEN: No acute distress. Blind. Hard of hearing. Neck: No JVD. Cardiac:  Irregularly irregular, no murmurs, rubs, or gallops.  Respiratory:  Diminished breath sounds bilaterally.  GI: Soft, nontender, non-distended.   MS:  Trace bilateral pretibial edema; No deformity. Neuro:  Alert and oriented x 3; Nonfocal.  Psych: Normal affect.  Labs    Chemistry Recent Labs  Lab 02/19/18 0503  02/22/18 0521 02/23/18 0336 02/24/18 0353  NA 137   < > 139 139 137  K 3.8   < > 3.9 3.4* 3.8  CL 105   < > 101 96* 93*  CO2 25   < > 31 34* 34*  GLUCOSE 155*   < > 289* 333* 280*  BUN 31*   < > 31* 38* 42*  CREATININE 0.82   < > 0.52 0.61 0.67  CALCIUM 8.1*   < > 8.5* 8.4* 8.4*  PROT 6.2*  --   --   --   --   ALBUMIN 3.2*  --   --   --   --   AST 21  --   --   --   --  ALT 20  --   --   --   --   ALKPHOS 47  --   --   --   --   BILITOT 1.3*  --   --   --   --   GFRNONAA >60   < > >60 >60 >60  GFRAA >60   < > >60 >60 >60  ANIONGAP 7   < > 7 9 10    < > = values in this interval not displayed.     Hematology Recent Labs  Lab 02/22/18 0521 02/23/18 0336 02/24/18 0353  WBC 8.4 9.9 14.6*  RBC 2.58* 2.70* 2.62*  HGB 8.2* 8.6* 8.3*  HCT 25.7* 26.2* 26.1*  MCV 99.6 97.0 99.6  MCH 31.8 31.9 31.7  MCHC 31.9 32.8 31.8  RDW 26.3* 25.2* 24.8*  PLT 219 228 234    Cardiac EnzymesNo results for input(s): TROPONINI in the last 168 hours. No results for input(s): TROPIPOC in the last 168 hours.   BNPNo results for input(s): BNP, PROBNP in the last 168 hours.   DDimer No results for input(s): DDIMER in the last 168 hours.   Radiology    No results found.  Cardiac Studies   Echo 02/22/2018: Study Conclusions  - Left ventricle: The cavity size was normal. Wall thickness was   normal. Systolic function was normal.  The estimated ejection   fraction was in the range of 60% to 65%. Wall motion was normal;   there were no regional wall motion abnormalities. - Mitral valve: Calcified annulus. Mildly thickened leaflets . - Left atrium: The atrium was moderately to severely dilated. - Right ventricle: The cavity size was normal. Wall thickness was   normal. Systolic function was normal. - Right atrium: The atrium was mildly dilated.  Patient Profile     84 y.o. female with history of PAF not on long-term, full dose oral anticoagulation secondary to severe anemia requiring blood transfusions this admission, chronic anemia, COPD, GERD, blindness, hard of hearing, and delusional disorder admitted with back pain complicated by A. fib with RVR, hypoxia, and worsening anemia.  Assessment & Plan    1.  Persistent A. Fib: -Remains in A. fib with well-controlled ventricular rates in the 70s bpm -Continue long-acting diltiazem 240 mg daily -Not on oral anticoagulation secondary to severe anemia requiring packed red blood cell transfusion this admission as well as history of falls -Has not yet ambulated this admission.  Recommend she be ambulated with assistance with either PT or nurse to assess for controlled ventricular response  2.  Hypertension: -Remains suboptimally controlled -Given IV Lasix 40 mg this morning -Continue diltiazem as above -Continue to monitor -Escalate therapy as needed  3.  Acute diastolic CHF: -Likely exacerbated by A. fib with RVR -She continues to appear volume overloaded -Continue IV Lasix with KCl repletion as needed -We will need standing Lasix at discharge given her A. fib as well as dietary indiscretion  4.  Acute respiratory failure with hypoxia/AECOPD: -Remains on supplemental oxygen -Recommend weaning of oxygen per IM -Management of COPD per IM   For questions or updates, please contact [facility] Please consult [email or web address] for contact info under  Cardiology/STEMI.    Signed, Eula Listenyan Dunn, PA-C [facility] Pager: [phone] 02/25/2018, 12:17 PM

## 2018-02-25 NOTE — Progress Notes (Signed)
Pt ambulated with walker on room air to doorway and to b.r. sats 93% on room air. State she does not walk much more than 30 feet at a time at home.

## 2018-02-25 NOTE — Plan of Care (Signed)
Patient display no signs of respiratory distress. Patient has awaken once yelling about bad dreams. Patient has had multiple bowel movements but continue to request an enema. Educate patient about overuse of laxatives.

## 2018-02-26 DIAGNOSIS — M545 Low back pain: Secondary | ICD-10-CM

## 2018-02-26 DIAGNOSIS — J441 Chronic obstructive pulmonary disease with (acute) exacerbation: Secondary | ICD-10-CM

## 2018-02-26 LAB — GLUCOSE, CAPILLARY
GLUCOSE-CAPILLARY: 286 mg/dL — AB (ref 70–99)
GLUCOSE-CAPILLARY: 275 mg/dL — AB (ref 70–99)

## 2018-02-26 MED ORDER — PREDNISONE 10 MG PO TABS: 10.0000 mg | ORAL_TABLET | Freq: Every day | ORAL | 0 refills | Status: DC

## 2018-02-26 NOTE — Care Management Note (Addendum)
Case Management Note  Patient Details  Name: Shaune Spittleatricia S Ade MRN: [id number] Date of Birth: 1927-04-13  Subjective/Objective:  Patient to be discharged per MD order. Orders in place for home health services. Pervious RNCM had began workup for home health via [facility]. Referral confirmed with Jermaine who is in agreement to accept patient for PT,OT and nursing services. Previous orders for DME oxygen, patient no longer qualifies and sats have maintained in the  90's during ambulation. Husband to transport. Inquired [facility] about obtaining a lumbar corsett. If it is available they will deliver.                    Action/Plan:   Expected Discharge Date:  02/26/18               Expected Discharge Plan:     In-House Referral:     Discharge planning Services  CM Consult  Post Acute Care Choice:  Home Health, Resumption of Svcs/PTA Provider Choice offered to:  Patient, Spouse  DME Arranged:    DME Agency:  [facility]  HH Arranged:  RN, PT, OT, Social Work(HF and COPD Protocols also) HH Agency:  [facility]  Status of Service:  Completed, signed off  If discussed at Long Length of Stay Meetings, dates discussed:    Additional Comments:  Virgel ManifoldJosh A Aurianna Earlywine, RN 02/26/2018, 10:27 AM

## 2018-02-26 NOTE — Progress Notes (Signed)
Inpatient Diabetes Program Recommendations  AACE/ADA: New Consensus Statement on Inpatient Glycemic Control (2015)  Target Ranges:  Prepandial:   less than 140 mg/dL      Peak postprandial:   less than 180 mg/dL (1-2 hours)      Critically ill patients:  140 - 180 mg/dL  Results for Jaime SpittleYARBROUGH, Jaime S "PAT" (MRN [id number]) as of 02/26/2018 08:34  Ref. Range 02/25/2018 08:23 02/25/2018 11:42 02/25/2018 16:32 02/25/2018 20:50  Glucose-Capillary Latest Ref Range: 70 - 99 mg/dL 409225 (H) 811346 (H) 914280 (H) 181 (H)    Review of Glycemic Control Diabetes history: NO Outpatient Diabetes medications: NA Current orders for Inpatient glycemic control: Lantus 10 units daily, Novolog 0-9 units TID with meals, Novolog 0-5 units QHS; Solumedrol 60 mg Q12H  Inpatient Diabetes Program Recommendations:   Insulin - Meal Coverage: If patient is eating well and steroids will be continued, please consider ordering Novolog 3 units TID with meals for meal coverage if patient eats at least 50% of meals.  HgbA1C: A1C 5.4% on 02/21/18 indicating an average glucose of 108 mg/dl over the past 2-3 months.  Thanks, Orlando PennerMarie Jackline Castilla, RN, MSN, CDE Diabetes Coordinator Inpatient Diabetes Program [phone] (Team Pager from 8am to 5pm)

## 2018-02-26 NOTE — Plan of Care (Signed)
  Problem: Education: Goal: Ability to demonstrate management of disease process will improve Outcome: Adequate for Discharge   Problem: Clinical Measurements: Goal: Respiratory complications will improve Outcome: Adequate for Discharge   Problem: Clinical Measurements: Goal: Cardiovascular complication will be avoided Outcome: Adequate for Discharge

## 2018-02-26 NOTE — Progress Notes (Signed)
Progress Note  Patient Name: Jaime [name] Date of Encounter: 02/26/2018  Primary Cardiologist: End  Subjective   SOB improved. No chest pain. Weaned to room air. Ambulated on 11/16 with some lower extremity weakness. Husband feels like she is ready for discharge. Remains in Afib with reasonably controlled ventricular rates in the 70s to low 100s bpm. Documented UOP of 2 L for the past 24 hours with a net - 7.1 L for the admission. Weight 95.9-->93.2 kg. Blood pressure is poorly controlled this morning in the 170s systolic.   Inpatient Medications    Scheduled Meds: . cholecalciferol  1,000 Units Oral Daily  . diclofenac sodium  2 g Topical QID  . diltiazem  240 mg Oral Daily  . furosemide  40 mg Intravenous Daily  . heparin  5,000 Units Subcutaneous Q8H  . ibuprofen  600 mg Oral QID  . insulin aspart  0-5 Units Subcutaneous QHS  . insulin aspart  0-9 Units Subcutaneous TID WC  . insulin glargine  10 Units Subcutaneous Daily  . ipratropium-albuterol  3 mL Nebulization Q6H  . LORazepam  1 mg Intravenous Once  . LORazepam  1 mg Intravenous Once  . magnesium oxide  400 mg Oral Daily  . methylPREDNISolone (SOLU-MEDROL) injection  60 mg Intravenous Q12H  . mirabegron ER  25 mg Oral Daily  . polyethylene glycol  17 g Oral Daily  . senna-docusate  2 tablet Oral BID   Continuous Infusions: . sodium chloride 500 mL (02/23/18 2210)   PRN Meds: sodium chloride, acetaminophen **OR** acetaminophen, albuterol, bisacodyl, hydrocortisone cream, LORazepam, magnesium hydroxide, ondansetron **OR** ondansetron (ZOFRAN) IV, phenol, polyvinyl alcohol, sodium chloride flush   Vital Signs    Vitals:   02/25/18 2009 02/26/18 0401 02/26/18 0500 02/26/18 0746  BP: (!) 147/62 (!) 169/68  (!) 170/52  Pulse: 71 83  70  Resp: 16 (!) 22  18  Temp: 98.3 F (36.8 C) 98.3 F (36.8 C)    TempSrc: Oral     SpO2: 94% 90%  92%  Weight:   93.2 kg   Height:        Intake/Output Summary  (Last 24 hours) at 02/26/2018 0917 Last data filed at 02/26/2018 0500 Gross per 24 hour  Intake -  Output 1501 ml  Net -1501 ml   Filed Weights   02/24/18 0516 02/25/18 0428 02/26/18 0500  Weight: 94.7 kg 95.9 kg 93.2 kg    Telemetry    Afib, 70s to low 100s bpm - Personally Reviewed  ECG    n/a - Personally Reviewed  Physical Exam   GEN: No acute distress.   Neck: No JVD. Cardiac: Irregularly irregular, no murmurs, rubs, or gallops.  Respiratory: Audible wheezing bilaterally.  GI: Soft, nontender, non-distended.   MS: No edema; No deformity. Neuro:  Alert and oriented x 3; Nonfocal.  Psych: Normal affect.  Labs    Chemistry Recent Labs  Lab 02/22/18 0521 02/23/18 0336 02/24/18 0353  NA 139 139 137  K 3.9 3.4* 3.8  CL 101 96* 93*  CO2 31 34* 34*  GLUCOSE 289* 333* 280*  BUN 31* 38* 42*  CREATININE 0.52 0.61 0.67  CALCIUM 8.5* 8.4* 8.4*  GFRNONAA >60 >60 >60  GFRAA >60 >60 >60  ANIONGAP 7 9 10      Hematology Recent Labs  Lab 02/22/18 0521 02/23/18 0336 02/24/18 0353  WBC 8.4 9.9 14.6*  RBC 2.58* 2.70* 2.62*  HGB 8.2* 8.6* 8.3*  HCT 25.7* 26.2* 26.1*  MCV 99.6 97.0 99.6  MCH 31.8 31.9 31.7  MCHC 31.9 32.8 31.8  RDW 26.3* 25.2* 24.8*  PLT 219 228 234    Cardiac EnzymesNo results for input(s): TROPONINI in the last 168 hours. No results for input(s): TROPIPOC in the last 168 hours.   BNPNo results for input(s): BNP, PROBNP in the last 168 hours.   DDimer No results for input(s): DDIMER in the last 168 hours.   Radiology    No results found.  Cardiac Studies   Echo 02/22/2018: Study Conclusions  - Left ventricle: The cavity size was normal. Wall thickness was normal. Systolic function was normal. The estimated ejection fraction was in the range of 60% to 65%. Wall motion was normal; there were no regional wall motion abnormalities. - Mitral valve: Calcified annulus. Mildly thickened leaflets . - Left atrium: The atrium was  moderately to severely dilated. - Right ventricle: The cavity size was normal. Wall thickness was normal. Systolic function was normal. - Right atrium: The atrium was mildly dilated.  Patient Profile     84 y.o. female with history of PAF not on long-term, full dose oral anticoagulation secondary to severe anemia requiring blood transfusions this admission, chronic anemia, COPD, GERD, blindness, hard of hearing, and delusional disorder admitted with back pain complicated by A. fib with RVR, hypoxia, and worsening anemia.  Assessment & Plan    1. Persistent A. Fib: -Remains in A. fib with well-controlled ventricular rates mostly in the 70s bpm, with occasional readings in the low 100s bpm -Continue long-acting diltiazem to 240 mg daily -If she stays, would consider increasing Cardizem to 300 mg daily -Not on oral anticoagulation secondary to severe anemia requiring packed red blood cell transfusion this admission as well as history of falls -Has not yet ambulated this admission.  Recommend she be ambulated with assistance with either PT or nurse to assess for controlled ventricular response  2.  Hypertension: -Remains suboptimally controlled -Given IV Lasix 40 mg this morning -Increase diltiazem as above  3.  Acute diastolic CHF: -Likely exacerbated by A. fib with RVR -Good UOP over the past 24 hours with IV Lasix -Much improved -IM has discharged the patient prior to cardiology rounding -She will need standing Lasix 20 mg daily at discharge given her A. fib as well as dietary indiscretion -Check bmet (pending at time of rounds) -Would benefit from [facility], perhaps IM could assist with this  4.  Acute respiratory failure with hypoxia/AECOPD: -Weaned to room air by IM -Management of COPD per IM   For questions or updates, please contact [facility] Please consult [email or web address] for contact info under Cardiology/STEMI.    Signed, Eula Listen, PA-C [facility] Pager:  [phone] 02/26/2018, 9:17 AM
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
## 2018-02-26 NOTE — Plan of Care (Signed)
Afib with controlled rates continue on room air this shift.

## 2018-02-26 NOTE — Progress Notes (Signed)
Pt discharged home with husband, still complains of weakness, but says she is ready to go home. VSS, no other complaints at this time. Pt discharge teaching recorded on personal device d/t patient being blind.

## 2018-02-27 ENCOUNTER — Telehealth: Payer: Self-pay

## 2018-02-27 ENCOUNTER — Telehealth: Payer: Self-pay | Admitting: Family Medicine

## 2018-02-27 NOTE — Telephone Encounter (Signed)
Pt called to cancel her hospital f/u appt with Dr. Leonard SchwartzB on Jaime [name], Nov. 21st. She states she's not physcally able to make the appt.  Thanks, [employer]

## 2018-02-27 NOTE — Telephone Encounter (Signed)
Noted.  Though, I do believe she would benefit from a hospital follow-up  Jaime [name], Jaime SchleinAngela M, MD, MPH [facility] 02/27/2018 12:12 PM

## 2018-02-27 NOTE — Telephone Encounter (Signed)
I have made the 1st attempt to contact the patient or family member in charge, in order to follow up from recently being discharged from the hospital. I left a message on voicemail requesting a CB. -MM 

## 2018-02-28 ENCOUNTER — Emergency Department
Admission: EM | Admit: 2018-02-28 | Discharge: 2018-02-28 | Disposition: A | Discharge status: Home / Self Care | Payer: Medicare Other | Source: Home / Self Care | Attending: Emergency Medicine | Admitting: Emergency Medicine

## 2018-02-28 ENCOUNTER — Other Ambulatory Visit: Payer: Self-pay

## 2018-02-28 ENCOUNTER — Encounter: Payer: Self-pay | Admitting: *Deleted

## 2018-02-28 ENCOUNTER — Telehealth: Payer: Self-pay | Admitting: Family Medicine

## 2018-02-28 DIAGNOSIS — M545 Low back pain, unspecified: Secondary | ICD-10-CM

## 2018-02-28 DIAGNOSIS — Z87820 Personal history of traumatic brain injury: Secondary | ICD-10-CM

## 2018-02-28 DIAGNOSIS — I5032 Chronic diastolic (congestive) heart failure: Secondary | ICD-10-CM | POA: Insufficient documentation

## 2018-02-28 DIAGNOSIS — T380X5A Adverse effect of glucocorticoids and synthetic analogues, initial encounter: Secondary | ICD-10-CM

## 2018-02-28 DIAGNOSIS — I110 Hypertensive heart disease with heart failure: Secondary | ICD-10-CM | POA: Insufficient documentation

## 2018-02-28 DIAGNOSIS — Z79899 Other long term (current) drug therapy: Secondary | ICD-10-CM

## 2018-02-28 DIAGNOSIS — Z96651 Presence of right artificial knee joint: Secondary | ICD-10-CM

## 2018-02-28 DIAGNOSIS — R531 Weakness: Secondary | ICD-10-CM

## 2018-02-28 DIAGNOSIS — Z87891 Personal history of nicotine dependence: Secondary | ICD-10-CM | POA: Insufficient documentation

## 2018-02-28 DIAGNOSIS — R0602 Shortness of breath: Secondary | ICD-10-CM

## 2018-02-28 DIAGNOSIS — T50905A Adverse effect of unspecified drugs, medicaments and biological substances, initial encounter: Secondary | ICD-10-CM

## 2018-02-28 LAB — CBC
HEMATOCRIT: 25.1 % — AB (ref 36.0–46.0)
Hemoglobin: 7.8 g/dL — ABNORMAL LOW (ref 12.0–15.0)
MCH: 31.7 pg (ref 26.0–34.0)
MCHC: 31.1 g/dL (ref 30.0–36.0)
MCV: 102 fL — ABNORMAL HIGH (ref 80.0–100.0)
NRBC: 0.1 % (ref 0.0–0.2)
Platelets: 226 10*3/uL (ref 150–400)
RBC: 2.46 MIL/uL — AB (ref 3.87–5.11)
RDW: 25.6 % — ABNORMAL HIGH (ref 11.5–15.5)
WBC: 25.6 10*3/uL — AB (ref 4.0–10.5)

## 2018-02-28 LAB — URINALYSIS, ROUTINE W REFLEX MICROSCOPIC
Bilirubin Urine: NEGATIVE
Glucose, UA: NEGATIVE mg/dL
Hgb urine dipstick: NEGATIVE
KETONES UR: NEGATIVE mg/dL
LEUKOCYTES UA: NEGATIVE
NITRITE: NEGATIVE
PROTEIN: NEGATIVE mg/dL
Specific Gravity, Urine: 1.018 (ref 1.005–1.030)
pH: 5 (ref 5.0–8.0)

## 2018-02-28 LAB — BASIC METABOLIC PANEL
ANION GAP: 9 (ref 5–15)
BUN: 40 mg/dL — ABNORMAL HIGH (ref 8–23)
CALCIUM: 8.1 mg/dL — AB (ref 8.9–10.3)
CO2: 32 mmol/L (ref 22–32)
Chloride: 96 mmol/L — ABNORMAL LOW (ref 98–111)
Creatinine, Ser: 0.74 mg/dL (ref 0.44–1.00)
Glucose, Bld: 268 mg/dL — ABNORMAL HIGH (ref 70–99)
Potassium: 4 mmol/L (ref 3.5–5.1)
Sodium: 137 mmol/L (ref 135–145)

## 2018-02-28 MED ORDER — KETOROLAC TROMETHAMINE 60 MG/2ML IM SOLN: 30.0000 mg | Freq: Once | INTRAMUSCULAR | Status: DC

## 2018-02-28 MED ORDER — KETOROLAC TROMETHAMINE 30 MG/ML IJ SOLN
15.0000 mg | Freq: Once | INTRAMUSCULAR | Status: AC
  Administered 2018-02-28: 15 mg via INTRAVENOUS
  Filled 2018-02-28: qty 1

## 2018-02-28 NOTE — Telephone Encounter (Signed)
Jaime [name] with [facility] stated she just came for pt's in home visit today and pt is complaining of feeling agitated & disoriented. Delice Bisonara stated the pt feels it might be predniSONE (DELTASONE) 10 MG tablet that the hospital prescribed pt. Delice Bisonara is requesting verbal orders for pt to stop the medication. Please advise. Please advise. Thanks TNP

## 2018-02-28 NOTE — Telephone Encounter (Signed)
Tried calling; [employer] Moser's voicemail box is full.  Will try again later.   Thanks,   -Vernona RiegerLaura
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
## 2018-02-28 NOTE — Telephone Encounter (Signed)
That's fine, OK to stop prednisone.

## 2018-02-28 NOTE — ED Notes (Signed)
Patient's husband is at bedside.

## 2018-02-28 NOTE — ED Triage Notes (Signed)
Per EMS report, patient was recently discharged from the hospital for chronic back pain and shortness of breath. EMT states the call was originally for an allergic reaction to Prednisone. Patient was prescribed prednisone from her discharge and took it for the first time yesterday. Patient states, "I am dying from taking prednisone." Patient is legally blind, wears two liters of O2 at home. Patient has a soft voice, but clear speech. No swelling or hives noted.

## 2018-02-28 NOTE — Telephone Encounter (Signed)
Fabio Asaara Moser -- [phone]  called back.  Please call her back to discuss pt.  Thanks, [employer]

## 2018-03-01 ENCOUNTER — Emergency Department: Payer: Medicare Other

## 2018-03-01 ENCOUNTER — Other Ambulatory Visit: Payer: Self-pay

## 2018-03-01 ENCOUNTER — Inpatient Hospital Stay
Admission: EM | Admit: 2018-03-01 | Discharge: 2018-03-13 | DRG: 539 | Disposition: A | Discharge status: Skilled Nursing Facility | Payer: Medicare Other | Attending: Internal Medicine | Admitting: Internal Medicine

## 2018-03-01 DIAGNOSIS — Z1612 Extended spectrum beta lactamase (ESBL) resistance: Secondary | ICD-10-CM | POA: Diagnosis present

## 2018-03-01 DIAGNOSIS — Z96653 Presence of artificial knee joint, bilateral: Secondary | ICD-10-CM | POA: Diagnosis not present

## 2018-03-01 DIAGNOSIS — J9621 Acute and chronic respiratory failure with hypoxia: Secondary | ICD-10-CM | POA: Diagnosis present

## 2018-03-01 DIAGNOSIS — I5020 Unspecified systolic (congestive) heart failure: Secondary | ICD-10-CM

## 2018-03-01 DIAGNOSIS — Z9981 Dependence on supplemental oxygen: Secondary | ICD-10-CM | POA: Diagnosis not present

## 2018-03-01 DIAGNOSIS — F22 Delusional disorders: Secondary | ICD-10-CM | POA: Diagnosis present

## 2018-03-01 DIAGNOSIS — H9190 Unspecified hearing loss, unspecified ear: Secondary | ICD-10-CM | POA: Diagnosis present

## 2018-03-01 DIAGNOSIS — Z885 Allergy status to narcotic agent status: Secondary | ICD-10-CM

## 2018-03-01 DIAGNOSIS — K5900 Constipation, unspecified: Secondary | ICD-10-CM | POA: Diagnosis present

## 2018-03-01 DIAGNOSIS — R531 Weakness: Secondary | ICD-10-CM | POA: Diagnosis present

## 2018-03-01 DIAGNOSIS — J9600 Acute respiratory failure, unspecified whether with hypoxia or hypercapnia: Secondary | ICD-10-CM

## 2018-03-01 DIAGNOSIS — J189 Pneumonia, unspecified organism: Secondary | ICD-10-CM | POA: Diagnosis present

## 2018-03-01 DIAGNOSIS — G061 Intraspinal abscess and granuloma: Secondary | ICD-10-CM | POA: Diagnosis present

## 2018-03-01 DIAGNOSIS — Z7189 Other specified counseling: Secondary | ICD-10-CM | POA: Diagnosis not present

## 2018-03-01 DIAGNOSIS — Z95828 Presence of other vascular implants and grafts: Secondary | ICD-10-CM

## 2018-03-01 DIAGNOSIS — M4856XA Collapsed vertebra, not elsewhere classified, lumbar region, initial encounter for fracture: Secondary | ICD-10-CM | POA: Diagnosis present

## 2018-03-01 DIAGNOSIS — M4624 Osteomyelitis of vertebra, thoracic region: Secondary | ICD-10-CM | POA: Diagnosis present

## 2018-03-01 DIAGNOSIS — Z8614 Personal history of Methicillin resistant Staphylococcus aureus infection: Secondary | ICD-10-CM

## 2018-03-01 DIAGNOSIS — K6812 Psoas muscle abscess: Secondary | ICD-10-CM | POA: Diagnosis present

## 2018-03-01 DIAGNOSIS — B9561 Methicillin susceptible Staphylococcus aureus infection as the cause of diseases classified elsewhere: Secondary | ICD-10-CM | POA: Diagnosis present

## 2018-03-01 DIAGNOSIS — R7881 Bacteremia: Secondary | ICD-10-CM | POA: Diagnosis present

## 2018-03-01 DIAGNOSIS — Z791 Long term (current) use of non-steroidal anti-inflammatories (NSAID): Secondary | ICD-10-CM

## 2018-03-01 DIAGNOSIS — R159 Full incontinence of feces: Secondary | ICD-10-CM | POA: Diagnosis present

## 2018-03-01 DIAGNOSIS — Z87820 Personal history of traumatic brain injury: Secondary | ICD-10-CM | POA: Diagnosis not present

## 2018-03-01 DIAGNOSIS — I469 Cardiac arrest, cause unspecified: Secondary | ICD-10-CM | POA: Diagnosis not present

## 2018-03-01 DIAGNOSIS — R627 Adult failure to thrive: Secondary | ICD-10-CM | POA: Diagnosis present

## 2018-03-01 DIAGNOSIS — R32 Unspecified urinary incontinence: Secondary | ICD-10-CM | POA: Diagnosis present

## 2018-03-01 DIAGNOSIS — D638 Anemia in other chronic diseases classified elsewhere: Secondary | ICD-10-CM | POA: Diagnosis present

## 2018-03-01 DIAGNOSIS — J449 Chronic obstructive pulmonary disease, unspecified: Secondary | ICD-10-CM | POA: Diagnosis not present

## 2018-03-01 DIAGNOSIS — M47816 Spondylosis without myelopathy or radiculopathy, lumbar region: Secondary | ICD-10-CM | POA: Diagnosis present

## 2018-03-01 DIAGNOSIS — R0600 Dyspnea, unspecified: Secondary | ICD-10-CM

## 2018-03-01 DIAGNOSIS — R001 Bradycardia, unspecified: Secondary | ICD-10-CM | POA: Diagnosis not present

## 2018-03-01 DIAGNOSIS — B9620 Unspecified Escherichia coli [E. coli] as the cause of diseases classified elsewhere: Secondary | ICD-10-CM | POA: Diagnosis present

## 2018-03-01 DIAGNOSIS — R8271 Bacteriuria: Secondary | ICD-10-CM | POA: Diagnosis not present

## 2018-03-01 DIAGNOSIS — E876 Hypokalemia: Secondary | ICD-10-CM | POA: Diagnosis not present

## 2018-03-01 DIAGNOSIS — H548 Legal blindness, as defined in USA: Secondary | ICD-10-CM | POA: Diagnosis present

## 2018-03-01 DIAGNOSIS — R0603 Acute respiratory distress: Secondary | ICD-10-CM | POA: Diagnosis not present

## 2018-03-01 DIAGNOSIS — I509 Heart failure, unspecified: Secondary | ICD-10-CM

## 2018-03-01 DIAGNOSIS — Z9882 Breast implant status: Secondary | ICD-10-CM

## 2018-03-01 DIAGNOSIS — J440 Chronic obstructive pulmonary disease with acute lower respiratory infection: Secondary | ICD-10-CM | POA: Diagnosis present

## 2018-03-01 DIAGNOSIS — N179 Acute kidney failure, unspecified: Secondary | ICD-10-CM | POA: Diagnosis present

## 2018-03-01 DIAGNOSIS — G9341 Metabolic encephalopathy: Secondary | ICD-10-CM | POA: Diagnosis present

## 2018-03-01 DIAGNOSIS — M4645 Discitis, unspecified, thoracolumbar region: Secondary | ICD-10-CM | POA: Diagnosis not present

## 2018-03-01 DIAGNOSIS — M4625 Osteomyelitis of vertebra, thoracolumbar region: Secondary | ICD-10-CM | POA: Diagnosis not present

## 2018-03-01 DIAGNOSIS — Z79899 Other long term (current) drug therapy: Secondary | ICD-10-CM

## 2018-03-01 DIAGNOSIS — K219 Gastro-esophageal reflux disease without esophagitis: Secondary | ICD-10-CM | POA: Diagnosis present

## 2018-03-01 DIAGNOSIS — M4855XA Collapsed vertebra, not elsewhere classified, thoracolumbar region, initial encounter for fracture: Secondary | ICD-10-CM | POA: Diagnosis not present

## 2018-03-01 DIAGNOSIS — I110 Hypertensive heart disease with heart failure: Secondary | ICD-10-CM | POA: Diagnosis present

## 2018-03-01 DIAGNOSIS — N390 Urinary tract infection, site not specified: Secondary | ICD-10-CM | POA: Diagnosis present

## 2018-03-01 DIAGNOSIS — R262 Difficulty in walking, not elsewhere classified: Secondary | ICD-10-CM | POA: Diagnosis present

## 2018-03-01 DIAGNOSIS — Z888 Allergy status to other drugs, medicaments and biological substances status: Secondary | ICD-10-CM

## 2018-03-01 DIAGNOSIS — G8929 Other chronic pain: Secondary | ICD-10-CM | POA: Diagnosis present

## 2018-03-01 DIAGNOSIS — M549 Dorsalgia, unspecified: Secondary | ICD-10-CM | POA: Diagnosis not present

## 2018-03-01 DIAGNOSIS — M4644 Discitis, unspecified, thoracic region: Secondary | ICD-10-CM | POA: Diagnosis present

## 2018-03-01 DIAGNOSIS — I5033 Acute on chronic diastolic (congestive) heart failure: Secondary | ICD-10-CM | POA: Diagnosis present

## 2018-03-01 DIAGNOSIS — Z881 Allergy status to other antibiotic agents status: Secondary | ICD-10-CM | POA: Diagnosis not present

## 2018-03-01 DIAGNOSIS — Z515 Encounter for palliative care: Secondary | ICD-10-CM | POA: Diagnosis not present

## 2018-03-01 DIAGNOSIS — Z87891 Personal history of nicotine dependence: Secondary | ICD-10-CM

## 2018-03-01 DIAGNOSIS — J9601 Acute respiratory failure with hypoxia: Secondary | ICD-10-CM | POA: Diagnosis not present

## 2018-03-01 DIAGNOSIS — Z9181 History of falling: Secondary | ICD-10-CM | POA: Diagnosis not present

## 2018-03-01 DIAGNOSIS — J441 Chronic obstructive pulmonary disease with (acute) exacerbation: Secondary | ICD-10-CM | POA: Diagnosis not present

## 2018-03-01 DIAGNOSIS — M1990 Unspecified osteoarthritis, unspecified site: Secondary | ICD-10-CM | POA: Diagnosis present

## 2018-03-01 HISTORY — DX: Heart failure, unspecified: I50.9

## 2018-03-01 LAB — URINALYSIS, COMPLETE (UACMP) WITH MICROSCOPIC
Bilirubin Urine: NEGATIVE
Glucose, UA: NEGATIVE mg/dL
Ketones, ur: NEGATIVE mg/dL
NITRITE: NEGATIVE
Protein, ur: NEGATIVE mg/dL
SPECIFIC GRAVITY, URINE: 1.017 (ref 1.005–1.030)
pH: 6 (ref 5.0–8.0)

## 2018-03-01 LAB — CBC WITH DIFFERENTIAL/PLATELET
ABS IMMATURE GRANULOCYTES: 0.86 10*3/uL — AB (ref 0.00–0.07)
Basophils Absolute: 0.1 10*3/uL (ref 0.0–0.1)
Basophils Relative: 0 %
EOS ABS: 0 10*3/uL (ref 0.0–0.5)
EOS PCT: 0 %
HCT: 26.7 % — ABNORMAL LOW (ref 36.0–46.0)
Hemoglobin: 8.5 g/dL — ABNORMAL LOW (ref 12.0–15.0)
IMMATURE GRANULOCYTES: 3 %
LYMPHS ABS: 0.7 10*3/uL (ref 0.7–4.0)
LYMPHS PCT: 3 %
MCH: 32.1 pg (ref 26.0–34.0)
MCHC: 31.8 g/dL (ref 30.0–36.0)
MCV: 100.8 fL — ABNORMAL HIGH (ref 80.0–100.0)
MONOS PCT: 4 %
Monocytes Absolute: 1.2 10*3/uL — ABNORMAL HIGH (ref 0.1–1.0)
Neutro Abs: 24.2 10*3/uL — ABNORMAL HIGH (ref 1.7–7.7)
Neutrophils Relative %: 90 %
Platelets: 153 10*3/uL (ref 150–400)
RBC: 2.65 MIL/uL — ABNORMAL LOW (ref 3.87–5.11)
RDW: 25.8 % — ABNORMAL HIGH (ref 11.5–15.5)
WBC: 27.1 10*3/uL — ABNORMAL HIGH (ref 4.0–10.5)
nRBC: 0.2 % (ref 0.0–0.2)

## 2018-03-01 LAB — BLOOD GAS, ARTERIAL
ACID-BASE EXCESS: 11.4 mmol/L — AB (ref 0.0–2.0)
BICARBONATE: 35.1 mmol/L — AB (ref 20.0–28.0)
FIO2: 0.28
O2 Saturation: 89.4 %
PCO2 ART: 42 mmHg (ref 32.0–48.0)
PH ART: 7.53 — AB (ref 7.350–7.450)
PO2 ART: 50 mmHg — AB (ref 83.0–108.0)
Patient temperature: 37

## 2018-03-01 LAB — COMPREHENSIVE METABOLIC PANEL
ALBUMIN: 2.8 g/dL — AB (ref 3.5–5.0)
ALK PHOS: 55 U/L (ref 38–126)
ALT: 16 U/L (ref 0–44)
AST: 26 U/L (ref 15–41)
Anion gap: 13 (ref 5–15)
BUN: 50 mg/dL — AB (ref 8–23)
CALCIUM: 8.1 mg/dL — AB (ref 8.9–10.3)
CO2: 31 mmol/L (ref 22–32)
CREATININE: 1.07 mg/dL — AB (ref 0.44–1.00)
Chloride: 94 mmol/L — ABNORMAL LOW (ref 98–111)
GFR calc Af Amer: 52 mL/min — ABNORMAL LOW (ref >60)
GFR calc non Af Amer: 45 mL/min — ABNORMAL LOW (ref >60)
GLUCOSE: 171 mg/dL — AB (ref 70–99)
Potassium: 4.6 mmol/L (ref 3.5–5.1)
SODIUM: 138 mmol/L (ref 135–145)
Total Bilirubin: 2.6 mg/dL — ABNORMAL HIGH (ref 0.3–1.2)
Total Protein: 5.7 g/dL — ABNORMAL LOW (ref 6.5–8.1)

## 2018-03-01 LAB — LACTIC ACID, PLASMA: LACTIC ACID, VENOUS: 1.5 mmol/L (ref 0.5–1.9)

## 2018-03-01 LAB — TROPONIN I
TROPONIN I: 0.03 ng/mL — AB (ref <0.03)
Troponin I: <0.03 ng/mL (ref <0.03)

## 2018-03-01 LAB — BRAIN NATRIURETIC PEPTIDE: B NATRIURETIC PEPTIDE 5: 233 pg/mL — AB (ref 0.0–100.0)

## 2018-03-01 MED ORDER — ACETAMINOPHEN 650 MG RE SUPP: 650.0000 mg | Freq: Four times a day (QID) | RECTAL | Status: DC | PRN

## 2018-03-01 MED ORDER — IPRATROPIUM-ALBUTEROL 0.5-2.5 (3) MG/3ML IN SOLN
3.0000 mL | Freq: Four times a day (QID) | RESPIRATORY_TRACT | Status: DC | PRN
  Administered 2018-03-07 – 2018-03-08 (×2): 3 mL via RESPIRATORY_TRACT
  Filled 2018-03-01 (×2): qty 3

## 2018-03-01 MED ORDER — RISPERIDONE 0.25 MG PO TABS
0.2500 mg | ORAL_TABLET | Freq: Every day | ORAL | Status: DC
  Administered 2018-03-01 – 2018-03-12 (×10): 0.25 mg via ORAL
  Filled 2018-03-01 (×13): qty 1

## 2018-03-01 MED ORDER — IOHEXOL 300 MG/ML  SOLN
75.0000 mL | Freq: Once | INTRAMUSCULAR | Status: AC | PRN
  Administered 2018-03-01: 75 mL via INTRAVENOUS

## 2018-03-01 MED ORDER — CARVEDILOL 6.25 MG PO TABS
6.2500 mg | ORAL_TABLET | Freq: Two times a day (BID) | ORAL | Status: DC
  Administered 2018-03-02: 6.25 mg via ORAL
  Filled 2018-03-01: qty 1

## 2018-03-01 MED ORDER — IOPAMIDOL (ISOVUE-300) INJECTION 61%
15.0000 mL | Freq: Once | INTRAVENOUS | Status: AC | PRN
  Administered 2018-03-01: 15 mL via ORAL

## 2018-03-01 MED ORDER — ONDANSETRON HCL 4 MG PO TABS: 4.0000 mg | ORAL_TABLET | Freq: Four times a day (QID) | ORAL | Status: DC | PRN

## 2018-03-01 MED ORDER — FUROSEMIDE 10 MG/ML IJ SOLN
20.0000 mg | Freq: Once | INTRAMUSCULAR | Status: AC
  Administered 2018-03-01: 20 mg via INTRAVENOUS
  Filled 2018-03-01: qty 4

## 2018-03-01 MED ORDER — ALBUTEROL SULFATE (2.5 MG/3ML) 0.083% IN NEBU
2.5000 mg | INHALATION_SOLUTION | RESPIRATORY_TRACT | Status: DC | PRN
  Administered 2018-03-07: 2.5 mg via RESPIRATORY_TRACT
  Filled 2018-03-01: qty 3

## 2018-03-01 MED ORDER — DICLOFENAC SODIUM 1 % TD GEL
2.0000 g | Freq: Four times a day (QID) | TRANSDERMAL | Status: DC
  Administered 2018-03-01 – 2018-03-13 (×33): 2 g via TOPICAL
  Filled 2018-03-01 (×2): qty 100

## 2018-03-01 MED ORDER — MOMETASONE FURO-FORMOTEROL FUM 100-5 MCG/ACT IN AERO
2.0000 | INHALATION_SPRAY | Freq: Two times a day (BID) | RESPIRATORY_TRACT | Status: DC
  Administered 2018-03-02 – 2018-03-13 (×21): 2 via RESPIRATORY_TRACT
  Filled 2018-03-01: qty 8.8

## 2018-03-01 MED ORDER — FUROSEMIDE 10 MG/ML IJ SOLN
20.0000 mg | Freq: Two times a day (BID) | INTRAMUSCULAR | Status: DC
  Administered 2018-03-01 – 2018-03-04 (×6): 20 mg via INTRAVENOUS
  Filled 2018-03-01 (×6): qty 2

## 2018-03-01 MED ORDER — ACETAMINOPHEN 325 MG PO TABS
650.0000 mg | ORAL_TABLET | Freq: Four times a day (QID) | ORAL | Status: DC | PRN
  Administered 2018-03-04 – 2018-03-10 (×2): 650 mg via ORAL
  Filled 2018-03-01 (×2): qty 2

## 2018-03-01 MED ORDER — DILTIAZEM HCL ER COATED BEADS 120 MG PO CP24
240.0000 mg | ORAL_CAPSULE | Freq: Every day | ORAL | Status: DC
  Administered 2018-03-01 – 2018-03-13 (×10): 240 mg via ORAL
  Filled 2018-03-01 (×3): qty 2
  Filled 2018-03-01: qty 1
  Filled 2018-03-01: qty 2
  Filled 2018-03-01: qty 1
  Filled 2018-03-01 (×5): qty 2
  Filled 2018-03-01: qty 1
  Filled 2018-03-01: qty 2

## 2018-03-01 MED ORDER — ONDANSETRON HCL 4 MG/2ML IJ SOLN: 4.0000 mg | Freq: Four times a day (QID) | INTRAMUSCULAR | Status: DC | PRN

## 2018-03-01 MED ORDER — MAGNESIUM OXIDE 400 (241.3 MG) MG PO TABS
400.0000 mg | ORAL_TABLET | Freq: Every day | ORAL | Status: DC
  Administered 2018-03-01 – 2018-03-13 (×11): 400 mg via ORAL
  Filled 2018-03-01 (×11): qty 1

## 2018-03-01 MED ORDER — ORAL CARE MOUTH RINSE
15.0000 mL | Freq: Two times a day (BID) | OROMUCOSAL | Status: DC
  Administered 2018-03-02 – 2018-03-12 (×14): 15 mL via OROMUCOSAL

## 2018-03-01 MED ORDER — MIRABEGRON ER 25 MG PO TB24
25.0000 mg | ORAL_TABLET | Freq: Every day | ORAL | Status: DC
  Administered 2018-03-01 – 2018-03-13 (×11): 25 mg via ORAL
  Filled 2018-03-01 (×13): qty 1

## 2018-03-01 MED ORDER — ENOXAPARIN SODIUM 40 MG/0.4ML ~~LOC~~ SOLN
40.0000 mg | SUBCUTANEOUS | Status: DC
  Administered 2018-03-01 – 2018-03-03 (×3): 40 mg via SUBCUTANEOUS
  Filled 2018-03-01 (×3): qty 0.4

## 2018-03-01 MED ORDER — PANTOPRAZOLE SODIUM 40 MG PO TBEC
40.0000 mg | DELAYED_RELEASE_TABLET | Freq: Every day | ORAL | Status: DC
  Administered 2018-03-02 – 2018-03-13 (×10): 40 mg via ORAL
  Filled 2018-03-01 (×10): qty 1

## 2018-03-01 MED ORDER — VITAMIN D3 25 MCG (1000 UNIT) PO TABS
1000.0000 [IU] | ORAL_TABLET | Freq: Every day | ORAL | Status: DC
  Administered 2018-03-01 – 2018-03-13 (×11): 1000 [IU] via ORAL
  Filled 2018-03-01 (×14): qty 1

## 2018-03-01 NOTE — Telephone Encounter (Signed)
Delice Bisonara advised. She states patient is now at [facility] ER. Delice Bisonara states she will placed SW order on hold to see if patient is going to be admitted.

## 2018-03-01 NOTE — ED Notes (Signed)
Attempted to get labs multiple times with no success. Lab called at this time for blood draw

## 2018-03-01 NOTE — ED Triage Notes (Signed)
Pt comes into the ED via EMS from home. Pt was seen here yesterday for chronic back pain. Pt states she went to get up from bed this morning and was too weak to stand. States EMS was called initially to help get the pt up but she was not able to stand after they got her up, pt continues to have lower back pain, denies any injury from the fall today. Pt is a/ox4. Pt is on PRN O2 at home, O2 sats on RA is 88%. Pt is legally blind and is hard or hearing. Pt states she has had a cough for the past several days.

## 2018-03-01 NOTE — ED Notes (Signed)
Pt cleaned up and repositioned in bed at this time 

## 2018-03-01 NOTE — ED Notes (Signed)
Pt back from CT

## 2018-03-01 NOTE — ED Notes (Signed)
Pt cleaned up and repositioned at this time 

## 2018-03-01 NOTE — Telephone Encounter (Signed)
Ok for verbal order for APS/SW  [name], Angela M, MD, MPH [facility] 03/01/2018 9:22 AM

## 2018-03-01 NOTE — Telephone Encounter (Signed)
I have made the 2nd attempt to contact the patient or family member in charge, in order to follow up from recently being discharged from the hospital. I left a message on voicemail requesting a CB. If pt does not CB after the end attempt, encounter will be closed.  -MM 

## 2018-03-01 NOTE — ED Notes (Signed)
Attempted to call report and per RN they are trying to figure out where pt is going. States she will call back shortly.

## 2018-03-01 NOTE — ED Notes (Signed)
Sam RN, aware of bed assigned  

## 2018-03-01 NOTE — ED Notes (Addendum)
Attempted to ambulate patient with walker. Pt was able to get up but then kept stating that she was weak and couldn't do it. Pt assisted back into bed with EDT Melanie. Linens changed and pt repositioned. MD aware

## 2018-03-01 NOTE — ED Notes (Signed)
Pt gone to CT 
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
## 2018-03-01 NOTE — Telephone Encounter (Signed)
Fabio Asaara Moser advised.    Needs a verbal order for social work/APS  For self neglect.  Pt refuses to take her medications, pt is now Urine and Bowel incontinent.  Contact:  [phone]   Thanks,   -Vernona RiegerLaura

## 2018-03-01 NOTE — ED Notes (Signed)
Date and time results received: 03/01/18 11:02 AM  (use smartphrase ".now" to insert current time)  Test: troponin Critical Value: 0.03  Name of Provider Notified: Malinda  Orders Received? Or Actions Taken?: MD notifed

## 2018-03-02 ENCOUNTER — Inpatient Hospital Stay: Payer: Medicare Other

## 2018-03-02 ENCOUNTER — Inpatient Hospital Stay: Payer: Self-pay | Admitting: Family Medicine

## 2018-03-02 DIAGNOSIS — Z7189 Other specified counseling: Secondary | ICD-10-CM

## 2018-03-02 DIAGNOSIS — J9601 Acute respiratory failure with hypoxia: Secondary | ICD-10-CM

## 2018-03-02 DIAGNOSIS — R531 Weakness: Secondary | ICD-10-CM

## 2018-03-02 DIAGNOSIS — Z515 Encounter for palliative care: Secondary | ICD-10-CM

## 2018-03-02 LAB — CBC
HEMATOCRIT: 24.9 % — AB (ref 36.0–46.0)
Hemoglobin: 7.8 g/dL — ABNORMAL LOW (ref 12.0–15.0)
MCH: 31.8 pg (ref 26.0–34.0)
MCHC: 31.3 g/dL (ref 30.0–36.0)
MCV: 101.6 fL — AB (ref 80.0–100.0)
PLATELETS: 156 10*3/uL (ref 150–400)
RBC: 2.45 MIL/uL — ABNORMAL LOW (ref 3.87–5.11)
RDW: 26.2 % — AB (ref 11.5–15.5)
WBC: 20.9 10*3/uL — ABNORMAL HIGH (ref 4.0–10.5)
nRBC: 0 % (ref 0.0–0.2)

## 2018-03-02 LAB — BLOOD GAS, ARTERIAL
ACID-BASE EXCESS: 6.6 mmol/L — AB (ref 0.0–2.0)
BICARBONATE: 30.5 mmol/L — AB (ref 20.0–28.0)
Delivery systems: POSITIVE
EXPIRATORY PAP: 6
FIO2: 0.4
Inspiratory PAP: 12
O2 Saturation: 96.7 %
PATIENT TEMPERATURE: 37
PH ART: 7.49 — AB (ref 7.350–7.450)
PO2 ART: 80 mmHg — AB (ref 83.0–108.0)
pCO2 arterial: 40 mmHg (ref 32.0–48.0)

## 2018-03-02 LAB — BASIC METABOLIC PANEL
Anion gap: 11 (ref 5–15)
Anion gap: 13 (ref 5–15)
BUN: 40 mg/dL — AB (ref 8–23)
BUN: 46 mg/dL — AB (ref 8–23)
CALCIUM: 7.8 mg/dL — AB (ref 8.9–10.3)
CALCIUM: 7.5 mg/dL — AB (ref 8.9–10.3)
CO2: 32 mmol/L (ref 22–32)
CO2: 28 mmol/L (ref 22–32)
CREATININE: 0.96 mg/dL (ref 0.44–1.00)
CREATININE: 1.23 mg/dL — AB (ref 0.44–1.00)
Chloride: 96 mmol/L — ABNORMAL LOW (ref 98–111)
Chloride: 96 mmol/L — ABNORMAL LOW (ref 98–111)
GFR calc Af Amer: 59 mL/min — ABNORMAL LOW (ref >60)
GFR calc Af Amer: 44 mL/min — ABNORMAL LOW (ref >60)
GFR calc non Af Amer: 51 mL/min — ABNORMAL LOW (ref >60)
GFR, EST NON AFRICAN AMERICAN: 38 mL/min — AB (ref >60)
GLUCOSE: 139 mg/dL — AB (ref 70–99)
GLUCOSE: 139 mg/dL — AB (ref 70–99)
Potassium: 2.7 mmol/L — CL (ref 3.5–5.1)
Potassium: 3.6 mmol/L (ref 3.5–5.1)
Sodium: 139 mmol/L (ref 135–145)
Sodium: 137 mmol/L (ref 135–145)

## 2018-03-02 LAB — GLUCOSE, CAPILLARY: Glucose-Capillary: 121 mg/dL — ABNORMAL HIGH (ref 70–99)

## 2018-03-02 LAB — PROCALCITONIN: PROCALCITONIN: 5.23 ng/mL

## 2018-03-02 LAB — MAGNESIUM: Magnesium: 2.1 mg/dL (ref 1.7–2.4)

## 2018-03-02 LAB — PHOSPHORUS: Phosphorus: 5 mg/dL — ABNORMAL HIGH (ref 2.5–4.6)

## 2018-03-02 LAB — TROPONIN I: Troponin I: 0.03 ng/mL (ref <0.03)

## 2018-03-02 MED ORDER — SODIUM CHLORIDE 0.9 % IV SOLN
INTRAVENOUS | Status: DC | PRN
  Administered 2018-03-02: 250 mL via INTRAVENOUS
  Administered 2018-03-07 – 2018-03-08 (×3): 500 mL via INTRAVENOUS
  Administered 2018-03-09: 1000 mL via INTRAVENOUS
  Administered 2018-03-09 (×2): 500 mL via INTRAVENOUS

## 2018-03-02 MED ORDER — POTASSIUM CHLORIDE 10 MEQ/100ML IV SOLN
10.0000 meq | INTRAVENOUS | Status: AC
  Administered 2018-03-02 (×2): 10 meq via INTRAVENOUS
  Filled 2018-03-02 (×2): qty 100

## 2018-03-02 MED ORDER — SODIUM CHLORIDE 0.9 % IV SOLN
500.0000 mg | INTRAVENOUS | Status: DC
  Administered 2018-03-02 – 2018-03-03 (×2): 500 mg via INTRAVENOUS
  Filled 2018-03-02 (×2): qty 500

## 2018-03-02 MED ORDER — SODIUM CHLORIDE 0.9 % IV SOLN
1.0000 g | INTRAVENOUS | Status: DC
  Administered 2018-03-02: 1 g via INTRAVENOUS
  Filled 2018-03-02: qty 10
  Filled 2018-03-02: qty 1

## 2018-03-02 MED ORDER — POTASSIUM CHLORIDE 10 MEQ/100ML IV SOLN
10.0000 meq | INTRAVENOUS | Status: AC
  Administered 2018-03-02 – 2018-03-03 (×4): 10 meq via INTRAVENOUS
  Filled 2018-03-02 (×4): qty 100

## 2018-03-02 MED ORDER — POTASSIUM CHLORIDE 10 MEQ/100ML IV SOLN
10.0000 meq | INTRAVENOUS | Status: DC
  Administered 2018-03-02 (×2): 10 meq via INTRAVENOUS
  Filled 2018-03-02 (×5): qty 100

## 2018-03-02 MED ORDER — POTASSIUM CHLORIDE 20 MEQ PO PACK
40.0000 meq | PACK | Freq: Two times a day (BID) | ORAL | Status: AC
  Administered 2018-03-02: 40 meq via ORAL
  Filled 2018-03-02: qty 2

## 2018-03-02 MED ORDER — SODIUM CHLORIDE 0.9 % IV BOLUS
500.0000 mL | Freq: Once | INTRAVENOUS | Status: AC
  Administered 2018-03-02: 500 mL via INTRAVENOUS

## 2018-03-02 MED ORDER — DOPAMINE-DEXTROSE 3.2-5 MG/ML-% IV SOLN
0.0000 ug/kg/min | INTRAVENOUS | Status: DC
  Administered 2018-03-03: 5 ug/kg/min via INTRAVENOUS
  Administered 2018-03-03: 15 ug/kg/min via INTRAVENOUS
  Filled 2018-03-02 (×2): qty 250

## 2018-03-02 MED ORDER — CEFTRIAXONE SODIUM 1 G IJ SOLR: 1.0000 g | INTRAMUSCULAR | Status: DC
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
## 2018-03-02 NOTE — Progress Notes (Addendum)
Code blue called. D/w Corrie DandyMary (Palliative care) who had d/w husband who still would like Full Code. I also d/w husband who was not ready to change code status.  Patient was bradycardic and unresponsive. Responded to 1 mg of Atropine.   Patient even last admission always requested Full Code.  Will place BiPAP and Transfer to ICU.  Critical care time: 15 mins

## 2018-03-02 NOTE — Progress Notes (Signed)
Patient ID: Shaune Spittleatricia S Hennington, female   DOB: [birth year], 84 y.o.   MRN: [id number]  Received call from Dr. Clelia CroftShaw regarding patient's decline and  need for rapid response.  She is currently in the ICU on BiPAP.  I was able to speak to her husband by telephone regarding any further escalation of care specific to intubation.  He understands the seriousness of the current medical situation however at this time he cannot make any decision regarding mutations of life prolonging measures.  He wishes the patient to remain a full code.  Questions and concerns addressed   Discussed with Mr Lesleigh NoeVandervelde the importance of continued conversation with the  medical providers regarding overall plan of care and treatment options,  ensuring decisions are within the  patients values,  GOCs and best interest.    Discussed with Dr Sherryll BurgerShah  No charge   Lorinda CreedMary Larach NP  Palliative Medicine Team Team Phone # [phone] Pager [phone]

## 2018-03-02 NOTE — Telephone Encounter (Signed)
Tried to contact pt twice and left two VM requesting a CB. Pt did not return my calls. No HFU apt is scheduled but there is a f/u apt scheduled for 03/17/18. FYI to PCP! -MM

## 2018-03-02 NOTE — Progress Notes (Signed)
PT Cancellation Note  Patient Details Name: Jaime [name] MRN: [id number] DOB: 10-25-1927   Cancelled Treatment:    Reason Eval/Treat Not Completed: Medical issues which prohibited therapy(Patient noted with transfer to CCU due to decline in status; currently intubated/sedated.  Per guidelines, will require new PT order to resume services.  Will complete order at this time; please re-consult as medically appropriate.)   Jaime [name], PT, DPT, NCS 03/02/18, 9:26 PM [phone]
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
## 2018-03-02 NOTE — Progress Notes (Addendum)
[facility] [place] [facility]   PATIENT NAME: Jaime [name]    MR#:  [id number]  DATE OF BIRTH:  1927/07/30  SUBJECTIVE: Since seen in ICU, off the BiPAP, appears slightly more comfortable with breathing but has back pain.  CHIEF COMPLAINT:   Chief Complaint  Patient presents with  . Back Pain  . Weakness  lethargic, in pain REVIEW OF SYSTEMS:   ROS lethargic DRUG ALLERGIES:   Allergies  Allergen Reactions  . Codeine Nausea And Vomiting    Pt states she can't take oxycodone.  She states she can take Percocet.  . Levofloxacin Other (See Comments)  . Oxycodone Nausea And Vomiting  . Prednisone     Patient states she had a stroke when she took prednisone before.  . Tramadol Hcl Anxiety    VITALS:  Blood pressure 125/66, pulse 80, temperature 97.6 F (36.4 C), temperature source Oral, resp. rate 18, height 5\' 2"  (1.575 m), weight 91.4 kg, SpO2 94 %. PHYSICAL EXAMINATION:  GENERAL:  84 y.o.-year-old patient lying in the bed with obvious respiratory distress, using accessory muscles of respiration. EYES: Pupils equal, round, reactive to light . No scleral icterus. Extraocular muscles intact.  HEENT: Head atraumatic, normocephalic. Oropharynx and nasopharynx clear.  NECK:  Supple, no jugular venous distention. No thyroid enlargement, no tenderness.  LUNGS: Bilateral coarse breath sounds present, basilar crepitus present. CARDIOVASCULAR: S1, S2 tachycardic. No murmurs, rubs, or gallops.  ABDOMEN: Soft, nontender, nondistended. Bowel sounds present. No organomegaly or mass.  EXTREMITIES: No pedal edema, cyanosis, or clubbing.  NEUROLOGIC: Patient is unstable unable to do full neuro exam because she is in respiratory distress.  But no gross neurological deficit is observed.Marland Kitchen.  PSYCHIATRIC: The patient is lethargic.  SKIN: No obvious rash, lesion, or ulcer. LABORATORY PANEL:   CBC Recent Labs  Lab 03/02/18 0531  WBC 20.9*  HGB 7.8*  HCT  24.9*  PLT 156   ------------------------------------------------------------------------------------------------------------------  Chemistries  Recent Labs  Lab 03/01/18 1017 03/02/18 0531  NA 138 139  K 4.6 2.7*  CL 94* 96*  CO2 31 32  GLUCOSE 171* 139*  BUN 50* 40*  CREATININE 1.07* 0.96  CALCIUM 8.1* 7.8*  AST 26  --   ALT 16  --   ALKPHOS 55  --   BILITOT 2.6*  --    ------------------------------------------------------------------------------------------------------------------  Cardiac Enzymes Recent Labs  Lab 03/01/18 1354  TROPONINI <0.03   ------------------------------------------------------------------------------------------------------------------  RADIOLOGY:  Ct Abdomen Pelvis W Contrast  Result Date: 03/01/2018 CLINICAL DATA:  Back pain and weakness. EXAM: CT ABDOMEN AND PELVIS WITH CONTRAST TECHNIQUE: Multidetector CT imaging of the abdomen and pelvis was performed using the standard protocol following bolus administration of intravenous contrast. CONTRAST:  75mL OMNIPAQUE IOHEXOL 300 MG/ML  SOLN COMPARISON:  CT lumbar spine dated February 17, 2018. CT abdomen pelvis dated November 28, 2015. FINDINGS: Lower chest: New small bilateral pleural effusions with bilateral lower lobe atelectasis. Unchanged large hiatal hernia. Hepatobiliary: No focal liver abnormality is seen. Scattered punctate calcifications are unchanged. Status post cholecystectomy. No biliary dilatation. Pancreas: No ductal dilatation or surrounding inflammatory changes. Spleen: Normal in size without focal abnormality. Scattered punctate calcifications are unchanged. Adrenals/Urinary Tract: The adrenal glands are unremarkable. Unchanged small bilateral renal cysts. No renal or ureteral calculi. No hydronephrosis. The bladder is decompressed. Stomach/Bowel: Unchanged large hiatal hernia. Appendix is normal. No bowel wall thickening, distention, or surrounding inflammatory changes. Mild sigmoid  colonic diverticulosis. Moderate colonic stool burden. Vascular/Lymphatic: Aortic atherosclerosis. No enlarged  abdominal or pelvic lymph nodes. Reproductive: Uterus and bilateral adnexa are unremarkable for the patient's age. Other: No free fluid or pneumoperitoneum. Musculoskeletal: No acute or significant osseous findings. Unchanged age-indeterminate T12 inferior endplate compression fracture. Unchanged chronic L4 superior endplate compression deformity. Stable severe degenerative changes at L1-L2 and L2-L3. IMPRESSION: 1.  No acute intra-abdominal process. 2. Unchanged age-indeterminate T12 inferior endplate compression deformity. 3. Unchanged advanced lumbar spondylosis. 4. Unchanged large hiatal hernia. 5. New small bilateral pleural effusions. 6.  Aortic atherosclerosis (ICD10-I70.0). Electronically Signed   By: Obie Dredge M.D.   On: 03/01/2018 13:04   Dg Chest Portable 1 View  Result Date: 03/01/2018 CLINICAL DATA:  Weakness and hypoxia. EXAM: PORTABLE CHEST 1 VIEW COMPARISON:  02/22/2018 FINDINGS: Cardiomegaly with increasing pulmonary vascular congestion noted. Continued mild bibasilar atelectasis noted. No pneumothorax or acute bony abnormality. Bilateral breast prosthesis and degenerative changes in the shoulders again noted. IMPRESSION: Cardiomegaly with increasing pulmonary vascular congestion. Electronically Signed   By: Harmon Pier M.D.   On: 03/01/2018 13:56    EKG:   Orders placed or performed during the hospital encounter of 03/01/18  . ED EKG  . ED EKG  . EKG 12-Lead  . EKG 12-Lead    ASSESSMENT AND PLAN:  84 year old female with past medical history of hypertension, previous history of spinal stenosis, CHF, compression fracture, osteoarthritis, chronic lymphedema of the lower extremities, chronic anemia who presents to the hospital due to back pain, difficulty ambulating.  1.  Back pain/difficulty ambulate- - Continue supportive care with pain control with oral tramadol  and IV Toradol. - Physical therapy consult to assess mobility.  Patient may need short-term rehab placement.  Consult social work.  2.  CHF- acute on chronic diastolic dysfunction.  Patient had some upper airway wheezing and rattling, her chest x-ray suggestive of pulmonary vascular congestion.   -continue IV Lasix, follow I's and O's and daily weights.   -Continue carvedilol, Cardizem.  3.  COPD- no acute exacerbation.  Continue Dulera, albuterol inhaler as needed. - PRN duo nebs  4.  Essential hypertension-continue carvedilol, Cardizem.  5.  GERD-continue Protonix.  Patient has had recurrent hospitalizations for similar reasons over the past 6 months.  Prognosis is guarded to poor.  Await palliative care consult to discuss goals of care.   I believe she is Hospice appropriate, at least start as DNR. She looks critically sick. If worsens, will need ICU  All the records are reviewed and case discussed with Care Management/Social Workerr. Management plans discussed with the patient, Cardio, nursing and they are in agreement.  CODE STATUS: Full code  TOTAL TIME (Critical Care) TAKING CARE OF THIS PATIENT: 35 minutes.   More than 50% time spent in counseling, coordination of care     Delfino Lovett M.D on 03/02/2018 at 11:23 AM  Between 7am to 6pm - Pager - [phone]  After 6pm go to [email or web address] - password [email or web address] [facility]  [place] [facility]  Office  [phone]  CC: Primary care physician; Erasmo Downer, MD   Note: This dictation was prepared with Dragon dictation along with smaller phrase technology. Any transcriptional errors that result from this process are unintentional.

## 2018-03-02 NOTE — Consult Note (Addendum)
Name: Jaime [name] MRN: [id number] DOB: 1927/07/12     CONSULTATION DATE: 03/01/2018  HISTORY OF PRESENT ILLNESS:    84 years old lady with a history of legal blindness since she was 84 years old, hard of hearing chronic diastolic congestive heart failure, chronic lymphedema both lower extremities, COPD, chronic respiratory failure on home O2.  Osteoarthritis, chronic anemia, hypertension, chronic back pain and multiple orthopedic surgeries including bilateral knee replacements.  Patient is admitted to the hospital with generalized weakness and hypoxemia. While in the floor CODE BLUE was activated, patient was found to have bradycardia with wide QRS complex however did not lose pulse.  All history was obtained from Dr. Sherryll Burger primary care who had mentioned that the patient improved after receiving 1 amp of atropine and she was initially found to be on responsive.  Patient was initiated on a BiPAP and was transferred to intensive care unit for further management. All history was obtained the form Dr. Sherryll Burger primary care, husband at the bedside and EMR. Patient arrived to the intensive care unit awake, following commands in no distress, denied chest pain and tolerating BiPAP.  PAST MEDICAL HISTORY :   has a past medical history of Anemia, Anemia, Arthritis, Blind, Bronchitis, CHF (congestive heart failure) (HCC), Compression fracture, GERD (gastroesophageal reflux disease), H/O hiatal hernia, Herniated disc, Hypertension, Lymphedema of left leg, and MRSA (methicillin resistant staph aureus) culture positive.  has a past surgical history that includes C secton ; Knee arthroscopy; Breast enhancement surgery; Total knee arthroplasty (10/18/2011); and Craniotomy (N/A, 08/01/2014). Prior to Admission medications   Medication Sig Start Date End Date Taking? Authorizing Provider  carvedilol (COREG) 6.25 MG tablet Take 1 tablet (6.25 mg total) by mouth 2 (two) times daily with a meal. 02/22/18  Yes Delfino Lovett, MD  Cholecalciferol 1000 UNITS tablet Take 1,000 Units by mouth daily.   Yes [provider]  CIPRODEX OTIC suspension Place 1 drop into both ears daily as needed for pain. 09/08/17  Yes [provider]  diclofenac sodium (VOLTAREN) 1 % GEL Apply 2 g topically 4 (four) times daily. 08/04/17  Yes Bacigalupo, Marzella Schlein, MD  diltiazem (CARDIZEM CD) 240 MG 24 hr capsule Take 1 capsule (240 mg total) by mouth daily. 02/26/18 03/28/18 Yes Pyreddy, Vivien Rota, MD  fluticasone (FLONASE) 50 MCG/ACT nasal spray Place 1 spray into the nose daily as needed for allergies.  02/06/14 06/19/27 Yes [provider]  Fluticasone-Salmeterol (ADVAIR DISKUS) 100-50 MCG/DOSE AEPB Inhale 1 puff into the lungs 2 (two) times daily as needed.  09/07/17  Yes [provider]  furosemide (LASIX) 20 MG tablet Take 1 tablet (20 mg total) by mouth daily as needed for edema. 02/22/18 02/22/19 Yes Delfino Lovett, MD  ibuprofen (ADVIL,MOTRIN) 600 MG tablet Take 1 tablet (600 mg total) by mouth 4 (four) times daily. 02/25/18  Yes Pyreddy, Vivien Rota, MD  IRON PO Take 65 mg by mouth daily.   Yes [provider]  magnesium oxide (MAG-OX) 400 MG tablet Take 400 mg by mouth daily.   Yes [provider]  mupirocin ointment (BACTROBAN) 2 % Apply 1 application topically daily as needed.  02/13/18  Yes [provider]  MYRBETRIQ 25 MG TB24 tablet Take 1 tablet by mouth daily. 01/11/18  Yes [provider]  omeprazole (PRILOSEC) 20 MG capsule Take 1 capsule (20 mg total) by mouth daily. 08/23/14  Yes Angiulli, Mcarthur Rossetti, PA-C  polyvinyl alcohol (LIQUIFILM TEARS) 1.4 % ophthalmic solution Place 1 drop into  both eyes as needed for dry eyes.   Yes [provider]  predniSONE (DELTASONE) 10 MG tablet Take 1 tablet (10 mg total) by mouth daily. 6 tabs PO x 1 day 5 tabs PO x 1 day 4 tabs PO x 1 day 3 tabs PO x 1 day 2 tabs PO x 1 day 1 tab PO x 1 day and stop 02/26/18  Yes Enid BaasKalisetti,  Radhika, MD  risperiDONE (RISPERDAL) 0.25 MG tablet Take 1 tablet (0.25 mg total) by mouth at bedtime. 02/22/18  Yes Delfino LovettShah, Vipul, MD  VENTOLIN HFA 108 (90 BASE) MCG/ACT inhaler Inhale 2 puffs into the lungs every 4 (four) hours as needed for wheezing or shortness of breath.  05/07/14  Yes [provider]   Allergies  Allergen Reactions  . Codeine Nausea And Vomiting    Pt states she can't take oxycodone.  She states she can take Percocet.  . Levofloxacin Other (See Comments)  . Oxycodone Nausea And Vomiting  . Prednisone     Patient states she had a stroke when she took prednisone before.  . Tramadol Hcl Anxiety    FAMILY HISTORY:  family history includes Cancer in her father and sister; Healthy in her mother. SOCIAL HISTORY:  reports that she quit smoking about 30 years ago. Her smoking use included cigarettes. She has a 6.00 pack-year smoking history. She has never used smokeless tobacco. She reports that she does not drink alcohol or use drugs.  REVIEW OF SYSTEMS:   Unable to obtain due to critical illness   VITAL SIGNS: Temp:  [97.6 F (36.4 C)-98 F (36.7 C)] 97.6 F (36.4 C) (11/21 0759) Pulse Rate:  [48-105] 48 (11/21 1416) Resp:  [18-29] 28 (11/21 1415) BP: (109-160)/(46-88) 109/46 (11/21 1415) SpO2:  [70 %-97 %] 70 % (11/21 1416) Weight:  [91.4 kg] 91.4 kg (11/20 2020)  Physical Examination:  Awake, oriented, legally blind and hard of hearing.  Moving all extremities with no focal motor deficits Tolerating BiPAP, no distress, bilateral equal air entry and no adventitious sounds S1 & S2 are audible with no murmur Benign abdominal exam with feeble peristalsis Wasted extremities with bilateral leg edema  ASSESSMENT / PLAN: Acute on chronic respiratory failure with baseline home O2 as per H&P.  Tolerating BiPAP. -Monitor ABG, optimize BiPAP settings, monitor work of breathing and consider intubation if no improvement  Bradycardia QRS complexes reported  during the event of CODE BLUE.  No chest pain.  H/o HFpEF. Echocardiogram 02/22/2018 LVEF 60 to 65% with normal wall motion -Monitor electrolytes, EKG, cardiac enzymes. -Gentle diuresis  Pneumonia.  Bibasilar airspace disease and small left pleural effusion -Rocephin + Zithromax. -Gentle diuresis to improve lung compliance -Monitor CXR + CBC + FiO2, procalcitonin  COPD -Optimize bronchodilators  UTI -Empiric Rocephin and follow urine culture  Anemia -Keep hemoglobin more than 7 g/dL  Chronic pains with degenerative joint disease status post multiple orthopedic surgeries -Optimize analgesia  Hypokalemia -replete and monitor electrolytes  Full code  DVT & GI prophylaxis.  Continue with supportive care Mr. [name] was updated at the bedside and he agrees with the plan of care  Critical care time 50 minutes

## 2018-03-02 NOTE — Progress Notes (Signed)
Was called by CCMD that patient had a HR in the low 40's.  This was new for her.  Found her lethargic.  VS: HR 52, SpO2 was 75.  Increased oxygen to 10 L, SpO2 dropped to 60. Called a rapid response which quickly turned to the Code when she needed to be bagged to increase her SpO2.  Patient transferred to ICU by BIPAP therapy.

## 2018-03-02 NOTE — Telephone Encounter (Signed)
Patient has been readmitted to the hospital.  We will need to follow to see when she is discharged again.

## 2018-03-02 NOTE — Progress Notes (Signed)
PT Cancellation Note  Patient Details Name: Jaime [name] MRN: [id number] DOB: [birth year]   Cancelled Treatment:    Reason Eval/Treat Not Completed: Patient's level of consciousness(Chart reviewed, RN consulted. Attempt eval again 2nd time this date. Pt in supine, lethargic, O2 doffed, and hearing aids doffed. SpO2: 81% on room air. ) Pt is lethargic, heavily slurred speech, responding to questioning <50% of the time. Pt reports substantial pain 'al over'. Nasal canual is put back on patient, and hearing aids placed carefully on countertop. Pt unable to particpate in evaluation at this time. Will attempt again at later date/time as patient is able to participate.   1:14 PM, 03/02/18 Jaime [name], PT, DPT Physical Therapist [facility]  [phone] (ASCOM)      [name],Jaime [name] 03/02/2018, 1:11 PM

## 2018-03-02 NOTE — Progress Notes (Signed)
   03/02/18 1410  Clinical Encounter Type  Visited With Patient not available;Health care provider  Visit Type Code  Spiritual Encounters  Spiritual Needs Prayer   Rapid response changed to code blue.  Per staff, spouse not present.  Chaplain offered silent and energetic prayers for patient and care team through their work.  Chaplain exited as team prepared to move patient to ICU.

## 2018-03-03 ENCOUNTER — Inpatient Hospital Stay
Admit: 2018-03-03 | Discharge: 2018-03-03 | Disposition: A | Discharge status: Home / Self Care | Payer: Medicare Other | Attending: Infectious Diseases | Admitting: Infectious Diseases

## 2018-03-03 DIAGNOSIS — R7881 Bacteremia: Secondary | ICD-10-CM

## 2018-03-03 DIAGNOSIS — J449 Chronic obstructive pulmonary disease, unspecified: Secondary | ICD-10-CM

## 2018-03-03 DIAGNOSIS — Z9181 History of falling: Secondary | ICD-10-CM

## 2018-03-03 DIAGNOSIS — R32 Unspecified urinary incontinence: Secondary | ICD-10-CM

## 2018-03-03 DIAGNOSIS — Z96653 Presence of artificial knee joint, bilateral: Secondary | ICD-10-CM

## 2018-03-03 DIAGNOSIS — R159 Full incontinence of feces: Secondary | ICD-10-CM

## 2018-03-03 DIAGNOSIS — Z888 Allergy status to other drugs, medicaments and biological substances status: Secondary | ICD-10-CM

## 2018-03-03 DIAGNOSIS — H9190 Unspecified hearing loss, unspecified ear: Secondary | ICD-10-CM

## 2018-03-03 DIAGNOSIS — B9561 Methicillin susceptible Staphylococcus aureus infection as the cause of diseases classified elsewhere: Secondary | ICD-10-CM

## 2018-03-03 DIAGNOSIS — H548 Legal blindness, as defined in USA: Secondary | ICD-10-CM

## 2018-03-03 DIAGNOSIS — N179 Acute kidney failure, unspecified: Secondary | ICD-10-CM

## 2018-03-03 DIAGNOSIS — M4856XA Collapsed vertebra, not elsewhere classified, lumbar region, initial encounter for fracture: Secondary | ICD-10-CM

## 2018-03-03 DIAGNOSIS — F22 Delusional disorders: Secondary | ICD-10-CM

## 2018-03-03 DIAGNOSIS — Z881 Allergy status to other antibiotic agents status: Secondary | ICD-10-CM

## 2018-03-03 DIAGNOSIS — Z885 Allergy status to narcotic agent status: Secondary | ICD-10-CM

## 2018-03-03 DIAGNOSIS — J9621 Acute and chronic respiratory failure with hypoxia: Secondary | ICD-10-CM

## 2018-03-03 DIAGNOSIS — Z87891 Personal history of nicotine dependence: Secondary | ICD-10-CM

## 2018-03-03 DIAGNOSIS — Z87820 Personal history of traumatic brain injury: Secondary | ICD-10-CM

## 2018-03-03 LAB — BASIC METABOLIC PANEL
Anion gap: 11 (ref 5–15)
Anion gap: 17 — ABNORMAL HIGH (ref 5–15)
BUN: 53 mg/dL — ABNORMAL HIGH (ref 8–23)
BUN: 52 mg/dL — ABNORMAL HIGH (ref 8–23)
CHLORIDE: 98 mmol/L (ref 98–111)
CO2: 24 mmol/L (ref 22–32)
CO2: 27 mmol/L (ref 22–32)
CREATININE: 1.18 mg/dL — AB (ref 0.44–1.00)
CREATININE: 1.28 mg/dL — AB (ref 0.44–1.00)
Calcium: 7.4 mg/dL — ABNORMAL LOW (ref 8.9–10.3)
Calcium: 7.5 mg/dL — ABNORMAL LOW (ref 8.9–10.3)
Chloride: 100 mmol/L (ref 98–111)
GFR calc non Af Amer: 36 mL/min — ABNORMAL LOW (ref >60)
GFR, EST AFRICAN AMERICAN: 42 mL/min — AB (ref >60)
GFR, EST AFRICAN AMERICAN: 46 mL/min — AB (ref >60)
GFR, EST NON AFRICAN AMERICAN: 40 mL/min — AB (ref >60)
Glucose, Bld: 127 mg/dL — ABNORMAL HIGH (ref 70–99)
Glucose, Bld: 136 mg/dL — ABNORMAL HIGH (ref 70–99)
POTASSIUM: 3.7 mmol/L (ref 3.5–5.1)
Potassium: 3.6 mmol/L (ref 3.5–5.1)
SODIUM: 138 mmol/L (ref 135–145)
SODIUM: 139 mmol/L (ref 135–145)

## 2018-03-03 LAB — BLOOD CULTURE ID PANEL (REFLEXED)
Acinetobacter baumannii: NOT DETECTED
CANDIDA ALBICANS: NOT DETECTED
CANDIDA GLABRATA: NOT DETECTED
CANDIDA PARAPSILOSIS: NOT DETECTED
Candida krusei: NOT DETECTED
Candida tropicalis: NOT DETECTED
ENTEROBACTER CLOACAE COMPLEX: NOT DETECTED
ENTEROBACTERIACEAE SPECIES: NOT DETECTED
ENTEROCOCCUS SPECIES: NOT DETECTED
Escherichia coli: NOT DETECTED
Haemophilus influenzae: NOT DETECTED
KLEBSIELLA OXYTOCA: NOT DETECTED
Klebsiella pneumoniae: NOT DETECTED
Listeria monocytogenes: NOT DETECTED
Methicillin resistance: NOT DETECTED
Neisseria meningitidis: NOT DETECTED
Proteus species: NOT DETECTED
Pseudomonas aeruginosa: NOT DETECTED
Serratia marcescens: NOT DETECTED
Staphylococcus aureus (BCID): DETECTED — AB
Staphylococcus species: DETECTED — AB
Streptococcus agalactiae: NOT DETECTED
Streptococcus pneumoniae: NOT DETECTED
Streptococcus pyogenes: NOT DETECTED
Streptococcus species: NOT DETECTED

## 2018-03-03 LAB — CBC
HEMATOCRIT: 25.4 % — AB (ref 36.0–46.0)
HEMOGLOBIN: 7.9 g/dL — AB (ref 12.0–15.0)
MCH: 32 pg (ref 26.0–34.0)
MCHC: 31.1 g/dL (ref 30.0–36.0)
MCV: 102.8 fL — ABNORMAL HIGH (ref 80.0–100.0)
NRBC: 0 % (ref 0.0–0.2)
Platelets: 177 10*3/uL (ref 150–400)
RBC: 2.47 MIL/uL — ABNORMAL LOW (ref 3.87–5.11)
RDW: 26.4 % — ABNORMAL HIGH (ref 11.5–15.5)
WBC: 23.1 10*3/uL — AB (ref 4.0–10.5)

## 2018-03-03 LAB — CBC WITH DIFFERENTIAL/PLATELET
Abs Immature Granulocytes: 0.33 10*3/uL — ABNORMAL HIGH (ref 0.00–0.07)
BASOS PCT: 0 %
Basophils Absolute: 0 10*3/uL (ref 0.0–0.1)
EOS PCT: 0 %
Eosinophils Absolute: 0.1 10*3/uL (ref 0.0–0.5)
HCT: 23.2 % — ABNORMAL LOW (ref 36.0–46.0)
Hemoglobin: 7.3 g/dL — ABNORMAL LOW (ref 12.0–15.0)
Immature Granulocytes: 2 %
Lymphocytes Relative: 3 %
Lymphs Abs: 0.6 10*3/uL — ABNORMAL LOW (ref 0.7–4.0)
MCH: 32 pg (ref 26.0–34.0)
MCHC: 31.5 g/dL (ref 30.0–36.0)
MCV: 101.8 fL — ABNORMAL HIGH (ref 80.0–100.0)
Monocytes Absolute: 0.8 10*3/uL (ref 0.1–1.0)
Monocytes Relative: 4 %
Neutro Abs: 18 10*3/uL — ABNORMAL HIGH (ref 1.7–7.7)
Neutrophils Relative %: 91 %
PLATELETS: 193 10*3/uL (ref 150–400)
RBC: 2.28 MIL/uL — AB (ref 3.87–5.11)
RDW: 25.7 % — ABNORMAL HIGH (ref 11.5–15.5)
WBC: 19.9 10*3/uL — AB (ref 4.0–10.5)
nRBC: 0 % (ref 0.0–0.2)

## 2018-03-03 LAB — TROPONIN I
TROPONIN I: 0.08 ng/mL — AB (ref <0.03)
TROPONIN I: 0.06 ng/mL — AB (ref <0.03)

## 2018-03-03 LAB — MAGNESIUM: Magnesium: 2.6 mg/dL — ABNORMAL HIGH (ref 1.7–2.4)

## 2018-03-03 LAB — CALCIUM, IONIZED: Calcium, Ionized, Serum: 4.5 mg/dL (ref 4.5–5.6)

## 2018-03-03 MED ORDER — CEFAZOLIN SODIUM-DEXTROSE 2-4 GM/100ML-% IV SOLN
2.0000 g | Freq: Three times a day (TID) | INTRAVENOUS | Status: DC
  Administered 2018-03-03 – 2018-03-13 (×30): 2 g via INTRAVENOUS
  Filled 2018-03-03 (×37): qty 100

## 2018-03-03 NOTE — Progress Notes (Signed)
Pt's daughter Lynden AngVicky at bedside, attempted to speak with her mother regarding code status.  Per Vicky her mother still wishes to remain a full code, even given knowledge of what that will entail.  Vicky verbalized to writing RN that she is not sure this is the best course of action for her mother, but does not feel that she should go against her mother's wishes.

## 2018-03-03 NOTE — Procedures (Signed)
Central Venous Catheter Insertion Procedure Note Jaime [name] [id number] [birth year]  Procedure: Insertion of Central Venous Catheter Indications: Assessment of intravascular volume, Drug and/or fluid administration and Frequent blood sampling  Procedure Details Consent: Risks of procedure as well as the alternatives and risks of each were explained to the (patient/caregiver).  Consent for procedure obtained. Time Out: Verified patient identification, verified procedure, site/side was marked, verified correct patient position, special equipment/implants available, medications/allergies/relevent history reviewed, required imaging and test results available.  Performed  Maximum sterile technique was used including antiseptics, cap, gloves, gown, hand hygiene, mask and sheet. Skin prep: Chlorhexidine; local anesthetic administered A antimicrobial bonded/coated triple lumen catheter was placed in the left femoral vein due to multiple attempts, no other available access using the Seldinger technique.  Evaluation Blood flow good Complications: No apparent complications Patient did tolerate procedure well. Chest X-ray ordered to verify placement.  CXR: Not needed, placed in left femoral .  Procedure was performed using Ultrasound to allow for direct visualization of vessel cannulization of Left Femoral Vein.    Jaime DittyJeremiah Azjah [name], AGACNP-BC [facility] Pager: [phone] Cell: [phone]   Jaime ModestJeremiah [name] Jaime [name] 03/03/2018, 4:11 AM
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
## 2018-03-03 NOTE — Progress Notes (Signed)
*  PRELIMINARY RESULTS* Echocardiogram 2D Echocardiogram has been performed.  Joanette GulaJoan M Brookelin Felber 03/03/2018, 1:10 PM

## 2018-03-03 NOTE — Progress Notes (Signed)
[facility] [place] [facility]   PATIENT NAME: Jaime [name]    MR#:  [id number]  DATE OF BIRTH:  1927-10-16  SUBJECTIVE: Since seen in ICU, off the BiPAP, appears slightly more comfortable with breathing but has back pain.  CHIEF COMPLAINT:   Chief Complaint  Patient presents with  . Back Pain  . Weakness  lethargic, requesting ice-chips when wakes up. Husband at bedside REVIEW OF SYSTEMS:  ROS lethargic DRUG ALLERGIES:   Allergies  Allergen Reactions  . Codeine Nausea And Vomiting    Pt states she can't take oxycodone.  She states she can take Percocet.  . Levofloxacin Other (See Comments)  . Oxycodone Nausea And Vomiting  . Prednisone     Patient states she had a stroke when she took prednisone before.  . Tramadol Hcl Anxiety    VITALS:  Blood pressure (!) 132/59, pulse (!) 54, temperature 98.6 F (37 C), temperature source Axillary, resp. rate 19, height 5\' 2"  (1.575 m), weight 91.4 kg, SpO2 96 %. PHYSICAL EXAMINATION:  GENERAL:  84 y.o.-year-old patient lying in the bed with obvious respiratory distress, using accessory muscles of respiration. EYES: Pupils equal, round, reactive to light . No scleral icterus. Extraocular muscles intact.  HEENT: Head atraumatic, normocephalic. Oropharynx and nasopharynx clear.  NECK:  Supple, no jugular venous distention. No thyroid enlargement, no tenderness.  LUNGS: Bilateral coarse breath sounds present, basilar crepitus present. CARDIOVASCULAR: S1, S2 tachycardic. No murmurs, rubs, or gallops.  ABDOMEN: Soft, nontender, nondistended. Bowel sounds present. No organomegaly or mass.  EXTREMITIES: No pedal edema, cyanosis, or clubbing.  NEUROLOGIC: Nonfocal. Legally Blind PSYCHIATRIC: The patient is lethargic.  SKIN: No obvious rash, lesion, or ulcer. LABORATORY PANEL:   CBC Recent Labs  Lab 03/03/18 0621  WBC 23.1*  HGB 7.9*  HCT 25.4*  PLT 177    ------------------------------------------------------------------------------------------------------------------  Chemistries  Recent Labs  Lab 03/01/18 1017  03/03/18 0031 03/03/18 0621  NA 138   < > 138 139  K 4.6   < > 3.6 3.7  CL 94*   < > 100 98  CO2 31   < > 27 24  GLUCOSE 171*   < > 127* 136*  BUN 50*   < > 53* 52*  CREATININE 1.07*   < > 1.18* 1.28*  CALCIUM 8.1*   < > 7.4* 7.5*  MG  --    < > 2.6*  --   AST 26  --   --   --   ALT 16  --   --   --   ALKPHOS 55  --   --   --   BILITOT 2.6*  --   --   --    < > = values in this interval not displayed.   ------------------------------------------------------------------------------------------------------------------  Cardiac Enzymes Recent Labs  Lab 03/03/18 0621  TROPONINI 0.08*   ------------------------------------------------------------------------------------------------------------------  RADIOLOGY:  Ct Abdomen Pelvis W Contrast  Result Date: 03/01/2018 CLINICAL DATA:  Back pain and weakness. EXAM: CT ABDOMEN AND PELVIS WITH CONTRAST TECHNIQUE: Multidetector CT imaging of the abdomen and pelvis was performed using the standard protocol following bolus administration of intravenous contrast. CONTRAST:  75mL OMNIPAQUE IOHEXOL 300 MG/ML  SOLN COMPARISON:  CT lumbar spine dated February 17, 2018. CT abdomen pelvis dated November 28, 2015. FINDINGS: Lower chest: New small bilateral pleural effusions with bilateral lower lobe atelectasis. Unchanged large hiatal hernia. Hepatobiliary: No focal liver abnormality is seen. Scattered punctate calcifications are unchanged. Status post cholecystectomy.  No biliary dilatation. Pancreas: No ductal dilatation or surrounding inflammatory changes. Spleen: Normal in size without focal abnormality. Scattered punctate calcifications are unchanged. Adrenals/Urinary Tract: The adrenal glands are unremarkable. Unchanged small bilateral renal cysts. No renal or ureteral calculi. No  hydronephrosis. The bladder is decompressed. Stomach/Bowel: Unchanged large hiatal hernia. Appendix is normal. No bowel wall thickening, distention, or surrounding inflammatory changes. Mild sigmoid colonic diverticulosis. Moderate colonic stool burden. Vascular/Lymphatic: Aortic atherosclerosis. No enlarged abdominal or pelvic lymph nodes. Reproductive: Uterus and bilateral adnexa are unremarkable for the patient's age. Other: No free fluid or pneumoperitoneum. Musculoskeletal: No acute or significant osseous findings. Unchanged age-indeterminate T12 inferior endplate compression fracture. Unchanged chronic L4 superior endplate compression deformity. Stable severe degenerative changes at L1-L2 and L2-L3. IMPRESSION: 1.  No acute intra-abdominal process. 2. Unchanged age-indeterminate T12 inferior endplate compression deformity. 3. Unchanged advanced lumbar spondylosis. 4. Unchanged large hiatal hernia. 5. New small bilateral pleural effusions. 6.  Aortic atherosclerosis (ICD10-I70.0). Electronically Signed   By: Obie Dredge M.D.   On: 03/01/2018 13:04   Dg Chest Port 1 View  Result Date: 03/02/2018 CLINICAL DATA:  Patient with code. EXAM: PORTABLE CHEST 1 VIEW COMPARISON:  03/01/2018 FINDINGS: Pacer pad overlies the patient. Stable cardiomegaly. Diffuse bilateral interstitial pulmonary opacities. Small bilateral pleural effusions. Thoracic spine degenerative changes. IMPRESSION: Cardiomegaly with mild interstitial edema. Electronically Signed   By: Annia Belt M.D.   On: 03/02/2018 14:54   Dg Chest Portable 1 View  Result Date: 03/01/2018 CLINICAL DATA:  Weakness and hypoxia. EXAM: PORTABLE CHEST 1 VIEW COMPARISON:  02/22/2018 FINDINGS: Cardiomegaly with increasing pulmonary vascular congestion noted. Continued mild bibasilar atelectasis noted. No pneumothorax or acute bony abnormality. Bilateral breast prosthesis and degenerative changes in the shoulders again noted. IMPRESSION: Cardiomegaly with  increasing pulmonary vascular congestion. Electronically Signed   By: Harmon Pier M.D.   On: 03/01/2018 13:56    EKG:   Orders placed or performed during the hospital encounter of 03/01/18  . ED EKG  . ED EKG  . EKG 12-Lead  . EKG 12-Lead  . EKG 12-Lead  . EKG 12-Lead    ASSESSMENT AND PLAN:  84 year old female with past medical history of hypertension, previous history of spinal stenosis, CHF, compression fracture, osteoarthritis, chronic lymphedema of the lower extremities, chronic anemia who presents to the hospital due to back pain, difficulty ambulating.  * bradycardia, unresponsiveness: y'day afternoon - code blued called and transfered to ICU - now seem to have resolved. More awake than y'day, could be due to overuse of pain meds, still HR in 50s, avoid rate controlling agents - Hold Coreg  * Acute on chronic hypoxic resp failure - needing BiPAP, per PCCM  * chronic Back pain/difficulty ambulate- - Continue supportive care with pain meds as need - Physical therapy consult to assess mobility.  Patient may need short-term rehab placement.  Consult social work.  * CHF- acute on chronic diastolic dysfunction: -continue IV Lasix 20 mg BID, follow I's and O's and daily weights.   -Continue Cardizem.hold Coreg  * COPD- no acute exacerbation.  Continue Dulera, albuterol inhaler as needed. - PRN duo nebs  * Essential hypertension-continue Cardizem. Hold coreg  * GERD-continue Protonix.  Patient has had recurrent hospitalizations for similar reasons over the past 6 months.  Prognosis is guarded to poor.  Palliative care also continues to have discussion but patient/husband not realistic. Requests full code    All the records are reviewed and case discussed with Care Management/Social Workerr. Management plans discussed  with the patient, Cardio, nursing and they are in agreement.  CODE STATUS: Full code  TOTAL TIME TAKING CARE OF THIS PATIENT: 35 minutes.   More  than 50% time spent in counseling, coordination of care     Delfino Lovett M.D on 03/03/2018 at 11:20 AM  Between 7am to 6pm - Pager - [phone]  After 6pm go to [email or web address] - password [email or web address] [facility]  [facility]  Office  [phone]  CC: Primary care physician; Erasmo Downer, MD   Note: This dictation was prepared with Dragon dictation along with smaller phrase technology. Any transcriptional errors that result from this process are unintentional.
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
## 2018-03-03 NOTE — Progress Notes (Signed)
PHARMACY - PHYSICIAN COMMUNICATION CRITICAL VALUE ALERT - BLOOD CULTURE IDENTIFICATION (BCID)  Shaune Spittleatricia S Segler is an 84 y.o. female [facility] with a chief complaint of Weakness  Assessment:  Resp failure, PNA,  BCID: MSSA bacteremia   Name of physician (or Provider) Contacted: None - Dr. Rivka Saferavishankar (ID) following.   Current antibiotics: Cefazolin, azithromycin    Changes to prescribed antibiotics recommended:  Patient is on recommended antibiotics - No changes needed  Results for orders placed or performed during the hospital encounter of 03/01/18  Blood Culture ID Panel (Reflexed) (Collected: 03/02/2018  3:39 PM)  Result Value Ref Range   Enterococcus species NOT DETECTED NOT DETECTED   Listeria monocytogenes NOT DETECTED NOT DETECTED   Staphylococcus species DETECTED (A) NOT DETECTED   Staphylococcus aureus (BCID) DETECTED (A) NOT DETECTED   Methicillin resistance NOT DETECTED NOT DETECTED   Streptococcus species NOT DETECTED NOT DETECTED   Streptococcus agalactiae NOT DETECTED NOT DETECTED   Streptococcus pneumoniae NOT DETECTED NOT DETECTED   Streptococcus pyogenes NOT DETECTED NOT DETECTED   Acinetobacter baumannii NOT DETECTED NOT DETECTED   Enterobacteriaceae species NOT DETECTED NOT DETECTED   Enterobacter cloacae complex NOT DETECTED NOT DETECTED   Escherichia coli NOT DETECTED NOT DETECTED   Klebsiella oxytoca NOT DETECTED NOT DETECTED   Klebsiella pneumoniae NOT DETECTED NOT DETECTED   Proteus species NOT DETECTED NOT DETECTED   Serratia marcescens NOT DETECTED NOT DETECTED   Haemophilus influenzae NOT DETECTED NOT DETECTED   Neisseria meningitidis NOT DETECTED NOT DETECTED   Pseudomonas aeruginosa NOT DETECTED NOT DETECTED   Candida albicans NOT DETECTED NOT DETECTED   Candida glabrata NOT DETECTED NOT DETECTED   Candida krusei NOT DETECTED NOT DETECTED   Candida parapsilosis NOT DETECTED NOT DETECTED   Candida tropicalis NOT  DETECTED NOT DETECTED    Gardner CandleSheema M Michalina Calbert, PharmD, BCPS Clinical Pharmacist 03/03/2018 8:55 AM

## 2018-03-03 NOTE — Progress Notes (Signed)
CRITICAL VALUE STICKER  CRITICAL VALUE: Troponin 0.08  RECEIVER (on-site recipient of call): Johnsie CancelA Carlisa Eble RN  DATE & TIME NOTIFIED: 11/22/20190  0701  MESSENGER (representative from lab):  MD NOTIFIED: Harlon DittyJeremiah Keene, CCU NP  TIME OF NOTIFICATION: 0703 RESPONSE: Acknowledged

## 2018-03-04 DIAGNOSIS — J9600 Acute respiratory failure, unspecified whether with hypoxia or hypercapnia: Secondary | ICD-10-CM

## 2018-03-04 DIAGNOSIS — M4855XA Collapsed vertebra, not elsewhere classified, thoracolumbar region, initial encounter for fracture: Secondary | ICD-10-CM

## 2018-03-04 LAB — COMPREHENSIVE METABOLIC PANEL
ALT: 11 U/L (ref 0–44)
AST: 10 U/L — ABNORMAL LOW (ref 15–41)
Albumin: 2.2 g/dL — ABNORMAL LOW (ref 3.5–5.0)
Alkaline Phosphatase: 60 U/L (ref 38–126)
Anion gap: 7 (ref 5–15)
BILIRUBIN TOTAL: 0.7 mg/dL (ref 0.3–1.2)
BUN: 38 mg/dL — ABNORMAL HIGH (ref 8–23)
CHLORIDE: 99 mmol/L (ref 98–111)
CO2: 32 mmol/L (ref 22–32)
CREATININE: 0.78 mg/dL (ref 0.44–1.00)
Calcium: 7.2 mg/dL — ABNORMAL LOW (ref 8.9–10.3)
GFR calc Af Amer: >60 mL/min (ref >60)
Glucose, Bld: 167 mg/dL — ABNORMAL HIGH (ref 70–99)
POTASSIUM: 2.8 mmol/L — AB (ref 3.5–5.1)
Sodium: 138 mmol/L (ref 135–145)
Total Protein: 5.4 g/dL — ABNORMAL LOW (ref 6.5–8.1)

## 2018-03-04 LAB — PHOSPHORUS: Phosphorus: 2.3 mg/dL — ABNORMAL LOW (ref 2.5–4.6)

## 2018-03-04 LAB — MAGNESIUM: Magnesium: 2.2 mg/dL (ref 1.7–2.4)

## 2018-03-04 LAB — CBC
HCT: 21.4 % — ABNORMAL LOW (ref 36.0–46.0)
Hemoglobin: 6.7 g/dL — ABNORMAL LOW (ref 12.0–15.0)
MCH: 31.8 pg (ref 26.0–34.0)
MCHC: 31.3 g/dL (ref 30.0–36.0)
MCV: 101.4 fL — ABNORMAL HIGH (ref 80.0–100.0)
Platelets: 209 10*3/uL (ref 150–400)
RBC: 2.11 MIL/uL — AB (ref 3.87–5.11)
RDW: 25.9 % — ABNORMAL HIGH (ref 11.5–15.5)
WBC: 14.4 10*3/uL — AB (ref 4.0–10.5)
nRBC: 0 % (ref 0.0–0.2)

## 2018-03-04 LAB — ECHOCARDIOGRAM LIMITED
Height: 62 in
WEIGHTICAEL: 3224.01 [oz_av]

## 2018-03-04 LAB — TROPONIN I: Troponin I: 0.03 ng/mL (ref <0.03)

## 2018-03-04 LAB — LACTIC ACID, PLASMA: LACTIC ACID, VENOUS: 0.9 mmol/L (ref 0.5–1.9)

## 2018-03-04 MED ORDER — ACETAMINOPHEN 10 MG/ML IV SOLN
1000.0000 mg | Freq: Once | INTRAVENOUS | Status: AC
  Administered 2018-03-04: 1000 mg via INTRAVENOUS
  Filled 2018-03-04: qty 100

## 2018-03-04 MED ORDER — SODIUM CHLORIDE 0.9% FLUSH: 10.0000 mL | INTRAVENOUS | Status: DC | PRN

## 2018-03-04 MED ORDER — ACETAMINOPHEN NICU IV SYRINGE 10 MG/ML: 1000.0000 mg | Freq: Three times a day (TID) | INTRAVENOUS | Status: DC

## 2018-03-04 MED ORDER — SODIUM CHLORIDE 0.9% FLUSH
10.0000 mL | Freq: Two times a day (BID) | INTRAVENOUS | Status: DC
  Administered 2018-03-04 – 2018-03-05 (×4): 10 mL
  Administered 2018-03-07: 3 mL

## 2018-03-04 MED ORDER — POTASSIUM CHLORIDE 10 MEQ/50ML IV SOLN
10.0000 meq | INTRAVENOUS | Status: AC
  Administered 2018-03-04 (×4): 10 meq via INTRAVENOUS
  Filled 2018-03-04: qty 50

## 2018-03-04 MED ORDER — ACETAMINOPHEN 10 MG/ML IV SOLN
1000.0000 mg | Freq: Three times a day (TID) | INTRAVENOUS | Status: AC
  Administered 2018-03-05 (×3): 1000 mg via INTRAVENOUS
  Filled 2018-03-04 (×4): qty 100

## 2018-03-04 MED ORDER — POTASSIUM CHLORIDE 10 MEQ/50ML IV SOLN
10.0000 meq | INTRAVENOUS | Status: DC
  Administered 2018-03-04 (×4): 10 meq via INTRAVENOUS
  Filled 2018-03-04 (×14): qty 50

## 2018-03-04 MED ORDER — SODIUM CHLORIDE 0.9% IV SOLUTION
Freq: Once | INTRAVENOUS | Status: AC
  Administered 2018-03-06: 03:00:00 via INTRAVENOUS

## 2018-03-04 MED ORDER — POTASSIUM PHOSPHATES 15 MMOLE/5ML IV SOLN
10.0000 mmol | Freq: Once | INTRAVENOUS | Status: AC
  Administered 2018-03-04: 10 mmol via INTRAVENOUS
  Filled 2018-03-04: qty 3.33

## 2018-03-04 NOTE — Progress Notes (Signed)
[facility] [place] [facility]   PATIENT NAME: Jaime [name]    MR#:  [id number]  DATE OF BIRTH:  1928-03-21  SUBJECTIVE: Since seen in ICU, off the BiPAP, appears slightly more comfortable with breathing but has back pain.  CHIEF COMPLAINT:   Chief Complaint  Patient presents with  . Back Pain  . Weakness  Awake and responds to verbal commands On oxygen via nasal cannula at 10 L Off BiPAP Complains of back pain REVIEW OF SYSTEMS:  ROS  CONSTITUTIONAL: No documented fever. No fatigue, weakness. No weight gain, no weight loss.  EYES: No blurry or double vision.  ENT: No tinnitus. No postnasal drip. No redness of the oropharynx.  RESPIRATORY: No cough, no wheeze, no hemoptysis. No dyspnea.  CARDIOVASCULAR: No chest pain. No orthopnea. No palpitations. No syncope.  GASTROINTESTINAL: No nausea, no vomiting or diarrhea. No abdominal pain. No melena or hematochezia.  GENITOURINARY: No dysuria or hematuria.  ENDOCRINE: No polyuria or nocturia. No heat or cold intolerance.  HEMATOLOGY: No anemia. No bruising. No bleeding.  INTEGUMENTARY: No rashes. No lesions.  MUSCULOSKELETAL: No arthritis. No swelling. No gout.  Has back pain NEUROLOGIC: No numbness, tingling, or ataxia. No seizure-type activity.  PSYCHIATRIC: No anxiety. No insomnia. No ADD.    DRUG ALLERGIES:   Allergies  Allergen Reactions  . Codeine Nausea And Vomiting    Pt states she can't take oxycodone.  She states she can take Percocet.  . Levofloxacin Other (See Comments)  . Oxycodone Nausea And Vomiting  . Prednisone     Patient states she had a stroke when she took prednisone before.  . Tramadol Hcl Anxiety    VITALS:  Blood pressure (!) 113/54, pulse 83, temperature 97.8 F (36.6 C), temperature source Oral, resp. rate (!) 25, height 5\' 2"  (1.575 m), weight 91.4 kg, SpO2 100 %. PHYSICAL EXAMINATION:  GENERAL:  84 y.o.-year-old patient lying in the bed on oxygen via nasal  cannula at high flow 10 L EYES: Pupils equal, round, reactive to light . No scleral icterus. Extraocular muscles intact.  HEENT: Head atraumatic, normocephalic. Oropharynx and nasopharynx clear.  NECK:  Supple, no jugular venous distention. No thyroid enlargement, no tenderness.  LUNGS: Bilateral coarse breath sounds present, basilar crepitus present. CARDIOVASCULAR: S1, S2 tachycardic. No murmurs, rubs, or gallops.  ABDOMEN: Soft, nontender, nondistended. Bowel sounds present. No organomegaly or mass.  EXTREMITIES: No pedal edema NEUROLOGIC: Nonfocal. Legally Blind PSYCHIATRIC: The patient is lethargic.  SKIN: No obvious rash, lesion, or ulcer. LABORATORY PANEL:   CBC Recent Labs  Lab 03/04/18 0324  WBC 14.4*  HGB 6.7*  HCT 21.4*  PLT 209   ------------------------------------------------------------------------------------------------------------------  Chemistries  Recent Labs  Lab 03/04/18 0211  NA 138  K 2.8*  CL 99  CO2 32  GLUCOSE 167*  BUN 38*  CREATININE 0.78  CALCIUM 7.2*  MG 2.2  AST 10*  ALT 11  ALKPHOS 60  BILITOT 0.7   ------------------------------------------------------------------------------------------------------------------  Cardiac Enzymes Recent Labs  Lab 03/04/18 0211  TROPONINI 0.03*   ------------------------------------------------------------------------------------------------------------------  RADIOLOGY:  Dg Chest Port 1 View  Result Date: 03/02/2018 CLINICAL DATA:  Patient with code. EXAM: PORTABLE CHEST 1 VIEW COMPARISON:  03/01/2018 FINDINGS: Pacer pad overlies the patient. Stable cardiomegaly. Diffuse bilateral interstitial pulmonary opacities. Small bilateral pleural effusions. Thoracic spine degenerative changes. IMPRESSION: Cardiomegaly with mild interstitial edema. Electronically Signed   By: Annia Belt M.D.   On: 03/02/2018 14:54    EKG:   Orders placed  or performed during the hospital encounter of 03/01/18  . ED  EKG  . ED EKG  . EKG 12-Lead  . EKG 12-Lead  . EKG 12-Lead  . EKG 12-Lead    ASSESSMENT AND PLAN:  84 year old female with past medical history of hypertension, previous history of spinal stenosis, CHF, compression fracture, osteoarthritis, chronic lymphedema of the lower extremities, chronic anemia who presents to the hospital due to back pain, difficulty ambulating.  *Acute hypoxic respiratory failure Status post CODE BLUE yesterday Continue oxygen via nasal cannula at high flow BiPAP as needed Critical care intensivist follow-up  *Acute hypokalemia Potassium to be replaced aggressively by intensivist  *MSSA bacteremia Continue IV cefazolin antibiotic Work-up with TEE as per intensivist Infectious disease follow-up  *Gram-negative UTI Continue IV cefazolin antibiotic  * chronic Back pain/difficulty ambulate- - Continue supportive care with pain meds as needed - Physical therapy consult to assess mobility.  Patient may need short-term rehab placement.  Consult social work.  * CHF- acute on chronic diastolic dysfunction: -continue IV Lasix 20 mg BID, follow I's and O's and daily weights.   -Continue Cardizem.hold Coreg  * COPD- no acute exacerbation.  Continue Dulera, albuterol inhaler as needed. - PRN duo nebs  * Essential hypertension-continue Cardizem. Hold coreg  * GERD-continue Protonix.  Patient has had recurrent hospitalizations for similar reasons over the past 6 months. Long-term prognosis poor    All the records are reviewed and case discussed with Care Management/Social Workerr. Management plans discussed with the patient, Cardio, nursing and they are in agreement.  CODE STATUS: Full code  TOTAL TIME TAKING CARE OF THIS PATIENT: 34 minutes.   More than 50% time spent in counseling, coordination of care     Ihor AustinPavan Guled Gahan M.D on 03/04/2018 at 12:02 PM  Between 7am to 6pm - Pager - [phone]  After 6pm go to [email or web address] - password [email or web address]  [facility]  [facility]  Office  [phone]  CC: Primary care physician; [name], Angela M, MD   Note: This dictation was prepared with Dragon dictation along with smaller phrase technology. Any transcriptional errors that result from this process are unintentional.

## 2018-03-04 NOTE — Plan of Care (Signed)
Patient, family and visitors have been thoroughly educated on the need for patient to be sitting all the way up when attempting to eat or drink to patient's high risk for aspiration. Patient and family verbally state that they understand but do not demonstrate these principles at the bedside when attempting to help the patient eat or drink. Patient has been encouraged all day that she needs to try and sit up for few minutes when eating or drinking to try and prevent reflux and aspiration but patient has refused, stating that her back hurts way too much. MD, Dr. Duanne LimerickSamaan made aware of the patient's concerns- ordered IV Tylenol as we were unable to give NSAIDS due to patient hemoglobin and patient's spouse reports adverse side effects when receiving opioid medications. Patient's intake has been minimal, extensive education and reinforcement has been provided to both patient and patient's family.

## 2018-03-04 NOTE — Progress Notes (Signed)
eLink Physician-Brief Progress Note Patient Name: Jaime Spittleatricia S Tardif DOB: Oct 05, 1927 MRN: [id number]   Date of Service  03/04/2018  HPI/Events of Note  Hgb drop  from 7.9 to 6.7  eICU Interventions  Transfuse 1 unit pRBC Post-transfusion CBC     Intervention Category Major Interventions: Other:  [name],ELIZABETH 03/04/2018, 5:04 AM

## 2018-03-04 NOTE — Progress Notes (Signed)
Husband observed at bedside attempting to feed patient with head of bed at 45 degrees. Spouse educated that patient need to be sitting further up to prevent aspiration. Patient was already chewing, HR dropped to the 30's patient, patient coughed and was able to clear her airway, heart rate came back up to the low 70's . Spouse stated that he understood educated provided and would not continue feeding patient if she was not sitting up as he could block her airway.

## 2018-03-04 NOTE — Progress Notes (Signed)
SLP Cancellation Note  Patient Details Name: Jaime [name] MRN: [id number] DOB: 1927-04-30   Cancelled treatment:       Reason Eval/Treat Not Completed: Patient declined, no reason specified  Pt continues to struggle to stay sitting up for intake. NSG consulted and was observed telling pt to remain sitting up for PO intake.    Meredith PelStacie Harris Sauber 03/04/2018, 10:52 AM

## 2018-03-04 NOTE — Progress Notes (Addendum)
Name: Jaime [name] MRN: [id number] DOB: 1928/01/07     CONSULTATION DATE: 03/01/2018  Subjective & objectives: Off dopamine.  Continues to require laying down flat because of significant back pain with sitting position and unable to start p.o. meds or diet  PAST MEDICAL HISTORY :   has a past medical history of Anemia, Anemia, Arthritis, Blind, Bronchitis, CHF (congestive heart failure) (HCC), Compression fracture, GERD (gastroesophageal reflux disease), H/O hiatal hernia, Herniated disc, Hypertension, Lymphedema of left leg, and MRSA (methicillin resistant staph aureus) culture positive.  has a past surgical history that includes C secton ; Knee arthroscopy; Breast enhancement surgery; Total knee arthroplasty (10/18/2011); and Craniotomy (N/A, 08/01/2014). Prior to Admission medications   Medication Sig Start Date End Date Taking? Authorizing Provider  carvedilol (COREG) 6.25 MG tablet Take 1 tablet (6.25 mg total) by mouth 2 (two) times daily with a meal. 02/22/18  Yes Delfino Lovett, MD  Cholecalciferol 1000 UNITS tablet Take 1,000 Units by mouth daily.   Yes [provider]  CIPRODEX OTIC suspension Place 1 drop into both ears daily as needed for pain. 09/08/17  Yes [provider]  diclofenac sodium (VOLTAREN) 1 % GEL Apply 2 g topically 4 (four) times daily. 08/04/17  Yes Bacigalupo, Marzella Schlein, MD  diltiazem (CARDIZEM CD) 240 MG 24 hr capsule Take 1 capsule (240 mg total) by mouth daily. 02/26/18 03/28/18 Yes Pyreddy, Vivien Rota, MD  fluticasone (FLONASE) 50 MCG/ACT nasal spray Place 1 spray into the nose daily as needed for allergies.  02/06/14 06/19/27 Yes [provider]  Fluticasone-Salmeterol (ADVAIR DISKUS) 100-50 MCG/DOSE AEPB Inhale 1 puff into the lungs 2 (two) times daily as needed.  09/07/17  Yes [provider]  furosemide (LASIX) 20 MG tablet Take 1 tablet (20 mg total) by mouth daily as needed for edema. 02/22/18 02/22/19 Yes Delfino Lovett, MD    ibuprofen (ADVIL,MOTRIN) 600 MG tablet Take 1 tablet (600 mg total) by mouth 4 (four) times daily. 02/25/18  Yes Pyreddy, Vivien Rota, MD  IRON PO Take 65 mg by mouth daily.   Yes [provider]  magnesium oxide (MAG-OX) 400 MG tablet Take 400 mg by mouth daily.   Yes [provider]  mupirocin ointment (BACTROBAN) 2 % Apply 1 application topically daily as needed.  02/13/18  Yes [provider]  MYRBETRIQ 25 MG TB24 tablet Take 1 tablet by mouth daily. 01/11/18  Yes [provider]  omeprazole (PRILOSEC) 20 MG capsule Take 1 capsule (20 mg total) by mouth daily. 08/23/14  Yes Angiulli, Mcarthur Rossetti, PA-C  polyvinyl alcohol (LIQUIFILM TEARS) 1.4 % ophthalmic solution Place 1 drop into both eyes as needed for dry eyes.   Yes [provider]  predniSONE (DELTASONE) 10 MG tablet Take 1 tablet (10 mg total) by mouth daily. 6 tabs PO x 1 day 5 tabs PO x 1 day 4 tabs PO x 1 day 3 tabs PO x 1 day 2 tabs PO x 1 day 1 tab PO x 1 day and stop 02/26/18  Yes Enid Baas, MD  risperiDONE (RISPERDAL) 0.25 MG tablet Take 1 tablet (0.25 mg total) by mouth at bedtime. 02/22/18  Yes Delfino Lovett, MD  VENTOLIN HFA 108 (90 BASE) MCG/ACT inhaler Inhale 2 puffs into the lungs every 4 (four) hours as needed for wheezing or shortness of breath.  05/07/14  Yes [provider]   Allergies  Allergen Reactions  . Codeine Nausea And Vomiting    Pt states she can't take oxycodone.  She states she can take Percocet.  . Levofloxacin Other (See Comments)  . Oxycodone Nausea And Vomiting  . Prednisone     Patient states she had a stroke when she took prednisone before.  . Tramadol Hcl Anxiety    FAMILY HISTORY:  family history includes Cancer in her father and sister; Healthy in her mother. SOCIAL HISTORY:  reports that she quit smoking about 30 years ago. Her smoking use included cigarettes. She has a 6.00 pack-year smoking history. She has never used smokeless tobacco.  She reports that she does not drink alcohol or use drugs.  REVIEW OF SYSTEMS:   Unable to obtain due to critical illness   VITAL SIGNS: Temp:  [97.4 F (36.3 C)-99.7 F (37.6 C)] 97.4 F (36.3 C) (11/23 1400) Pulse Rate:  [36-125] 59 (11/23 1400) Resp:  [15-30] 26 (11/23 1400) BP: (100-145)/(46-86) 111/50 (11/23 1400) SpO2:  [90 %-100 %] 100 % (11/23 1400)  Physical Examination:  Awake, oriented, legally blind and hard of hearing.  Moving all extremities with no focal motor deficits Tolerating [place], no distress, bilateral equal air entry and no adventitious sounds S1 & S2 are audible with no murmur Benign abdominal exam with feeble peristalsis Wasted extremities with bilateral leg edema  ASSESSMENT / PLAN: Acute (improved) on chronic respiratory failure with baseline home O2 as per H&P. Tolerating HFNC 15 L/min -Monitor ABG, optimize BiPAP settings, monitor work of breathing and consider intubation if no improvement  MSSA bacteremia On Cefazolin on BLD cult 03/02/2018.  Echocardiogram 03/03/2018 LVEF 60 to 65% and no vegetations. -Management as per ID  Bradycardia QRS complexes reported during the event of CODE BLUE.  No chest pain.  H/o HFpEF. Echocardiogram 02/22/2018 LVEF 60 to 65% with normal wall motion -Monitor electrolytes, EKG, cardiac enzymes. -Gentle diuresis  Pneumonia.  Bibasilar airspace disease and small left pleural effusion -Off Rocephin + Zithromax. -Gentle diuresis to improve lung compliance -Monitor CXR + CBC + FiO2, procalcitonin  AKI (improved) -Optimize hydration, monitor renal panel and urine out put  COPD -Optimize bronchodilators  UTI -Empiric Rocephin and follow urine culture  Anemia with HB drop of 1.2 gm within the last 24 h requiring transfusion -Keep hemoglobin more than 7 g/dL -Hold subcu of Lovenox  Chronic pains with degenerative joint disease status post multiple orthopedic surgeries -Optimize analgesia  Hypokalemia and  hypophosphatemia -replete and monitor electrolytes  Full code  DVT & GI prophylaxis.  Continue with supportive care Mr. Jaime [name] was updated at the bedside and he agrees with the plan of care  Critical care time 40 minutes
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
## 2018-03-04 NOTE — Progress Notes (Signed)
[facility]OC[facility] FOR AM LAB REPLACEMENT ONLY  The patient does apply for the [facility] Electrolyte [facility]plac[facility] Protocol based on the criteria listed below:   1. Is GFR >/= 40 ml/min? Yes.    Patient's GFR today is >60 2. Is urine output >/= 0.5 ml/kg/hr for the last 6 hours? Yes.   Patient's UOP is 0.7 ml/kg/hr 3. Is BUN < 60 mg/dL? Yes.    Patient's BUN today is 38 4. Abnormal electrolyte(s): 38 5. Ordered repletion with: per protocol2 6. If a panic level lab has been reported, has the CCM MD in charge been notified? Yes.  .   Physician:  Deterding,E. MD  Ethelle LyonStophel, Micki Rileybby Parker 03/04/2018 5:09 AM

## 2018-03-05 ENCOUNTER — Inpatient Hospital Stay: Payer: Medicare Other

## 2018-03-05 ENCOUNTER — Encounter: Payer: Self-pay | Admitting: Radiology

## 2018-03-05 LAB — GLUCOSE, CAPILLARY
GLUCOSE-CAPILLARY: 142 mg/dL — AB (ref 70–99)
Glucose-Capillary: 129 mg/dL — ABNORMAL HIGH (ref 70–99)

## 2018-03-05 LAB — CBC WITH DIFFERENTIAL/PLATELET
Abs Immature Granulocytes: 0.16 10*3/uL — ABNORMAL HIGH (ref 0.00–0.07)
Basophils Absolute: 0 10*3/uL (ref 0.0–0.1)
Basophils Relative: 0 %
Eosinophils Absolute: 0.6 10*3/uL — ABNORMAL HIGH (ref 0.0–0.5)
Eosinophils Relative: 5 %
HCT: 22.5 % — ABNORMAL LOW (ref 36.0–46.0)
HEMOGLOBIN: 7.4 g/dL — AB (ref 12.0–15.0)
Immature Granulocytes: 2 %
LYMPHS PCT: 7 %
Lymphs Abs: 0.8 10*3/uL (ref 0.7–4.0)
MCH: 32.3 pg (ref 26.0–34.0)
MCHC: 32.9 g/dL (ref 30.0–36.0)
MCV: 98.3 fL (ref 80.0–100.0)
MONO ABS: 0.4 10*3/uL (ref 0.1–1.0)
Monocytes Relative: 4 %
Neutro Abs: 9 10*3/uL — ABNORMAL HIGH (ref 1.7–7.7)
Neutrophils Relative %: 82 %
PLATELETS: 176 10*3/uL (ref 150–400)
RBC: 2.29 MIL/uL — AB (ref 3.87–5.11)
RDW: 25.6 % — ABNORMAL HIGH (ref 11.5–15.5)
WBC: 10.8 10*3/uL — AB (ref 4.0–10.5)
nRBC: 0 % (ref 0.0–0.2)

## 2018-03-05 LAB — URINE CULTURE

## 2018-03-05 LAB — CULTURE, BLOOD (ROUTINE X 2): Special Requests: ADEQUATE

## 2018-03-05 LAB — COMPREHENSIVE METABOLIC PANEL
ALT: 7 U/L (ref 0–44)
AST: 12 U/L — ABNORMAL LOW (ref 15–41)
Albumin: 2.1 g/dL — ABNORMAL LOW (ref 3.5–5.0)
Alkaline Phosphatase: 65 U/L (ref 38–126)
Anion gap: 7 (ref 5–15)
BUN: 26 mg/dL — ABNORMAL HIGH (ref 8–23)
CALCIUM: 7.5 mg/dL — AB (ref 8.9–10.3)
CO2: 31 mmol/L (ref 22–32)
CREATININE: 0.58 mg/dL (ref 0.44–1.00)
Chloride: 103 mmol/L (ref 98–111)
GFR calc non Af Amer: >60 mL/min (ref >60)
Glucose, Bld: 123 mg/dL — ABNORMAL HIGH (ref 70–99)
Potassium: 3.8 mmol/L (ref 3.5–5.1)
Sodium: 141 mmol/L (ref 135–145)
Total Bilirubin: 1.1 mg/dL (ref 0.3–1.2)
Total Protein: 5.3 g/dL — ABNORMAL LOW (ref 6.5–8.1)

## 2018-03-05 LAB — PHOSPHORUS: PHOSPHORUS: 1.8 mg/dL — AB (ref 2.5–4.6)

## 2018-03-05 LAB — HEMOGLOBIN AND HEMATOCRIT, BLOOD
HCT: 25.3 % — ABNORMAL LOW (ref 36.0–46.0)
HEMOGLOBIN: 7.9 g/dL — AB (ref 12.0–15.0)

## 2018-03-05 LAB — PROCALCITONIN: Procalcitonin: 1.52 ng/mL

## 2018-03-05 LAB — MAGNESIUM: Magnesium: 2 mg/dL (ref 1.7–2.4)

## 2018-03-05 LAB — PREPARE RBC (CROSSMATCH)

## 2018-03-05 MED ORDER — FENTANYL 25 MCG/HR TD PT72
25.0000 ug | MEDICATED_PATCH | TRANSDERMAL | Status: DC
  Administered 2018-03-05: 25 ug via TRANSDERMAL
  Filled 2018-03-05: qty 1

## 2018-03-05 MED ORDER — IOPAMIDOL (ISOVUE-300) INJECTION 61%
100.0000 mL | Freq: Once | INTRAVENOUS | Status: AC | PRN
  Administered 2018-03-05: 100 mL via INTRAVENOUS

## 2018-03-05 MED ORDER — FENTANYL CITRATE (PF) 100 MCG/2ML IJ SOLN
12.5000 ug | Freq: Once | INTRAMUSCULAR | Status: AC
  Administered 2018-03-05: 12.5 ug via INTRAVENOUS
  Filled 2018-03-05: qty 2

## 2018-03-05 MED ORDER — FLEET ENEMA 7-19 GM/118ML RE ENEM
1.0000 | ENEMA | Freq: Every day | RECTAL | Status: DC | PRN
  Filled 2018-03-05: qty 1

## 2018-03-05 NOTE — Progress Notes (Addendum)
[facility] [place] [facility]   PATIENT NAME: Jaime [name]    MR#:  [id number]  DATE OF BIRTH:  Aug 16, 1927  SUBJECTIVE: Since seen in ICU, off the BiPAP, appears slightly more comfortable with breathing but has back pain.  CHIEF COMPLAINT:   Chief Complaint  Patient presents with  . Back Pain  . Weakness  Awake and responds to verbal commands Has constipation Transferred to medical floor On oxygen via nasal cannula  Off BiPAP Complains of back pain REVIEW OF SYSTEMS:  ROS  CONSTITUTIONAL: No documented fever. No fatigue, weakness. No weight gain, no weight loss.  EYES: No blurry or double vision.  ENT: No tinnitus. No postnasal drip. No redness of the oropharynx.  RESPIRATORY: No cough, no wheeze, no hemoptysis. No dyspnea.  CARDIOVASCULAR: No chest pain. No orthopnea. No palpitations. No syncope.  GASTROINTESTINAL: No nausea, no vomiting or diarrhea. No abdominal pain. No melena or hematochezia.  GENITOURINARY: No dysuria or hematuria.  ENDOCRINE: No polyuria or nocturia. No heat or cold intolerance.  HEMATOLOGY: No anemia. No bruising. No bleeding.  INTEGUMENTARY: No rashes. No lesions.  MUSCULOSKELETAL: No arthritis. No swelling. No gout.  Has back pain NEUROLOGIC: No numbness, tingling, or ataxia. No seizure-type activity.  PSYCHIATRIC: No anxiety. No insomnia. No ADD.    DRUG ALLERGIES:   Allergies  Allergen Reactions  . Codeine Nausea And Vomiting    Pt states she can't take oxycodone.  She states she can take Percocet.  . Levofloxacin Other (See Comments)  . Oxycodone Nausea And Vomiting  . Prednisone     Patient states she had a stroke when she took prednisone before.  . Tramadol Hcl Anxiety    VITALS:  Blood pressure (!) 168/80, pulse 94, temperature 98.2 F (36.8 C), temperature source Oral, resp. rate (!) 26, height 5\' 2"  (1.575 m), weight 91.4 kg, SpO2 90 %. PHYSICAL EXAMINATION:  GENERAL:  84 y.o.-year-old  patient lying in the bed on oxygen via nasal cannula  EYES: Pupils equal, round, reactive to light . No scleral icterus. Extraocular muscles intact.  HEENT: Head atraumatic, normocephalic. Oropharynx and nasopharynx clear.  NECK:  Supple, no jugular venous distention. No thyroid enlargement, no tenderness.  LUNGS: Bilateral coarse breath sounds present, basilar crepitus present. CARDIOVASCULAR: S1, S2 tachycardic. No murmurs, rubs, or gallops.  ABDOMEN: Soft, nontender, nondistended. Bowel sounds present. No organomegaly or mass.  EXTREMITIES: No pedal edema NEUROLOGIC: Nonfocal. Legally Blind PSYCHIATRIC: The patient is lethargic.  SKIN: No obvious rash, lesion, or ulcer. LABORATORY PANEL:   CBC Recent Labs  Lab 03/05/18 0556  WBC 10.8*  HGB 7.4*  HCT 22.5*  PLT 176   ------------------------------------------------------------------------------------------------------------------  Chemistries  Recent Labs  Lab 03/05/18 0556  NA 141  K 3.8  CL 103  CO2 31  GLUCOSE 123*  BUN 26*  CREATININE 0.58  CALCIUM 7.5*  MG 2.0  AST 12*  ALT 7  ALKPHOS 65  BILITOT 1.1   ------------------------------------------------------------------------------------------------------------------  Cardiac Enzymes Recent Labs  Lab 03/04/18 0211  TROPONINI 0.03*   ------------------------------------------------------------------------------------------------------------------  RADIOLOGY:  Dg Chest Port 1 View  Result Date: 03/05/2018 CLINICAL DATA:  Shortness of breath, CHF, smoker EXAM: PORTABLE CHEST 1 VIEW COMPARISON:  03/02/2018 FINDINGS: Defibrillator pads have been removed. Calcified breast implants noted bilaterally. Stable cardiomegaly with similar diffuse interstitial edema pattern and basilar atelectasis. Left lower lobe dense collapse/consolidation evident. Difficult to exclude pneumonia. No enlarging effusion or pneumothorax. Trachea is midline. Aorta atherosclerotic.  Degenerative changes of the  spine and shoulders. IMPRESSION: Stable cardiomegaly with persistent edema pattern and left lower lobe collapse/consolidation. Electronically Signed   By: Judie PetitM.  Shick M.D.   On: 03/05/2018 09:15    EKG:   Orders placed or performed during the hospital encounter of 03/01/18  . ED EKG  . ED EKG  . EKG 12-Lead  . EKG 12-Lead  . EKG 12-Lead  . EKG 12-Lead    ASSESSMENT AND PLAN:  84 year old female with past medical history of hypertension, previous history of spinal stenosis, CHF, compression fracture, osteoarthritis, chronic lymphedema of the lower extremities, chronic anemia who presents to the hospital due to back pain, difficulty ambulating.  *Status post hypoxic respiratory failure Continue oxygen via nasal cannula  Transferred to medical floor BiPAP as needed Critical care intensivist follow-up appreciated  *Acute hypokalemia resolved  *MSSA bacteremia On IV cefazolin antibiotic Work-up with TEE , will follow-up with cardiology Infectious disease follow-up appreciated Imaging of spine after discussion with radiology to assess for any discitis  *ESBL E. coli UTI probably a contaminant Follow-up with ID  *Left lung pneumonia versus edema On IV cefazolin antibiotic  * chronic Back pain/difficulty ambulate- - Continue supportive care with pain meds as needed - Physical therapy consult to assess mobility.  Patient may need short-term rehab placement.  Consult social work.  * CHF- acute on chronic diastolic dysfunction: -continue IV Lasix 20 mg BID, follow I's and O's and daily weights.   -Continue Cardizem.hold Coreg  * COPD- no acute exacerbation.  Continue Dulera, albuterol inhaler as needed. - PRN duo nebs  * Essential hypertension-continue Cardizem. Hold coreg  * GERD-continue Protonix.  Patient has had recurrent hospitalizations for similar reasons over the past 6 months. Long-term prognosis poor    All the records are  reviewed and case discussed with Care Management/Social Workerr. Management plans discussed with the patient, Cardio, nursing and they are in agreement.  CODE STATUS: Full code  TOTAL TIME TAKING CARE OF THIS PATIENT: 36 minutes.   More than 50% time spent in counseling, coordination of care     Ihor AustinPavan Pyreddy M.D on 03/05/2018 at 12:25 PM  Between 7am to 6pm - Pager - [phone]  After 6pm go to [email or web address] - password [email or web address] [facility]  [facility]  Office  [phone]  CC: Primary care physician; [name], Angela M, MD   Note: This dictation was prepared with Dragon dictation along with smaller phrase technology. Any transcriptional errors that result from this process are unintentional.

## 2018-03-05 NOTE — Progress Notes (Signed)
Jaime [name] finally fell asleep and I inform x-ray to move the time to around 07:30am , this will enable the patient to have few hours of much needed sleep. Pt resting comfortable for the 1st time through out this shift.

## 2018-03-05 NOTE — Progress Notes (Signed)
[facility] on [facility]. [facility] to room 252 on O2.

## 2018-03-05 NOTE — Progress Notes (Signed)
Report given to Imma, RN.  No care concerns at this time.

## 2018-03-06 DIAGNOSIS — M549 Dorsalgia, unspecified: Secondary | ICD-10-CM

## 2018-03-06 LAB — CALCIUM, IONIZED: Calcium, Ionized, Serum: 4.5 mg/dL (ref 4.5–5.6)

## 2018-03-06 MED ORDER — KETOROLAC TROMETHAMINE 30 MG/ML IJ SOLN: 30.0000 mg | Freq: Four times a day (QID) | INTRAMUSCULAR | Status: DC | PRN

## 2018-03-06 MED ORDER — FENTANYL 25 MCG/HR TD PT72: 50.0000 ug | MEDICATED_PATCH | TRANSDERMAL | Status: DC

## 2018-03-06 MED ORDER — MORPHINE SULFATE (PF) 2 MG/ML IV SOLN: 2.0000 mg | INTRAVENOUS | Status: DC | PRN

## 2018-03-06 MED ORDER — MORPHINE SULFATE (PF) 2 MG/ML IV SOLN: 1.0000 mg | INTRAVENOUS | Status: DC | PRN

## 2018-03-06 MED ORDER — FENTANYL 50 MCG/HR TD PT72: 50.0000 ug | MEDICATED_PATCH | TRANSDERMAL | Status: DC

## 2018-03-06 MED ORDER — KETOROLAC TROMETHAMINE 15 MG/ML IJ SOLN
7.5000 mg | Freq: Four times a day (QID) | INTRAMUSCULAR | Status: AC | PRN
  Administered 2018-03-07 – 2018-03-11 (×3): 7.5 mg via INTRAVENOUS
  Filled 2018-03-06 (×3): qty 1

## 2018-03-06 MED ORDER — ACETAMINOPHEN 10 MG/ML IV SOLN
1000.0000 mg | Freq: Four times a day (QID) | INTRAVENOUS | Status: AC
  Administered 2018-03-06 – 2018-03-07 (×4): 1000 mg via INTRAVENOUS
  Filled 2018-03-06 (×4): qty 100

## 2018-03-06 NOTE — Progress Notes (Signed)
Patient ID: Jaime [name], female   DOB: 1927-07-09, 84 y.o.   MRN: [id number]  This NP visited patient at the bedside as a follow up to last weeks GOCs meeting and for palliative medicine needs and emotional support.    Patient's husband is at the bedside.    Patient herself  is weak and her shortness of breath weakens on minimal exertion. She is requiring 8 / liters nasal cannula to support SATs.    Dr Sherryll BurgerShah just left the room and updated patient and her husband on the seriousness of the medical situation and his concern for high risk of decompensation.  I again discussed the importance of documentation of advanced directives specific to desire for intubation in the event of respiratory decline/failure.    At this time the patient and her husband are open to all offered and available medical interventions to prolong life.   Discussed with patient the limitations of medical interventions to prolong quality of life when a physical body begins to fail to thrive.  Patient "finds it hard to believe" that there is not "something more" treat and return her to baseline.   Created space and opportunity for patient and her husband to explore thoughts and feelings regarding current medical situation.  Questions and concerns addressed   Discussed with Dr Sherryll BurgerShah  Total time spent on the unit was 45 minutes  Greater than 50% of the time was spent in counseling and coordination of care  Jaime CreedMary Larach NP  Palliative Medicine Team Team Phone # [phone] Pager [phone]

## 2018-03-06 NOTE — Progress Notes (Addendum)
Speech Language Pathology Treatment: Dysphagia  Patient Details Name: Jaime [name] MRN: [id number] DOB: [birth year] Today's Date: 03/06/2018 Time: 0830-0910 SLP Time Calculation (min) (ACUTE ONLY): 40 min  Assessment / Plan / Recommendation Clinical Impression  Pt seen for ongoing assessment of toleration of oral diet. Pt appeared to present with oropharyngeal phase swallowing dysfunction at BSE w/ dx'd pharyngeal phase dysphagia as per MBSS results in May 2019. See report. Pt was recommended to be on thickened liquids but she has been drinking/eating a regular diet at home; husband denies any swallowing problems at home. At BSE and f/u, [name] and pt were educated on the need to sit Upright for all oral intake d/t her risk for aspiration. Upon entering room, [name] was feeding pt Chocolate Pudding w/ pt lying FLAT on her back - reclined in bed. [name] was educated on the need (again) for FULL positioning upright for safe oral intake - he stated she was "doing fine". Also noted congested coughing Prior to, and after, entering the room. Noted pt's Respiratory effort was congested and somewhat increased in effort. SLP and NSG staff attempted to support pt more upright in bed to ensure pt's head was forward to eat/drink safely. Pt does have back discomfort but w/ care and time, she was able to sit upright w/ a towel roll behind head to position head forward w/out overt discomfort stated but then she requested to lie back down again despite also wanting po's - then she decline the po's.  Continued the Education w/ the pt and Husband on the need for Upright positioning for ALL oral intake d/t her risk for aspiration secondary to her baseline, dx'd oropharyngeal phase Dysphagia; diet consistency needs; aspiration precautions. Recommend Dysphagia level 2(MINCED foods) w/ NECTAR liquids via TSP and Cup; Pills given Crushed for safer swallowing. Pt will need positioning support and  feeding assistance at meals. Pt should NOT eat/drink unless sitting upright for safe oral intake. These are recommendations for Discharge as well for pt's safety. MD, NSG updated. MD to reconsult if any further needs while admitted.    HPI HPI: Pt is a 84 y.o. female with a known history of multiple medical issues including GERD, hiatal hernia, lymphadema, chronic diastolic CHF, chronic back pain, GERD, osteoarthritis, chronic anemia, hypertension, chronic lymphedema of bilateral external extremities, COPD, and blindness who presented to the hospital due to generalized weakness, difficulty walking and back pain.  Patient was recently discharged from the hospital 3 days ago and had acute on chronic back pain and was discharged on some oral prednisone and pain control.  She returned back to the emergency room yesterday due to difficulty walking and complaining of some back pain.  Patient was evaluated in the ER and then sent home and this morning she refused to get up and walk around due to back and neck pain and therefore the husband brought her back to the ER.  Patient was discharged home with physical therapy and home oxygen but now returns back due to the above symptoms.  In the ER patient was noted to be mildly hypoxic and given some IV Lasix.  The ER attempted to ambulate her but she barely could walk a few steps and complained of significant back pain and therefore hospitalist services were contacted for admission. A Code was called and pt transferred to CCU on day #2 of admission. Pt is currently awake, easily distracted, and requesting "water". Per MBSS in May 2019, pt exhibited moderate pharyngeal dysphagia c/b  a swallow delay resulting in laryngeal penetration/aspiration w/ liquids. Pt was determined to be at an increased risk of aspiration d/t swallow delay and decreased airway protection (see report). Pt was recommended to be on a Dysphagia diet w/ Thickened liquids at home post the [facility] in May 2019.  Pt is not following this diet at home; Husband denies pt has any difficulty swallowing at home.       SLP Plan  All goals met(education completed; Palliative Care following)       Recommendations  Diet recommendations: Dysphagia 2 (fine chop);Nectar-thick liquid Liquids provided via: Teaspoon;Cup;No straw Medication Administration: Crushed with puree(for safer swallowing) Supervision: Staff to assist with self feeding;Full supervision/cueing for compensatory strategies Compensations: Minimize environmental distractions;Slow rate;Small sips/bites;Lingual sweep for clearance of pocketing;Multiple dry swallows after each bite/sip;Follow solids with liquid Postural Changes and/or Swallow Maneuvers: Seated upright 90 degrees;Upright 30-60 min after meal                General recommendations: (Palliative Care consult; Dietician f/u) Oral Care Recommendations: Oral care BID;Staff/trained caregiver to provide oral care Follow up Recommendations: Skilled Nursing facility(TBD) SLP Visit Diagnosis: Dysphagia, oropharyngeal phase (R13.12) Plan: All goals met(education completed; Palliative Care following)       GO              Jaime Kenner, MS, CCC-SLP Jaime [name] 03/06/2018, 4:09 PM

## 2018-03-06 NOTE — Progress Notes (Signed)
[facility] [place] at [facility]   PATIENT NAME: Jaime [name]    MR#:  [id number]  DATE OF BIRTH:  02-26-1928  SUBJECTIVE: Since seen in ICU, off the BiPAP, appears slightly more comfortable with breathing but has back pain.  CHIEF COMPLAINT:   Chief Complaint  Patient presents with  . Back Pain  . Weakness  requesting pain meds. Husband at bedside REVIEW OF SYSTEMS:  ROS  CONSTITUTIONAL: No documented fever. No fatigue, weakness. No weight gain, no weight loss.  EYES: No blurry or double vision.  ENT: No tinnitus. No postnasal drip. No redness of the oropharynx.  RESPIRATORY: No cough, no wheeze, no hemoptysis. No dyspnea.  CARDIOVASCULAR: No chest pain. No orthopnea. No palpitations. No syncope.  GASTROINTESTINAL: No nausea, no vomiting or diarrhea. No abdominal pain. No melena or hematochezia.  GENITOURINARY: No dysuria or hematuria.  ENDOCRINE: No polyuria or nocturia. No heat or cold intolerance.  HEMATOLOGY: No anemia. No bruising. No bleeding.  INTEGUMENTARY: No rashes. No lesions.  MUSCULOSKELETAL: No arthritis. No swelling. No gout.  Has back pain NEUROLOGIC: No numbness, tingling, or ataxia. No seizure-type activity.  PSYCHIATRIC: No anxiety. No insomnia. No ADD.   DRUG ALLERGIES:   Allergies  Allergen Reactions  . Codeine Nausea And Vomiting    Pt states she can't take oxycodone.  She states she can take Percocet.  . Levofloxacin Other (See Comments)  . Oxycodone Nausea And Vomiting  . Prednisone     Patient states she had a stroke when she took prednisone before.  . Tramadol Hcl Anxiety    VITALS:  Blood pressure (!) 150/62, pulse 63, temperature 97.6 F (36.4 C), temperature source Oral, resp. rate 18, height 5\' 2"  (1.575 m), weight 92 kg, SpO2 (!) 89 %. PHYSICAL EXAMINATION:  GENERAL:  84 y.o.-year-old patient lying in the bed on oxygen via nasal cannula  EYES: Pupils equal, round, reactive to light . No scleral  icterus. Extraocular muscles intact.  HEENT: Head atraumatic, normocephalic. Oropharynx and nasopharynx clear.  NECK:  Supple, no jugular venous distention. No thyroid enlargement, no tenderness.  LUNGS: Bilateral coarse breath sounds present, basilar crepitus present. CARDIOVASCULAR: S1, S2 normal. No murmurs, rubs, or gallops.  ABDOMEN: Soft, nontender, nondistended. Bowel sounds present. No organomegaly or mass.  EXTREMITIES: No pedal edema NEUROLOGIC: Nonfocal. Legally Blind PSYCHIATRIC: The patient is alert SKIN: No obvious rash, lesion, or ulcer. LABORATORY PANEL:   CBC Recent Labs  Lab 03/05/18 0556  WBC 10.8*  HGB 7.4*  HCT 22.5*  PLT 176   ------------------------------------------------------------------------------------------------------------------  Chemistries  Recent Labs  Lab 03/05/18 0556  NA 141  K 3.8  CL 103  CO2 31  GLUCOSE 123*  BUN 26*  CREATININE 0.58  CALCIUM 7.5*  MG 2.0  AST 12*  ALT 7  ALKPHOS 65  BILITOT 1.1   ------------------------------------------------------------------------------------------------------------------  Cardiac Enzymes Recent Labs  Lab 03/04/18 0211  TROPONINI 0.03*   ------------------------------------------------------------------------------------------------------------------  RADIOLOGY:  Ct Thoracic Spine W Contrast  Result Date: 03/06/2018 CLINICAL DATA:  Bacteremia, back pain, rule out diskitis EXAM: CT THORACIC SPINE WITH CONTRAST TECHNIQUE: Multidetector CT images of thoracic was performed according to the standard protocol following intravenous contrast administration. Concurrent CT lumbar spine performed, reported separately. CONTRAST:  ISOVUE-300 IOPAMIDOL (ISOVUE-300) INJECTION 61% COMPARISON:  02/17/2018 and previous FINDINGS: Alignment: Normal Vertebrae: Stable mild superior endplate compression deformity of T3. Stable mild central superior endplate compression deformities and Schmorl's  nodes involving T10 and T12. Demineralization and destructive change  involving the inferior endplate of T12 and adjacent aspect of the T12 vertebral body, new since 02/17/2018. No involvement of posterior cortex or posterior elements. Mild facet DJD  in the mid and lower thoracic spine T5-  T12. Paraspinal and other soft tissues: Paraspinal phlegmon to the right and left of the T12-L1 interspace. Moderate bilateral pleural effusions. Consolidation/atelectasis posteriorly in the visualized lungs. Calcified granuloma granulomas medially in the right upper lobe. Calcified precarinal lymph nodes. Coronary and aortic calcifications. Moderate hiatal hernia. Disc levels: Narrowing of the C5-6 interspace with posterior endplate spurring. Widening of the T12-L1 interspace probably related to the adjacent destructive changes in the T12 vertebral body. No definite epidural process.  No areas of unexpected enhancement. IMPRESSION: 1. Probable discitis/osteomyelitis T12-L1 with significant progressive destruction of the inferior endplate of T12 since 02/17/2018. Electronically Signed   By: Corlis Leak M.D.   On: 03/06/2018 08:47   Ct Lumbar Spine W Contrast  Result Date: 03/06/2018 CLINICAL DATA:  Low back pain, bacteremia, rule out diskitis EXAM: CT LUMBAR SPINE WITH CONTRAST TECHNIQUE: Multidetector CT imaging of the lumbar spine was performed with intravenous contrast administration. Concurrent CT thoracic spine obtained, reported separately. CONTRAST:  ISOVUE-300 IOPAMIDOL (ISOVUE-300) INJECTION 61% COMPARISON:  CT 11 202019 and previous FINDINGS: Segmentation: 5 non rib-bearing lumbar segments, assigned L1-L5. Alignment: 1-2 mm retrolisthesis L1-2.  Otherwise normal Vertebrae: Stable mild central superior endplate compression deformity and Schmorl's node involving T12. Demineralization and destructive change involving the inferior endplate of T12 and adjacent aspect of the T12 vertebral body, progressive since  03/01/2018. No involvement of posterior cortex or posterior elements. Chronic L4 compression deformity, stable since 08/04/2014 Paraspinal and other soft tissues: There is possible enhancement and associated small calcific densities in the anterior left epidural space behind L1, contiguous with left paraspinal and psoas disease. Paraspinal phlegmon to the right and left of the T12-L1 interspace. Contiguous multilocular left psoas abscess containing dependent high density debris, new since previous. Bilateral pleural effusions.  Aortic Atherosclerosis (ICD10-170.0). Disc levels: T12-L1: Mild widening of the interspace probably related destructive process involving T12. L1-2: Moderate narrowing of the interspace with vacuum phenomenon. Anterior posterior endplate spurring. L2-3: Mild narrowing of the interspace with vacuum phenomenon and small anterior endplate spurs. Facet DJD at all levels in the lumbar spine. IMPRESSION: 1. Probable discitis/osteomyelitis T12-L1 with significant progressive destruction of the inferior endplate of T12 since 02/17/2018. 2. Possible left anterior epidural abscess at the L1 level, with presumed contiguous extension into left paraspinal phlegmon and left psoas abscess. These results will be called to the ordering clinician or representative by the Radiologist Assistant, and communication documented in the PACS or zVision Dashboard. 3. Stable chronic L4 compression deformity. Electronically Signed   By: Corlis Leak M.D.   On: 03/06/2018 08:59   Dg Chest Port 1 View  Result Date: 03/05/2018 CLINICAL DATA:  Shortness of breath, CHF, smoker EXAM: PORTABLE CHEST 1 VIEW COMPARISON:  03/02/2018 FINDINGS: Defibrillator pads have been removed. Calcified breast implants noted bilaterally. Stable cardiomegaly with similar diffuse interstitial edema pattern and basilar atelectasis. Left lower lobe dense collapse/consolidation evident. Difficult to exclude pneumonia. No enlarging effusion or  pneumothorax. Trachea is midline. Aorta atherosclerotic. Degenerative changes of the spine and shoulders. IMPRESSION: Stable cardiomegaly with persistent edema pattern and left lower lobe collapse/consolidation. Electronically Signed   By: Judie Petit.  Shick M.D.   On: 03/05/2018 09:15    EKG:   Orders placed or performed during the hospital encounter of 03/01/18  . ED EKG  .  ED EKG  . EKG 12-Lead  . EKG 12-Lead  . EKG 12-Lead  . EKG 12-Lead    ASSESSMENT AND PLAN:  84 year old female with past medical history of hypertension, previous history of spinal stenosis, CHF, compression fracture, osteoarthritis, chronic lymphedema of the lower extremities, chronic anemia who presents to the hospital due to back pain, difficulty ambulating.  *Acute hypoxic respiratory failure: improved and off BiPAP Continue oxygen via nasal cannula   * Vertebral Osteomyelitis/Discitis/Epidural Abscess? - will need 6 weeks of IV Abx, d/w Ortho Dr Ernest PineHooten - ID following - Neurosurgery recommends conservative mgmt   * Acute on Chronic back pain - Will try tylenolol (mild), toeradol (moderate) and morphine for severe pain as need  *MSSA bacteremia On IV cefazolin antibiotic per ID Work-up with TEE , will follow-up with cardiology Infectious disease following  *ESBL E. coli UTI probably a contaminant per ID  *Left lung pneumonia versus edema On IV cefazolin antibiotic  * CHF- acute on chronic diastolic dysfunction: - follow I's and O's and daily weights.   -Continue Cardizem.hold Coreg & lasix  * COPD- no acute exacerbation.  Continue Dulera, albuterol inhaler as needed. - PRN duo nebs  * Essential hypertension-continue Cardizem. Hold coreg  * GERD-continue Protonix.  Patient has had recurrent hospitalizations for similar reasons over the past 6 months. Long-term prognosis poor  Patient or Husband not realistic - every time I've tried to discuss goals of care - they've turned it down and requests  transfer out to tertiary care  Palliative care following as well  She is at high risk for Code Blue, cardio-resp arrest, multi-organ failure and death  Please note patient is  hard of hearing and blind. This patient will be long stay here.  All the records are reviewed and case discussed with Care Management/Social Workerr. Management plans discussed with the patient, Cardio, nursing and they are in agreement.  CODE STATUS: Full code  TOTAL TIME (Critical care) TAKING CARE OF THIS PATIENT: 36 minutes.   More than 50% time spent in counseling, coordination of care     Delfino LovettVipul Bitania Shankland M.D on 03/06/2018 at 5:40 PM  Between 7am to 6pm - Pager - [phone]  After 6pm go to [email or web address] - password [email or web address] [facility]  [facility]  Office  [phone]  CC: Primary care physician; [name], Angela M, MD   Note: This dictation was prepared with Dragon dictation along with smaller phrase technology. Any transcriptional errors that result from this process are unintentional.
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
## 2018-03-06 NOTE — Progress Notes (Signed)
Please note, patient has a PENDING outpatient PALLIATIVE referral from last admission. CMRN Jennifer Miller made aware. °Karen Robertson RN, BSN, CHPN °[facility] [place], hospital Liaison °[phone] °

## 2018-03-06 NOTE — Telephone Encounter (Signed)
Noted, will keep a watch for her d/c.  -MM

## 2018-03-07 ENCOUNTER — Inpatient Hospital Stay: Payer: Medicare Other

## 2018-03-07 ENCOUNTER — Inpatient Hospital Stay: Payer: Self-pay

## 2018-03-07 DIAGNOSIS — M4625 Osteomyelitis of vertebra, thoracolumbar region: Secondary | ICD-10-CM

## 2018-03-07 DIAGNOSIS — R8271 Bacteriuria: Secondary | ICD-10-CM

## 2018-03-07 DIAGNOSIS — M4645 Discitis, unspecified, thoracolumbar region: Secondary | ICD-10-CM

## 2018-03-07 DIAGNOSIS — J441 Chronic obstructive pulmonary disease with (acute) exacerbation: Secondary | ICD-10-CM

## 2018-03-07 DIAGNOSIS — Z95828 Presence of other vascular implants and grafts: Secondary | ICD-10-CM

## 2018-03-07 DIAGNOSIS — R0603 Acute respiratory distress: Secondary | ICD-10-CM

## 2018-03-07 LAB — BASIC METABOLIC PANEL
ANION GAP: 8 (ref 5–15)
BUN: 17 mg/dL (ref 8–23)
CHLORIDE: 103 mmol/L (ref 98–111)
CO2: 29 mmol/L (ref 22–32)
Calcium: 8.1 mg/dL — ABNORMAL LOW (ref 8.9–10.3)
Creatinine, Ser: 0.66 mg/dL (ref 0.44–1.00)
GFR calc Af Amer: >60 mL/min (ref >60)
GFR calc non Af Amer: >60 mL/min (ref >60)
Glucose, Bld: 119 mg/dL — ABNORMAL HIGH (ref 70–99)
POTASSIUM: 4.3 mmol/L (ref 3.5–5.1)
SODIUM: 140 mmol/L (ref 135–145)

## 2018-03-07 LAB — CBC
HCT: 24.7 % — ABNORMAL LOW (ref 36.0–46.0)
HEMOGLOBIN: 7.7 g/dL — AB (ref 12.0–15.0)
MCH: 31.8 pg (ref 26.0–34.0)
MCHC: 31.2 g/dL (ref 30.0–36.0)
MCV: 102.1 fL — ABNORMAL HIGH (ref 80.0–100.0)
NRBC: 0 % (ref 0.0–0.2)
Platelets: 260 10*3/uL (ref 150–400)
RBC: 2.42 MIL/uL — AB (ref 3.87–5.11)
RDW: 24.9 % — ABNORMAL HIGH (ref 11.5–15.5)
WBC: 13.2 10*3/uL — AB (ref 4.0–10.5)

## 2018-03-07 LAB — GLUCOSE, CAPILLARY: GLUCOSE-CAPILLARY: 109 mg/dL — AB (ref 70–99)

## 2018-03-07 LAB — CULTURE, BLOOD (ROUTINE X 2)
Culture: NO GROWTH
SPECIAL REQUESTS: ADEQUATE

## 2018-03-07 MED ORDER — SODIUM CHLORIDE 0.9% FLUSH
3.0000 mL | Freq: Two times a day (BID) | INTRAVENOUS | Status: DC
  Administered 2018-03-07: 3 mL via INTRAVENOUS

## 2018-03-07 MED ORDER — SODIUM CHLORIDE 0.9% FLUSH: 10.0000 mL | INTRAVENOUS | Status: DC | PRN

## 2018-03-07 MED ORDER — SODIUM CHLORIDE 0.9% FLUSH
10.0000 mL | Freq: Two times a day (BID) | INTRAVENOUS | Status: DC
  Administered 2018-03-07 – 2018-03-13 (×10): 10 mL

## 2018-03-07 MED ORDER — SODIUM CHLORIDE 0.9% FLUSH: 3.0000 mL | INTRAVENOUS | Status: DC | PRN

## 2018-03-07 MED ORDER — LORAZEPAM 2 MG/ML IJ SOLN
0.5000 mg | Freq: Once | INTRAMUSCULAR | Status: AC
  Administered 2018-03-07: 0.5 mg via INTRAVENOUS
  Filled 2018-03-07: qty 1

## 2018-03-07 NOTE — Treatment Plan (Signed)
Diagnosis: MSSA bacteremia and discitis Baseline Creatinine < 1  Culture Result: MSSA  Allergies  Allergen Reactions  . Codeine Nausea And Vomiting    Pt states she can't take oxycodone.  She states she can take Percocet.  . Levofloxacin Other (See Comments)  . Oxycodone Nausea And Vomiting  . Prednisone     Patient states she had a stroke when she took prednisone before.  . Tramadol Hcl Anxiety    OPAT Orders Discharge antibiotics: Cefazolin 2 grams IV every 8 hours until 04/15/18 End Date:04/15/18  [facility] Care Per Protocol:  Labs weekly while on IV antibiotics: _x_ CBC with differential _x_ CMP _x_ CRP _x_ ESR   _x_ Please pull PIC at completion of IV antibiotics   Fax weekly labs to [phone]  Clinic Follow Up Appt:4 weeks   Call [phone] to make appt

## 2018-03-07 NOTE — Progress Notes (Signed)
Talked to Dr. Caryn BeeMaier about patient's oxygen sats desating, fluctuating between 80's-90's, also is tachypneic at 30 patient in venturi mask initially and was sating 94% but patient feels she was smothering and unable to keep it on. MD order patient to use Bipap tonight and to give 0.5 IV ativan. Patient on Bipap now at 96%. RN will continue to monitor.

## 2018-03-07 NOTE — Care Management Important Message (Signed)
Important Message  Patient Details  Name: Shaune Spittleatricia S Lesperance MRN: [id number] Date of Birth: 11/03/1927   Medicare Important Message Given:  Yes    Allayne ButcherJeanna M Jarelyn Bambach, RN 03/07/2018, 3:40 PM

## 2018-03-07 NOTE — Progress Notes (Addendum)
Date of Admission:  03/01/2018    cefazolin    Subjective: Says she still has severe back pain Also cough and wheezing  Medications:  . cholecalciferol  1,000 Units Oral Daily  . diclofenac sodium  2 g Topical QID  . diltiazem  240 mg Oral Daily  . magnesium oxide  400 mg Oral Daily  . mouth rinse  15 mL Mouth Rinse BID  . mirabegron ER  25 mg Oral Daily  . mometasone-formoterol  2 puff Inhalation BID  . pantoprazole  40 mg Oral Daily  . risperiDONE  0.25 mg Oral QHS  . sodium chloride flush  10-40 mL Intracatheter Q12H    Objective: Vital signs in last 24 hours: Temp:  [97.4 F (36.3 C)-98.1 F (36.7 C)] 97.7 F (36.5 C) (11/26 2004) Pulse Rate:  [60-69] 67 (11/26 2004) Resp:  [18] 18 (11/26 2004) BP: (139-148)/(59-70) 148/70 (11/26 2004) SpO2:  [85 %-99 %] 99 % (11/26 2004)  PHYSICAL EXAM:  General: awake, legally blind, responds to questions  Head: Normocephalic, without obvious abnormality, atraumatic. Eyes: blind ENT Nares normal. No drainage or sinus tenderness. Tongue coated, dry mouth Neck: Supple, symmetrical, no adenopathy, thyroid: non tender no carotid bruit and no JVD. Back: did not examine Lungs: b/l rhonchi, crepts Heart: s1s2 Abdomen: Soft, non-tender,not distended. Bowel sounds normal. No masses Extremities: atraumatic, no cyanosis. No edema. No clubbing Skin: bruising, rt arm PICC Lymph: Cervical, supraclavicular normal. Neurologic: Grossly non-focal  Lab Results Recent Labs    03/05/18 0556 03/07/18 0609 03/07/18 0746  WBC 10.8*  --  13.2*  HGB 7.4*  --  7.7*  HCT 22.5*  --  24.7*  NA 141 140  --   K 3.8 4.3  --   CL 103 103  --   CO2 31 29  --   BUN 26* 17  --   CREATININE 0.58 0.66  --    Liver Panel Recent Labs    03/05/18 0556  PROT 5.3*  ALBUMIN 2.1*  AST 12*  ALT 7  ALKPHOS 65  BILITOT 1.1  Microbiology:  Studies/Results: Dg Chest Port 1 View  Result Date: 03/07/2018 CLINICAL DATA:  PICC placement EXAM:  PORTABLE CHEST 1 VIEW COMPARISON:  03/05/2018 FINDINGS: Right arm PICC tip enters the SVC with the tip not well visualized due to patient size. Cardiac enlargement and pulmonary vascular congestion and mild edema unchanged. Small bilateral effusions. Left lower lobe atelectasis has progressed. IMPRESSION: Right arm PICC tip not well visualized. Catheter is seen extending into the SVC Congestive heart failure with mild edema and bilateral effusions unchanged. Progressive left lower lobe atelectasis. Electronically Signed   By: Marlan Palau M.D.   On: 03/07/2018 19:19   [place] Ekg Site Rite  Result Date: 03/07/2018 If Site Rite image not attached, placement could not be confirmed due to current cardiac rhythm.    Assessment/Plan:  MSSA bacteremia with Probable discitis/osteomyelitis T12-L1 with significant progressive destruction of the inferior endplate of T12 since 02/17/2018. 2. Possible left anterior epidural abscess at the L1 level, with presumed contiguous extension into left paraspinal phlegmon and left psoas abscess as evidence don the CT scan done on 03/05/18  She is going to need 6 -8 weeks of Iv cefazolin TEE is not required as it will not change the management and also she has tenuous pulmonary status Repeat blood culture -11/22 BC Ng so far  AKI- resolved could be from NSAID/infection-  respiratory distress :COPD- exacerbation- fluid overload and also likely  aspiration induced pneumonitis  due to her lying flat and eating  Severe lumbar pain will need brace as recommended by neurosurgeon   ESBL e.coli in the urine- likely contamination VS colonization- asymptomatic- UA has no WBC- will not treat. Will get post void bladder scan  H/o B/l TKA- no evidence of infection  Discussed the management with patient, her husband and daughter

## 2018-03-07 NOTE — Progress Notes (Signed)
[facility] [place] at [facility]   PATIENT NAME: Jaime [name]    MR#:  [id number]  DATE OF BIRTH:  [birth year]  SUBJECTIVE: Since seen in ICU, off the BiPAP, appears slightly more comfortable with breathing but has back pain.  CHIEF COMPLAINT:   Chief Complaint  Patient presents with  . Back Pain  . Weakness  Complains of back pain, wants to eat something, feels short of breath. keeps asking if she is any better.  Husband at bedside REVIEW OF SYSTEMS:  ROS  CONSTITUTIONAL: No documented fever. No fatigue, weakness. No weight gain, no weight loss.  EYES: No blurry or double vision.  ENT: No tinnitus. No postnasal drip. No redness of the oropharynx.  RESPIRATORY: No cough, no wheeze, no hemoptysis. No dyspnea.  CARDIOVASCULAR: No chest pain. No orthopnea. No palpitations. No syncope.  GASTROINTESTINAL: No nausea, no vomiting or diarrhea. No abdominal pain. No melena or hematochezia.  GENITOURINARY: No dysuria or hematuria.  ENDOCRINE: No polyuria or nocturia. No heat or cold intolerance.  HEMATOLOGY: No anemia. No bruising. No bleeding.  INTEGUMENTARY: No rashes. No lesions.  MUSCULOSKELETAL: No arthritis. No swelling. No gout. back pain + NEUROLOGIC: No numbness, tingling, or ataxia. No seizure-type activity.  PSYCHIATRIC: No anxiety. No insomnia. No ADD.  DRUG ALLERGIES:   Allergies  Allergen Reactions  . Codeine Nausea And Vomiting    Pt states she can't take oxycodone.  She states she can take Percocet.  . Levofloxacin Other (See Comments)  . Oxycodone Nausea And Vomiting  . Prednisone     Patient states she had a stroke when she took prednisone before.  . Tramadol Hcl Anxiety    VITALS:  Blood pressure (!) 144/62, pulse 60, temperature (!) 97.5 F (36.4 C), resp. rate 18, height 5\' 2"  (1.575 m), weight 92 kg, SpO2 96 %. PHYSICAL EXAMINATION:  GENERAL:  84 y.o.-year-old patient lying in the bed on oxygen via nasal cannula  EYES:  Pupils equal, round, reactive to light . No scleral icterus. Extraocular muscles intact.  HEENT: Head atraumatic, normocephalic. Oropharynx and nasopharynx clear.  NECK:  Supple, no jugular venous distention. No thyroid enlargement, no tenderness.  LUNGS: Bilateral coarse breath sounds present, basilar crepitus present. CARDIOVASCULAR: S1, S2 normal. No murmurs, rubs, or gallops.  ABDOMEN: Soft, nontender, nondistended. Bowel sounds present. No organomegaly or mass.  EXTREMITIES: No pedal edema NEUROLOGIC: Nonfocal. Legally Blind PSYCHIATRIC: The patient is alert SKIN: No obvious rash, lesion, or ulcer. LABORATORY PANEL:   CBC Recent Labs  Lab 03/07/18 0746  WBC 13.2*  HGB 7.7*  HCT 24.7*  PLT 260   ------------------------------------------------------------------------------------------------------------------  Chemistries  Recent Labs  Lab 03/05/18 0556 03/07/18 0609  NA 141 140  K 3.8 4.3  CL 103 103  CO2 31 29  GLUCOSE 123* 119*  BUN 26* 17  CREATININE 0.58 0.66  CALCIUM 7.5* 8.1*  MG 2.0  --   AST 12*  --   ALT 7  --   ALKPHOS 65  --   BILITOT 1.1  --    ------------------------------------------------------------------------------------------------------------------  Cardiac Enzymes Recent Labs  Lab 03/04/18 0211  TROPONINI 0.03*   ------------------------------------------------------------------------------------------------------------------  RADIOLOGY:  Ct Thoracic Spine W Contrast  Result Date: 03/06/2018 CLINICAL DATA:  Bacteremia, back pain, rule out diskitis EXAM: CT THORACIC SPINE WITH CONTRAST TECHNIQUE: Multidetector CT images of thoracic was performed according to the standard protocol following intravenous contrast administration. Concurrent CT lumbar spine performed, reported separately. CONTRAST:  100mL ISOVUE-300 IOPAMIDOL (ISOVUE-300) INJECTION  61% COMPARISON:  02/17/2018 and previous FINDINGS: Alignment: Normal Vertebrae: Stable mild  superior endplate compression deformity of T3. Stable mild central superior endplate compression deformities and Schmorl's nodes involving T10 and T12. Demineralization and destructive change involving the inferior endplate of T12 and adjacent aspect of the T12 vertebral body, new since 02/17/2018. No involvement of posterior cortex or posterior elements. Mild facet DJD  in the mid and lower thoracic spine T5-  T12. Paraspinal and other soft tissues: Paraspinal phlegmon to the right and left of the T12-L1 interspace. Moderate bilateral pleural effusions. Consolidation/atelectasis posteriorly in the visualized lungs. Calcified granuloma granulomas medially in the right upper lobe. Calcified precarinal lymph nodes. Coronary and aortic calcifications. Moderate hiatal hernia. Disc levels: Narrowing of the C5-6 interspace with posterior endplate spurring. Widening of the T12-L1 interspace probably related to the adjacent destructive changes in the T12 vertebral body. No definite epidural process.  No areas of unexpected enhancement. IMPRESSION: 1. Probable discitis/osteomyelitis T12-L1 with significant progressive destruction of the inferior endplate of T12 since 02/17/2018. Electronically Signed   By: Corlis Leak M.D.   On: 03/06/2018 08:47   Ct Lumbar Spine W Contrast  Result Date: 03/06/2018 CLINICAL DATA:  Low back pain, bacteremia, rule out diskitis EXAM: CT LUMBAR SPINE WITH CONTRAST TECHNIQUE: Multidetector CT imaging of the lumbar spine was performed with intravenous contrast administration. Concurrent CT thoracic spine obtained, reported separately. CONTRAST:  ISOVUE-300 IOPAMIDOL (ISOVUE-300) INJECTION 61% COMPARISON:  CT 11 202019 and previous FINDINGS: Segmentation: 5 non rib-bearing lumbar segments, assigned L1-L5. Alignment: 1-2 mm retrolisthesis L1-2.  Otherwise normal Vertebrae: Stable mild central superior endplate compression deformity and Schmorl's node involving T12. Demineralization and  destructive change involving the inferior endplate of T12 and adjacent aspect of the T12 vertebral body, progressive since 03/01/2018. No involvement of posterior cortex or posterior elements. Chronic L4 compression deformity, stable since 08/04/2014 Paraspinal and other soft tissues: There is possible enhancement and associated small calcific densities in the anterior left epidural space behind L1, contiguous with left paraspinal and psoas disease. Paraspinal phlegmon to the right and left of the T12-L1 interspace. Contiguous multilocular left psoas abscess containing dependent high density debris, new since previous. Bilateral pleural effusions.  Aortic Atherosclerosis (ICD10-170.0). Disc levels: T12-L1: Mild widening of the interspace probably related destructive process involving T12. L1-2: Moderate narrowing of the interspace with vacuum phenomenon. Anterior posterior endplate spurring. L2-3: Mild narrowing of the interspace with vacuum phenomenon and small anterior endplate spurs. Facet DJD at all levels in the lumbar spine. IMPRESSION: 1. Probable discitis/osteomyelitis T12-L1 with significant progressive destruction of the inferior endplate of T12 since 02/17/2018. 2. Possible left anterior epidural abscess at the L1 level, with presumed contiguous extension into left paraspinal phlegmon and left psoas abscess. These results will be called to the ordering clinician or representative by the Radiologist Assistant, and communication documented in the PACS or zVision Dashboard. 3. Stable chronic L4 compression deformity. Electronically Signed   By: Corlis Leak M.D.   On: 03/06/2018 08:59    EKG:   Orders placed or performed during the hospital encounter of 03/01/18  . ED EKG  . ED EKG  . EKG 12-Lead  . EKG 12-Lead  . EKG 12-Lead  . EKG 12-Lead    ASSESSMENT AND PLAN:  84 year old female with past medical history of hypertension, previous history of spinal stenosis, CHF, compression fracture,  osteoarthritis, chronic lymphedema of the lower extremities, chronic anemia who presents to the hospital due to back pain, difficulty ambulating.  *Acute  hypoxic respiratory failure: improved and off BiPAP Continue oxygen via nasal cannula -needing 5 L oxygen via nasal cannula -Try to wean oxygen  * Vertebral Osteomyelitis/Discitis/Epidural Abscess? - will need 6 weeks of IV Abx, d/w Ortho Dr Ernest Pine - ID following - Neurosurgery recommends conservative mgmt -back brace  * Acute on Chronic back pain - Will try tylenolol (mild), toeradol (moderate) and morphine for severe pain as need -Avoid narcotics if possible  *MSSA bacteremia On IV cefazolin antibiotic per ID -total 6 weeks Work-up with TEE , will follow-up with cardiology Infectious disease following  *ESBL E. coli UTI probably a contaminant per ID  *Left lung pneumonia versus edema On IV cefazolin antibiotic  * CHF- acute on chronic diastolic dysfunction: - follow I's and O's and daily weights.   -Continue Cardizem.hold Coreg & lasix  * COPD- no acute exacerbation.  Continue Dulera, albuterol inhaler as needed. - PRN duo nebs  * Essential hypertension-continue Cardizem. Hold coreg  * GERD-continue Protonix.  Patient has had recurrent hospitalizations for similar reasons over the past 6 months. Long-term prognosis poor  Patient or Husband not realistic - every time I've tried to discuss goals of care - they've turned it down and requests transfer out to tertiary care  Palliative care following as well  She is at high risk for Code Blue, cardio-resp arrest, multi-organ failure and death  Please note patient is  hard of hearing and blind. This patient will be long stay here.  We made have to pursue rehab for her   All the records are reviewed and case discussed with Care Management/Social Workerr. Management plans discussed with the patient, Cardio, nursing and they are in agreement.  CODE STATUS: Full  code  TOTAL TIME TAKING CARE OF THIS PATIENT: 36 minutes.   More than 50% time spent in counseling, coordination of care     Delfino Lovett M.D on 03/07/2018 at 11:51 AM  Between 7am to 6pm - Pager - [phone]  After 6pm go to [email or web address] - password [email or web address] [email or web address]  [facility] [place] [facility]  Office  [phone]  CC: Primary care physician; Erasmo Downer, MD   Note: This dictation was prepared with Dragon dictation along with smaller phrase technology. Any transcriptional errors that result from this process are unintentional.

## 2018-03-07 NOTE — Progress Notes (Signed)
PICC line place today, asked MD to verify placement through CXR, ok to use if needed. RN will continue to monitor.  

## 2018-03-07 NOTE — Evaluation (Signed)
Physical Therapy Evaluation Patient Details Name: Jaime [name] MRN: [id number] DOB: Apr 24, 1927 Today's Date: 03/07/2018   History of Present Illness  presented to ER secondary to worsening back pain, LE weakness; admitted for management of back pain.  Hospital course significant for acute hypoxic respiratory failure requiring transfer to CCU and BiPAP (now weaned to 5L) and diagnosis of vertebral osteomyelitis/discitis/possible epidural abscess.  Clinical Impression  Upon evaluation, patient resting completely flat in bed, husband at bedside.  Mild SOB at rest, currently on 5L O2 via [place].  Back pain rated 8-9/10; very limited tolerance for any functional mobility.  Able to mobilize bilat UES throughout functional range (at least 4-/5); does endorse history of R shoulder pain/weakness (chronic changes).  Bilat LE generally weak and deconditioned (L > R), all planes, requiring act assist from therapist for movement throughout full ROM.  Denies radicular symptoms, numbness or paresthesia; does endorse increase in back pain with hip/knee flexion of L LE. Attempted bed mobility; however, patient tolerating only 1/2 roll towards R due to pain.  Required hand-over-hand guidance and assist for UE/LE placement to actively participate with mobility attempts.  Unable to tolerate or complete any further mobility at this time.  Anticipate significant (max/total +1-2) assist for any further ADL or mobility tasks.  Completely dependent at this time for any activities that require sitting upright.  Recommend use of total lift if OOB desired at this time. Also of note, patient with desat to 85% with very minimal activity (ROM assessment, rolling 1/2 to R) on 5L supplemental O2; unable to recover beyond 89% despite cuing for pursed lip breathing.  RN informed/aware and at bedside for nebulizer treatment.  Additional activity deferred as result. Will continue mobility/ADL assessment in subsequent sessions as  medical status, pain control allows. Would benefit from skilled PT to address above deficits and promote optimal return to PLOF; recommend transition to STR upon discharge from acute hospitalization pending ability to actively participate and progress with skilled interventions.  *Patient with order for lumbar corset, not present in room; will obtain prior to next session as patient able to tolerate mobility and donning of corset (unable to tolerate this date).  Per telephone clarification with Dr. Adriana Simas (neurosurgery), corset for pain control only; not necessary and can be removed if patient unable to tolerate.     Follow Up Recommendations SNF(pending ability to participate/progress)    Equipment Recommendations       Recommendations for Other Services       Precautions / Restrictions Precautions Precautions: Fall Required Braces or Orthoses: Spinal Brace Spinal Brace: Lumbar corset Restrictions Weight Bearing Restrictions: No      Mobility  Bed Mobility Overal bed mobility: Needs Assistance Bed Mobility: Rolling Rolling: Max assist         General bed mobility comments: tolerates only 1/2 roll prior to resistance of efforts (due to pain); mod desat to noted with minimal exertion as well  Transfers                 General transfer comment: unable to tolerate due to pain  Ambulation/Gait             General Gait Details: unable to tolerate due to pain  ADLs Grooming (face washing, hair combing), min assist-able to reach face/top of head with UEs-with set up of activity UBD/LBD-dependent in supine due to poor pain control, poor tolerance for position changes and movement Toilet transfer/toileting-dependent; unable to tolerate OOB attempts due to pain and SOB/desaturation  Wheelchair Mobility    Modified Rankin (Stroke Patients Only)       Balance                                             Pertinent Vitals/Pain  Pain Assessment: Faces Faces Pain Scale: Hurts whole lot Pain Location: back Pain Descriptors / Indicators: Aching;Grimacing;Guarding Pain Intervention(s): Limited activity within patient's tolerance;Monitored during session;Repositioned    Home Living Family/patient expects to be discharged to:: Private residence Living Arrangements: Spouse/significant other Available Help at Discharge: Family;Available 24 hours/day Type of Home: House Home Access: Ramped entrance              Prior Function Level of Independence: Independent with assistive device(s)         Comments: Mod indep with SPC/RW for ADLs, household mobilization; spouse assists with household chores as needed.  No home O2.  Does endorse at least 4-5 falls over previous three weeks.  Reports significant functioanl decline in recent weeks due to worsening back pain.     Hand Dominance   Dominant Hand: Right    Extremity/Trunk Assessment   Upper Extremity Assessment Upper Extremity Assessment: Generalized weakness(grossly 4-/5 throughout)    Lower Extremity Assessment Lower Extremity Assessment: Generalized weakness(L LE grossly 3-/5, R LE grossly 4-/5; generally guarded due to pain.  Denies numbness/paresthesia, radicular symptoms)       Communication   Communication: HOH  Cognition Arousal/Alertness: Awake/alert Behavior During Therapy: Flat affect Overall Cognitive Status: Within Functional Limits for tasks assessed                                        General Comments      Exercises     Assessment/Plan    PT Assessment Patient needs continued PT services  PT Problem List Decreased strength;Decreased range of motion;Decreased activity tolerance;Decreased balance;Decreased mobility;Decreased coordination;Decreased cognition;Decreased knowledge of use of DME;Cardiopulmonary status limiting activity;Decreased knowledge of precautions;Decreased safety awareness;Obesity;Pain        PT Treatment Interventions DME instruction;Gait training;Functional mobility training;Therapeutic activities;Therapeutic exercise;Balance training;Patient/family education    PT Goals (Current goals can be found in the Care Plan section)  Acute Rehab PT Goals Patient Stated Goal: to improve back pain PT Goal Formulation: With patient/family Time For Goal Achievement: 03/21/18 Potential to Achieve Goals: Fair Additional Goals Additional Goal #1: Assess and establish goals for OOB activities as appropriate    Frequency Min 2X/week   Barriers to discharge Decreased caregiver support      Co-evaluation               AM-PAC PT "6 Clicks" Mobility  Outcome Measure Help needed turning from your back to your side while in a flat bed without using bedrails?: Total Help needed moving from lying on your back to sitting on the side of a flat bed without using bedrails?: Total Help needed moving to and from a bed to a chair (including a wheelchair)?: Total Help needed standing up from a chair using your arms (e.g., wheelchair or bedside chair)?: Total Help needed to walk in hospital room?: Total Help needed climbing 3-5 steps with a railing? : Total 6 Click Score: 6    End of Session Equipment Utilized During Treatment: Gait belt Activity Tolerance: Patient limited by pain;Treatment limited secondary  to medical complications (Comment)(desat to 85% with minimal exertion) Patient left: in bed;with call bell/phone within reach;with bed alarm set;with family/visitor present;with nursing/sitter in room Nurse Communication: Mobility status PT Visit Diagnosis: Pain;Difficulty in walking, not elsewhere classified (R26.2);Muscle weakness (generalized) (M62.81);Repeated falls (R29.6)    Time: 1610-9604 PT Time Calculation (min) (ACUTE ONLY): 17 min   Charges:   PT Evaluation $PT Eval High Complexity: 1 High         Jaqwon Manfred H. Manson Passey, PT, DPT, NCS 03/07/18, 3:20  PM [phone]

## 2018-03-07 NOTE — Progress Notes (Signed)
ID Saw patient this evening Out of ICU Has some cough and sob Lying  in bed because of back pain MSSA bacteremia  .CT done yesterday showed   Probable discitis/osteomyelitis T12-L1 with significant progressive destruction of the inferior endplate of T12 since 02/17/2018. 2. Possible left anterior epidural abscess at the L1 level, with presumed contiguous extension into left paraspinal phlegmon and left psoas abscess. Will need 6 -8 weeks of IV antibiotic Seen by neurosurgery and ortho- needs back brace Palliative seen her as well

## 2018-03-07 NOTE — [place] FL2 (Signed)
[place] MEDICAID FL2 LEVEL OF CARE SCREENING TOOL     IDENTIFICATION  Patient Name: Jaime [name] Birthdate: 1928/01/22 Sex: female Admission Date (Current Location): 03/01/2018  [place]   Facility and Address:  [facility], [address], [place], [place] [address]      Provider Number: [id number]  Attending Physician Name and Address:  Delfino LovettShah, Vipul, MD  Relative Name and Phone Number:  Iris PertVandervelde,William Spouse [phone]  [phone] or Stuckey,[name] Daughter [phone]  [phone]     Current Level of Care: Hospital Recommended Level of Care: Skilled Nursing Facility Prior Approval Number:    Date Approved/Denied:   PASRR Number: [id number]  Discharge Plan: SNF    Current Diagnoses: Patient Active Problem List   Diagnosis Date Noted  . Weakness   . Difficulty walking 03/01/2018  . Persistent atrial fibrillation 02/25/2018  . Acute on chronic diastolic CHF (congestive heart failure) (HCC) 02/25/2018  . Paroxysmal atrial fibrillation (HCC)   . Respiratory failure (HCC) 02/19/2018  . Severe episode of recurrent major depressive disorder, with psychotic features (HCC)   . Back pain 02/17/2018  . Chronic midline low back pain without sciatica   . DNR (do not resuscitate) discussion   . Palliative care by specialist   . Wheezing 01/19/2018  . Frequent falls 10/24/2017  . Near syncope 10/13/2017  . Dizziness 09/28/2017  . Delusional disorder (HCC) 06/03/2017  . Paranoia (HCC) 06/03/2017  . Chronic venous insufficiency 03/30/2017  . Lymphedema 03/30/2017  . Agitation   . Blind   . HOH (hard of hearing)   . Acute respiratory failure (HCC)   . Traumatic brain injury (HCC) 07/30/2014  . 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 Anemia 10/20/2011  . OA (osteoarthritis) of knee 10/18/2011    Orientation RESPIRATION BLADDER Height & Weight     Self, Time, Situation, Place  O2(5L)  Continent Weight: 202 lb 14.4 oz (92 kg) Height:  5\' 2"  (157.5 cm)  BEHAVIORAL SYMPTOMS/MOOD NEUROLOGICAL BOWEL NUTRITION STATUS      Continent Diet(Dysphagia 2)  AMBULATORY STATUS COMMUNICATION OF NEEDS Skin   Limited Assist Verbally Normal                       Personal Care Assistance Level of Assistance  Bathing, Feeding, Dressing Bathing Assistance: Limited assistance Feeding assistance: Limited assistance Dressing Assistance: Limited assistance     Functional Limitations Info  Sight, Hearing, Speech Sight Info: Adequate Hearing Info: Adequate Speech Info: Adequate    SPECIAL CARE FACTORS FREQUENCY  PT (By licensed PT), OT (By licensed OT)     PT Frequency: 5x a week OT Frequency: 5x a week            Contractures Contractures Info: Not present    Additional Factors Info  Code Status, Allergies, Psychotropic, Isolation Precautions Code Status Info: Full Code Allergies Info: CODEINE, LEVOFLOXACIN, OXYCODONE, PREDNISONE, TRAMADOL HCL  Psychotropic Info: risperiDONE (RISPERDAL) tablet 0.25 mg    Isolation Precautions Info: ESBL in urine     Current Medications (03/07/2018):  This is the current hospital active medication list Current Facility-Administered Medications  Medication Dose Route Frequency Provider Last Rate Last Dose  . 0.9 %  sodium chloride infusion   Intravenous PRN Uvaldo RisingSamaan, Maged, MD   Stopped at 03/07/18 1147  . acetaminophen (TYLENOL) tablet 650 mg  650 mg Oral Q6H PRN Houston SirenSainani, Vivek J, MD   650 mg at 03/04/18 0321   Or  .  acetaminophen (TYLENOL) suppository 650 mg  650 mg Rectal Q6H PRN Houston Siren, MD      . albuterol (PROVENTIL) (2.5 MG/3ML) 0.083% nebulizer solution 2.5 mg  2.5 mg Inhalation Q4H PRN Houston Siren, MD   2.5 mg at 03/07/18 1453  . ceFAZolin (ANCEF) IVPB 2g/100 mL premix  2 g Intravenous Q8H Lynn Ito, MD   Stopped at 03/07/18 1050  . cholecalciferol (VITAMIN D) tablet 1,000 Units  1,000 Units Oral Daily  Houston Siren, MD   1,000 Units at 03/07/18 1019  . diclofenac sodium (VOLTAREN) 1 % transdermal gel 2 g  2 g Topical QID Houston Siren, MD   2 g at 03/06/18 2223  . diltiazem (CARDIZEM CD) 24 hr capsule 240 mg  240 mg Oral Daily Samaan, Maged, MD   240 mg at 03/07/18 1019  . ipratropium-albuterol (DUONEB) 0.5-2.5 (3) MG/3ML nebulizer solution 3 mL  3 mL Nebulization Q6H PRN Houston Siren, MD      . ketorolac (TORADOL) 15 MG/ML injection 7.5 mg  7.5 mg Intravenous Q6H PRN Sherryll Burger, Vipul, MD      . magnesium oxide (MAG-OX) tablet 400 mg  400 mg Oral Daily Houston Siren, MD   400 mg at 03/07/18 1019  . MEDLINE mouth rinse  15 mL Mouth Rinse BID Delfino Lovett, MD   15 mL at 03/07/18 1023  . mirabegron ER (MYRBETRIQ) tablet 25 mg  25 mg Oral Daily Houston Siren, MD   25 mg at 03/07/18 1019  . mometasone-formoterol (DULERA) 100-5 MCG/ACT inhaler 2 puff  2 puff Inhalation BID Houston Siren, MD   2 puff at 03/07/18 1023  . morphine 2 MG/ML injection 1 mg  1 mg Intravenous Q4H PRN Delfino Lovett, MD      . pantoprazole (PROTONIX) EC tablet 40 mg  40 mg Oral Daily Houston Siren, MD   40 mg at 03/07/18 1019  . risperiDONE (RISPERDAL) tablet 0.25 mg  0.25 mg Oral QHS Houston Siren, MD   0.25 mg at 03/06/18 2226  . sodium chloride flush (NS) 0.9 % injection 3 mL  3 mL Intravenous Q12H Sherryll Burger, Vipul, MD   3 mL at 03/07/18 1011  . sodium chloride flush (NS) 0.9 % injection 3 mL  3 mL Intravenous Q12H Sherryll Burger, Vipul, MD   3 mL at 03/07/18 1011  . sodium chloride flush (NS) 0.9 % injection 3 mL  3 mL Intracatheter PRN Delfino Lovett, MD      . sodium phosphate (FLEET) 7-19 GM/118ML enema 1 enema  1 enema Rectal Daily PRN Ihor Austin, MD         Discharge Medications: Please see discharge summary for a list of discharge medications.  Relevant Imaging Results:  Relevant Lab Results:   Additional Information SSN [id number]  Darleene Cleaver, [place]

## 2018-03-07 NOTE — Progress Notes (Signed)
Peripherally Inserted Central Catheter/Midline Placement  The IV Nurse has discussed with the patient and/or persons authorized to consent for the patient, the purpose of this procedure and the potential benefits and risks involved with this procedure.  The benefits include less needle sticks, lab draws from the catheter, and the patient may be discharged home with the catheter. Risks include, but not limited to, infection, bleeding, blood clot (thrombus formation), and puncture of an artery; nerve damage and irregular heartbeat and possibility to perform a PICC exchange if needed/ordered by physician.  Alternatives to this procedure were also discussed.  Bard Power PICC patient education guide, fact sheet on infection prevention and patient information card has been provided to patient /or left at bedside.    PICC/Midline Placement Documentation  PICC Single Lumen 03/07/18 PICC Right Brachial 38 cm 0 cm (Active)  Indication for Insertion or Continuance of Line Home intravenous therapies (PICC only) 03/07/2018  5:00 PM  Exposed Catheter (cm) 0 cm 03/07/2018  5:00 PM  Site Assessment Clean;Dry;Intact 03/07/2018  5:00 PM  Line Status Flushed;Blood return noted 03/07/2018  5:00 PM  Dressing Type Transparent 03/07/2018  5:00 PM  Dressing Status Clean;Dry;Intact;Antimicrobial disc in place 03/07/2018  5:00 PM  Dressing Change Due 03/14/18 03/07/2018  5:00 PM       Jaime [name], Jaime [name] 03/07/2018, 5:58 PM

## 2018-03-08 DIAGNOSIS — R627 Adult failure to thrive: Secondary | ICD-10-CM

## 2018-03-08 DIAGNOSIS — R0600 Dyspnea, unspecified: Secondary | ICD-10-CM

## 2018-03-08 LAB — TYPE AND SCREEN
ABO/RH(D): A NEG
Antibody Screen: POSITIVE
DONOR AG TYPE: NEGATIVE
UNIT DIVISION: 0
UNIT DIVISION: 0
Unit division: 0

## 2018-03-08 LAB — BASIC METABOLIC PANEL
Anion gap: 4 — ABNORMAL LOW (ref 5–15)
BUN: 16 mg/dL (ref 8–23)
CHLORIDE: 106 mmol/L (ref 98–111)
CO2: 31 mmol/L (ref 22–32)
Calcium: 7.9 mg/dL — ABNORMAL LOW (ref 8.9–10.3)
Creatinine, Ser: 0.65 mg/dL (ref 0.44–1.00)
GFR calc non Af Amer: >60 mL/min (ref >60)
Glucose, Bld: 104 mg/dL — ABNORMAL HIGH (ref 70–99)
POTASSIUM: 4.4 mmol/L (ref 3.5–5.1)
SODIUM: 141 mmol/L (ref 135–145)

## 2018-03-08 LAB — CBC
HCT: 23 % — ABNORMAL LOW (ref 36.0–46.0)
HEMOGLOBIN: 7.1 g/dL — AB (ref 12.0–15.0)
MCH: 31.7 pg (ref 26.0–34.0)
MCHC: 30.9 g/dL (ref 30.0–36.0)
MCV: 102.7 fL — ABNORMAL HIGH (ref 80.0–100.0)
Platelets: 273 10*3/uL (ref 150–400)
RBC: 2.24 MIL/uL — AB (ref 3.87–5.11)
RDW: 24.9 % — ABNORMAL HIGH (ref 11.5–15.5)
WBC: 12.4 10*3/uL — ABNORMAL HIGH (ref 4.0–10.5)

## 2018-03-08 LAB — BPAM RBC
Blood Product Expiration Date: 201912032359
Blood Product Expiration Date: 201912112359
Blood Product Expiration Date: 201912132359
ISSUE DATE / TIME: 201911231951
UNIT TYPE AND RH: 600
Unit Type and Rh: 600
Unit Type and Rh: 600

## 2018-03-08 LAB — CULTURE, BLOOD (ROUTINE X 2)
CULTURE: NO GROWTH
Culture: NO GROWTH
SPECIAL REQUESTS: ADEQUATE
Special Requests: ADEQUATE

## 2018-03-08 LAB — C-REACTIVE PROTEIN: CRP: 19.8 mg/dL — ABNORMAL HIGH (ref <1.0)

## 2018-03-08 LAB — SEDIMENTATION RATE: SED RATE: 126 mm/h — AB (ref 0–30)

## 2018-03-08 MED ORDER — SODIUM CHLORIDE 0.9% IV SOLUTION
Freq: Once | INTRAVENOUS | Status: AC
  Administered 2018-03-08: 18:00:00 via INTRAVENOUS

## 2018-03-08 NOTE — Care Management (Signed)
Per Dr. Sherryll BurgerShah, patient is agreeable to SNF but not medically ready today.  Continues on 6L O2.

## 2018-03-08 NOTE — Progress Notes (Signed)
[facility] [place] [facility]   PATIENT NAME: Jaime [name]    MR#:  [id number]  DATE OF BIRTH:  03-16-1928  SUBJECTIVE: Since seen in ICU, off the BiPAP, appears slightly more comfortable with breathing but has back pain.  CHIEF COMPLAINT:   Chief Complaint  Patient presents with  . Back Pain  . Weakness  sleepy, lethargic,. Needing more higher level of O2 and Hb dropped. Husband still wants everything done REVIEW OF SYSTEMS:  ROS unable to obtain due to lethargy DRUG ALLERGIES:   Allergies  Allergen Reactions  . Codeine Nausea And Vomiting    Pt states she can't take oxycodone.  She states she can take Percocet.  . Levofloxacin Other (See Comments)  . Oxycodone Nausea And Vomiting  . Prednisone     Patient states she had a stroke when she took prednisone before.  . Tramadol Hcl Anxiety    VITALS:  Blood pressure (!) 139/56, pulse 61, temperature (!) 97.4 F (36.3 C), temperature source Oral, resp. rate 18, height 5\' 2"  (1.575 m), weight 92 kg, SpO2 93 %. PHYSICAL EXAMINATION:  GENERAL:  84 y.o.-year-old patient lying in the bed on oxygen via nasal cannula (6-8 liters now) EYES: Pupils equal, round, reactive to light . No scleral icterus. Extraocular muscles intact.  HEENT: Head atraumatic, normocephalic. Oropharynx and nasopharynx clear.  NECK:  Supple, no jugular venous distention. No thyroid enlargement, no tenderness.  LUNGS: Bilateral coarse breath sounds present, basilar crepitus present. CARDIOVASCULAR: S1, S2 normal. No murmurs, rubs, or gallops.  ABDOMEN: Soft, nontender, nondistended. Bowel sounds present. No organomegaly or mass.  EXTREMITIES: No pedal edema NEUROLOGIC: Nonfocal. Legally Blind PSYCHIATRIC: The patient is lethargic SKIN: No obvious rash, lesion, or ulcer. LABORATORY PANEL:   CBC Recent Labs  Lab 03/08/18 0437  WBC 12.4*  HGB 7.1*  HCT 23.0*  PLT 273    ------------------------------------------------------------------------------------------------------------------  Chemistries  Recent Labs  Lab 03/05/18 0556  03/08/18 0437  NA 141   < > 141  K 3.8   < > 4.4  CL 103   < > 106  CO2 31   < > 31  GLUCOSE 123*   < > 104*  BUN 26*   < > 16  CREATININE 0.58   < > 0.65  CALCIUM 7.5*   < > 7.9*  MG 2.0  --   --   AST 12*  --   --   ALT 7  --   --   ALKPHOS 65  --   --   BILITOT 1.1  --   --    < > = values in this interval not displayed.   ------------------------------------------------------------------------------------------------------------------  Cardiac Enzymes Recent Labs  Lab 03/04/18 0211  TROPONINI 0.03*   ------------------------------------------------------------------------------------------------------------------  RADIOLOGY:  Dg Chest Port 1 View  Result Date: 03/07/2018 CLINICAL DATA:  PICC placement EXAM: PORTABLE CHEST 1 VIEW COMPARISON:  03/05/2018 FINDINGS: Right arm PICC tip enters the SVC with the tip not well visualized due to patient size. Cardiac enlargement and pulmonary vascular congestion and mild edema unchanged. Small bilateral effusions. Left lower lobe atelectasis has progressed. IMPRESSION: Right arm PICC tip not well visualized. Catheter is seen extending into the SVC Congestive heart failure with mild edema and bilateral effusions unchanged. Progressive left lower lobe atelectasis. Electronically Signed   By: Marlan Palauharles  Clark M.D.   On: 03/07/2018 19:19   [place] Ekg Site Rite  Result Date: 03/07/2018 If Site Rite image not attached, placement could  not be confirmed due to current cardiac rhythm.  EKG:   Orders placed or performed during the hospital encounter of 03/01/18  . ED EKG  . ED EKG  . EKG 12-Lead  . EKG 12-Lead  . EKG 12-Lead  . EKG 12-Lead   ASSESSMENT AND PLAN:  84 year old female with past medical history of hypertension, previous history of spinal stenosis, CHF,  compression fracture, osteoarthritis, chronic lymphedema of the lower extremities, chronic anemia who presents to the hospital due to back pain, difficulty ambulating.  * Acute hypoxic respiratory failure: improved and off BiPAP - Continue oxygen via nasal cannula -now needing 6-8 L oxygen via nasal cannula - She had refused BiPAP last night  * Acute metabolic encephalopathy - reacts strongly to narcotics so try to avoid  * Anemia of chronic dz - Hb 7.1. Will order 1 PRBC today  * Vertebral Osteomyelitis/Discitis/Epidural Abscess? - will need 6 weeks of IV Abx, d/w Ortho Dr Ernest Pine - ID following - Neurosurgery recommends conservative mgmt - back brace  * Acute on Chronic back pain - Will try tylenolol (mild), toeradol (moderate) and morphine for severe pain as need -Avoid narcotics if possible  *MSSA bacteremia: PICC placed on 03/07/18 Cefazolin 2 grams IV every 8 hours until 04/15/18 per ID Infectious disease following  *ESBL E. coli UTI probably a contaminant per ID  *Left lung pneumonia versus edema On IV cefazolin antibiotic  * CHF- acute on chronic diastolic dysfunction: - follow I's and O's and daily weights.   -Continue Cardizem.hold Coreg & lasix  * COPD- no acute exacerbation.  Continue Dulera, albuterol inhaler as needed. - PRN duo nebs  * Essential hypertension-continue Cardizem. Hold coreg  * GERD-continue Protonix.    Patient has had recurrent hospitalizations for similar reasons over the past 6 months. Long-term prognosis poor.  Patient or Husband not realistic - every time I've tried to discuss goals of care - they've turned it down and requests transfer out to tertiary care  Palliative care following as well  She is at high risk for Code Blue, cardio-resp arrest, multi-organ failure and death, if worsens, may need to transfer back to ICU  Please note patient is  hard of hearing and blind. This patient will be long stay here.  We may have to pursue  rehab for her   All the records are reviewed and case discussed with Care Management/Social Workerr. Management plans discussed with the patient, Cardio, nursing and they are in agreement.  CODE STATUS: Full code  TOTAL TIME TAKING CARE OF THIS PATIENT: 35 minutes.   More than 50% time spent in counseling, coordination of care     Delfino Lovett M.D on 03/08/2018 at 10:14 AM  Between 7am to 6pm - Pager - [phone]  After 6pm go to [email or web address] - password [email or web address] [facility]  [facility]  Office  [phone]  CC: Primary care physician; Erasmo Downer, MD   Note: This dictation was prepared with Dragon dictation along with smaller phrase technology. Any transcriptional errors that result from this process are unintentional.

## 2018-03-08 NOTE — Progress Notes (Signed)
Educated patient's husband on the importance of safe oral intake and that pt should sit fully upright in bed with head forward. Upon entering room husband was feeding patient laying down. Educated husband again. He verbalized understanding. Will continue to monitor

## 2018-03-08 NOTE — Progress Notes (Signed)
PT Cancellation Note  Patient Details Name: Jaime [name] MRN: [id number] DOB: Feb 28, 1928   Cancelled Treatment:    Reason Eval/Treat Not Completed: Medical issues which prohibited therapy.  Pt is awaiting transfusion, and also does not have a back brace as requested.  Nursing was not sure of vendor or source of brace, and will ck on this.  Try back at another time.  Nursing will begin transfusion soon.   Ivar DrapeRuth E Langdon Crosson 03/08/2018, 2:24 PM   Samul Dadauth Torri Langston, PT MS Acute Rehab Dept. Number: [facility] [id number] and [facility] [phone]

## 2018-03-08 NOTE — Progress Notes (Signed)
Patient ID: Jaime [name], female   DOB: May 11, 1927, 84 y.o.   MRN: [id number]  This NP visited patient at the bedside as a follow up for palliative medicine needs and emotional support. Patient's husband is at the bedside.    Patient herself  is weak and her shortness of breath weakens on minimal exertion. She is requiring 6 / liters nasal cannula to support SATs.    Continued  conversation with husband regarding the seriousness of the current medical situation.  We discussed the limitations of medical interventions to prolong quality of life when the body begins to fail to thrive.  We discussed human mortality.  Again I  discussed the importance of documentation of advanced directives specific to desire for intubation in the event of respiratory decline/failure.  She had refused BiPAP last night. She is high risk to decompensate  At this time  her husband is  open to all offered and available medical interventions to prolong life,    "do  what you have to do"  I shared with Mr. Jaime [name] my concern that  simply just doing things to Jaime Bibleat we may actually be doing more harm.  I shared with him my concern that we may not be able to turn this around.   Created space and opportunity for husband to explore thoughts and feelings regarding current medical situation.   He tells me the patient's daughters are in agreement and support the husband's decisions.  Questions and concerns addressed   Discussed with Dr Sherryll BurgerShah  Total time spent on the unit was 45 minutes  I made husband  aware that I would not be the provider from the palliative medicine team moving forward,  but that he can call the team phone number for any questions or concerns and one of our PMT providers will f/u  Greater than 50% of the time was spent in counseling and coordination of care  Jaime CreedMary Jax Abdelrahman NP  Palliative Medicine Team Team Phone # [phone]- [phone] Pager [phone]

## 2018-03-08 NOTE — Evaluation (Signed)
Occupational Therapy Evaluation Patient Details Name: Jaime [name] MRN: [id number] DOB: [birth year] Today's Date: 03/08/2018    History of Present Illness presented to ER secondary to worsening back pain, LE weakness; admitted for management of back pain.  Hospital course significant for acute hypoxic respiratory failure requiring transfer to CCU and BiPAP (now weaned to 5L) and diagnosis of vertebral osteomyelitis/discitis/possible epidural abscess.   Clinical Impression    Patient resting completely flat in bed, husband at bedside.  Moderate SOB at rest, currently on 5L O2 via [place].  [name] indicated she was not on any O2 at home prior to admission.  Back pain rated 10/10; very limited tolerance for any functional mobility.  Able to mobilize bilat UES throughout functional range (at least 4-/5).She is responding to questions but is lethargic and asking her husband, "is it time to go yet?"  Pt is overall very weak and deconditioned (L > R), all planes, requiring act assist from therapist for movement throughout full ROM and for hand to face movement pattern for light grooming task.  Pt not able to complete task due to lethargy which is a change from yesterday.    .Anticipate significant (max/total +1-2) assist for any further ADL tasks.  Completely dependent at this time for any activities that require sitting upright.  PT is recommending use of total lift if OOB desired at this time. Also of note, patient with increased WOB and husband stated she was supposed to have a breathing treatment and Erica from [facility] updated.    Will continue mobility/ADL assessment in subsequent sessions as medical status, pain control allows. Would benefit from skilled OT to address above deficits and promote optimal return to PLOF; recommend transition to STR upon discharge from acute hospitalization pending ability to actively participate and progress with skilled interventions.  *Per PT note:  Patient  with order for lumbar corset, not present in room; will obtain prior to next session as patient able to tolerate mobility and donning of corset (unable to tolerate this date).  Per telephone clarification with Dr. Adriana Simasook (neurosurgery), corset for pain control only; not necessary and can be removed if patient unable to tolerate.   Spoke to [place] about the corsett and she indicated [place] and [facility]. Will update PT, Cephus SlaterKristen Brown.       Follow Up Recommendations  SNF    Equipment Recommendations       Recommendations for Other Services       Precautions / Restrictions Precautions Precautions: Fall Required Braces or Orthoses: Spinal Brace Spinal Brace: Lumbar corset(spoke to Eric from [facility] and corsett has been ordered by not received yet and might not have one in [facility] supply) Restrictions Weight Bearing Restrictions: No      Mobility Bed Mobility                  Transfers                      Balance                                           ADL either performed or assessed with clinical judgement   ADL Overall ADL's : Needs assistance/impaired Eating/Feeding: Set up;Moderate assistance;Bed level Eating/Feeding Details (indicate cue type and reason): Pt is on  thickened liquids and modified diet with difficulty sitting up in bed to be able to eat due to pain in mid back.  Also on 5L O2 nasal cannula with increase WOB Grooming: Maximal assistance;Set up;Wash/dry face Grooming Details (indicate cue type and reason): Pt needed assist to bring hand to face and was not able to complete more than a washcloth to face before fatiguing and c/o pain.                               General ADL Comments: Pt is very limited in any bed mobility due to pain and was only alert enough to bring wash cloth to face but not able to move cloth around long enough to  clean due to lethargy and increased pain with any movement.  Discussed rec with husband and pt about rec for AD including reacher, sock aid, elastic shoe laces, etc to help manage pain as she improves but her husband did not seem very interested.       Vision Baseline Vision/History: Legally blind       Perception     Praxis      Pertinent Vitals/Pain Pain Assessment: Faces Faces Pain Scale: Hurts worst Pain Location: back Pain Descriptors / Indicators: Aching;Grimacing;Guarding Pain Intervention(s): Limited activity within patient's tolerance;Monitored during session;Repositioned     Hand Dominance Right   Extremity/Trunk Assessment Upper Extremity Assessment Upper Extremity Assessment: Generalized weakness   Lower Extremity Assessment Lower Extremity Assessment: Generalized weakness       Communication Communication Communication: HOH   Cognition Arousal/Alertness: Lethargic Behavior During Therapy: Flat affect Overall Cognitive Status: Difficult to assess                                 General Comments: Pt is blind and hears better in L ear   General Comments       Exercises     Shoulder Instructions      Home Living Family/patient expects to be discharged to:: Private residence Living Arrangements: Spouse/significant other Available Help at Discharge: Family;Available 24 hours/day Type of Home: House Home Access: Ramped entrance     Home Layout: One level     Bathroom Shower/Tub: Walk-in shower;Door   [employer] Toilet: Handicapped height Bathroom Accessibility: Yes   Home Equipment: Shower seat;Cane - single point;Walker - 2 wheels   Additional Comments: Husband avaiable 24/7--pt is blind and reads Braille and has many talking clocks in the home and a printer that prints in Braille and voice dictation on computer      Prior Functioning/Environment Level of Independence: Independent with assistive device(s)        Comments: Mod  indep with SPC/RW for ADLs, household mobilization; spouse assists with household chores as needed.  No home O2.  Does endorse at least 4-5 falls over previous three weeks.  Reports significant functioanl decline in recent weeks due to worsening back pain.        OT Problem List: Pain;Decreased strength;Impaired balance (sitting and/or standing);Decreased range of motion;Impaired vision/perception;Decreased activity tolerance;Decreased knowledge of use of DME or AE;Obesity      OT Treatment/Interventions: Self-care/ADL training;DME and/or AE instruction;Therapeutic activities;Balance training;Patient/family education    OT Goals(Current goals can be found in the care plan section) Acute Rehab OT Goals Patient Stated Goal: to improve back pain OT Goal Formulation: With patient/family Time For Goal Achievement: 03/22/18 Potential to Achieve  Goals: Fair ADL Goals Pt Will Perform Eating: with set-up;with min assist;bed level Pt Will Perform Grooming: with set-up;with supervision;sitting Pt Will Perform Upper Body Dressing: with min assist;sitting Pt Will Perform Lower Body Dressing: with max assist;sit to/from stand;with adaptive equipment Pt Will Transfer to Toilet: with max assist;stand pivot transfer;bedside commode;with set-up  OT Frequency: Min 1X/week(rec 1 time a week until pt is able to tolerate more activity and pain is better controlled )   Barriers to D/C:            Co-evaluation              AM-PAC OT "6 Clicks" Daily Activity     Outcome Measure Help from another person eating meals?: A Lot Help from another person taking care of personal grooming?: A Lot Help from another person toileting, which includes using toliet, bedpan, or urinal?: Total Help from another person bathing (including washing, rinsing, drying)?: Total Help from another person to put on and taking off regular upper body clothing?: Total Help from another person to put on and taking off regular  lower body clothing?: Total 6 Click Score: 8   End of Session Nurse Communication: Other (comment)([name] asking about breathing treatment and Erica from [facility] notified.  ALso talked to her about lumbar corsett and she indicate that [facility] did not have one and suggested calling [facility] to see if they can send one over)  Activity Tolerance: Patient limited by pain;Patient limited by lethargy Patient left: in bed;with call bell/phone within reach;with bed alarm set;with family/visitor present  OT Visit Diagnosis: Pain;Muscle weakness (generalized) (M62.81);Low vision, both eyes (H54.2);Other abnormalities of gait and mobility (R26.89) Pain - part of body: (mid back bilaterally)                Time: 1430-1455 OT Time Calculation (min): 25 min Charges:  OT General Charges $OT Visit: 1 Visit OT Evaluation $OT Eval High Complexity: 1 High OT Treatments $Self Care/Home Management : 8-22 mins  Susanne Borders, OTR/L ascom [phone] 03/08/18, 3:27 PM

## 2018-03-08 NOTE — Progress Notes (Signed)
Speech Therapy note: ensured aspiration precautions signs were posted in room. Pt was recommended to be on a Dysphagia diet w/ NECTAR consistency liquids per MBSS in May 2019. Pt is currently on a Dysphagia level 2 diet w/ Nectar liquids post BSE and taking some po's per report. Pt is challenged by her ability to sit upright d/t back issues and discomfort baseline. MD to reconsult if any decline in pt's status warranting further skilled ST services.    Jerilynn SomKatherine Akiba Melfi, MS, CCC-SLP

## 2018-03-09 LAB — CBC
HEMATOCRIT: 26.4 % — AB (ref 36.0–46.0)
HEMOGLOBIN: 8.2 g/dL — AB (ref 12.0–15.0)
MCH: 31.2 pg (ref 26.0–34.0)
MCHC: 31.1 g/dL (ref 30.0–36.0)
MCV: 100.4 fL — ABNORMAL HIGH (ref 80.0–100.0)
PLATELETS: 246 10*3/uL (ref 150–400)
RBC: 2.63 MIL/uL — AB (ref 3.87–5.11)
RDW: 24.5 % — ABNORMAL HIGH (ref 11.5–15.5)
WBC: 10.4 10*3/uL (ref 4.0–10.5)
nRBC: 1.1 % — ABNORMAL HIGH (ref 0.0–0.2)

## 2018-03-09 LAB — BASIC METABOLIC PANEL
ANION GAP: 5 (ref 5–15)
BUN: 16 mg/dL (ref 8–23)
CO2: 31 mmol/L (ref 22–32)
CREATININE: 0.55 mg/dL (ref 0.44–1.00)
Calcium: 8.4 mg/dL — ABNORMAL LOW (ref 8.9–10.3)
Chloride: 107 mmol/L (ref 98–111)
Glucose, Bld: 107 mg/dL — ABNORMAL HIGH (ref 70–99)
Potassium: 4.4 mmol/L (ref 3.5–5.1)
SODIUM: 143 mmol/L (ref 135–145)

## 2018-03-09 LAB — PREPARE RBC (CROSSMATCH)

## 2018-03-09 MED ORDER — IPRATROPIUM-ALBUTEROL 0.5-2.5 (3) MG/3ML IN SOLN
3.0000 mL | Freq: Four times a day (QID) | RESPIRATORY_TRACT | Status: DC
  Administered 2018-03-09 – 2018-03-11 (×9): 3 mL via RESPIRATORY_TRACT
  Filled 2018-03-09 (×9): qty 3

## 2018-03-09 MED ORDER — CHLORHEXIDINE GLUCONATE 0.12 % MT SOLN
15.0000 mL | Freq: Two times a day (BID) | OROMUCOSAL | Status: DC
  Administered 2018-03-09 – 2018-03-13 (×9): 15 mL via OROMUCOSAL
  Filled 2018-03-09 (×9): qty 15

## 2018-03-09 MED ORDER — FUROSEMIDE 10 MG/ML IJ SOLN
40.0000 mg | Freq: Once | INTRAMUSCULAR | Status: AC
  Administered 2018-03-09: 40 mg via INTRAVENOUS
  Filled 2018-03-09: qty 4

## 2018-03-09 MED ORDER — ORAL CARE MOUTH RINSE
15.0000 mL | Freq: Two times a day (BID) | OROMUCOSAL | Status: DC
  Administered 2018-03-12: 15 mL via OROMUCOSAL

## 2018-03-09 NOTE — Progress Notes (Signed)
Patient declined Bipap at this time.  

## 2018-03-09 NOTE — Progress Notes (Signed)
Husband refuses to elevate her head when he feeds her.  I explained, again, that she could, and likely does aspirate when laying flat.  I asked him directly if he understood that feeding her this way could harm her. He said he understood but that she would not eat while sitting up and that he was very careful.   I explained that his efforts to be careful feeding her would not prevent her from aspirating.  He again said he understood.

## 2018-03-09 NOTE — Plan of Care (Signed)
°  Problem: Clinical Measurements: °Goal: Respiratory complications will improve °Outcome: Progressing °  °Problem: Pain Managment: °Goal: General experience of comfort will improve °Outcome: Progressing °  °Problem: Safety: °Goal: Ability to remain free from injury will improve °Outcome: Progressing °  °

## 2018-03-09 NOTE — Progress Notes (Signed)
[facility] [place] at [facility]   PATIENT NAME: Jaime [name]    MR#:  [id number]  DATE OF BIRTH:  May 16, 1927  SUBJECTIVE: Since seen in ICU, off the BiPAP, appears slightly more comfortable with breathing but has back pain.  CHIEF COMPLAINT:   Chief Complaint  Patient presents with  . Back Pain  . Weakness  Patient seen and evaluated today Responds to verbal commands Awake On oxygen via nasal cannula at 5 L Back pain better tolerated REVIEW OF SYSTEMS:  ROS  Review of Systems  Constitutional: Weakness HENT: Negative.   Eyes: Negative.   Respiratory: Chest congestion and shortness of breath Cardiovascular: Negative.   Gastrointestinal: Negative.   Genitourinary: Negative.   Musculoskeletal: Back pain Skin: Negative.   Neurological: Negative.   Endo/Heme/Allergies: Negative.   Psychiatric/Behavioral: Negative.   All other systems reviewed and are negative. DRUG ALLERGIES:   Allergies  Allergen Reactions  . Codeine Nausea And Vomiting    Pt states she can't take oxycodone.  She states she can take Percocet.  . Levofloxacin Other (See Comments)  . Oxycodone Nausea And Vomiting  . Prednisone     Patient states she had a stroke when she took prednisone before.  . Tramadol Hcl Anxiety    VITALS:  Blood pressure (!) 131/54, pulse (!) 53, temperature 97.7 F (36.5 C), temperature source Oral, resp. rate (!) 30, height 5\' 2"  (1.575 m), weight 97.3 kg, SpO2 94 %. PHYSICAL EXAMINATION:  GENERAL:  84 y.o.-year-old patient lying in the bed on oxygen via nasal cannula (6-8 liters now) EYES: Pupils equal, round, reactive to light . No scleral icterus. Extraocular muscles intact.  HEENT: Head atraumatic, normocephalic. Oropharynx and nasopharynx clear.  NECK:  Supple, no jugular venous distention. No thyroid enlargement, no tenderness.  LUNGS: Bilateral coarse breath sounds present, basilar crepitus present. CARDIOVASCULAR: S1, S2 normal. No  murmurs, rubs, or gallops.  ABDOMEN: Soft, nontender, nondistended. Bowel sounds present. No organomegaly or mass.  EXTREMITIES: No pedal edema NEUROLOGIC: Nonfocal. Legally Blind PSYCHIATRIC: The patient is lethargic SKIN: No obvious rash, lesion, or ulcer. LABORATORY PANEL:   CBC Recent Labs  Lab 03/09/18 0457  WBC 10.4  HGB 8.2*  HCT 26.4*  PLT 246   ------------------------------------------------------------------------------------------------------------------  Chemistries  Recent Labs  Lab 03/05/18 0556  03/09/18 0457  NA 141   < > 143  K 3.8   < > 4.4  CL 103   < > 107  CO2 31   < > 31  GLUCOSE 123*   < > 107*  BUN 26*   < > 16  CREATININE 0.58   < > 0.55  CALCIUM 7.5*   < > 8.4*  MG 2.0  --   --   AST 12*  --   --   ALT 7  --   --   ALKPHOS 65  --   --   BILITOT 1.1  --   --    < > = values in this interval not displayed.   ------------------------------------------------------------------------------------------------------------------  Cardiac Enzymes Recent Labs  Lab 03/04/18 0211  TROPONINI 0.03*   ------------------------------------------------------------------------------------------------------------------  RADIOLOGY:  Dg Chest Port 1 View  Result Date: 03/07/2018 CLINICAL DATA:  PICC placement EXAM: PORTABLE CHEST 1 VIEW COMPARISON:  03/05/2018 FINDINGS: Right arm PICC tip enters the SVC with the tip not well visualized due to patient size. Cardiac enlargement and pulmonary vascular congestion and mild edema unchanged. Small bilateral effusions. Left lower lobe atelectasis has progressed. IMPRESSION:  Right arm PICC tip not well visualized. Catheter is seen extending into the SVC Congestive heart failure with mild edema and bilateral effusions unchanged. Progressive left lower lobe atelectasis. Electronically Signed   By: Marlan Palau M.D.   On: 03/07/2018 19:19   [place] Ekg Site Rite  Result Date: 03/07/2018 If Site Rite image not attached,  placement could not be confirmed due to current cardiac rhythm.  EKG:   Orders placed or performed during the hospital encounter of 03/01/18  . ED EKG  . ED EKG  . EKG 12-Lead  . EKG 12-Lead  . EKG 12-Lead  . EKG 12-Lead   ASSESSMENT AND PLAN:  84 year old female with past medical history of hypertension, previous history of spinal stenosis, CHF, compression fracture, osteoarthritis, chronic lymphedema of the lower extremities, chronic anemia who presents to the hospital due to back pain, difficulty ambulating.  * Acute hypoxic respiratory failure: improved and off BiPAP - Continue oxygen via nasal cannula -now needing 6 oxygen via nasal cannula -Wean oxygen as tolerated  *Chest congestion and appears volume overloaded IV Lasix 40 mg Follow-up electrolytes  * Acute metabolic encephalopathy improving - reacts strongly to narcotics so try to avoid  * Anemia of chronic dz - Hb 8 after PRBC transfusion IV  * Vertebral Osteomyelitis/Discitis/Epidural Abscess? - will need 6 weeks of IV Abx, d/w Ortho Dr Ernest Pine - ID following - Neurosurgery recommends conservative mgmt - back brace  * Acute on Chronic back pain - Will try tylenolol (mild), toeradol (moderate) and morphine for severe pain as need -Avoid narcotics as much as possible  *MSSA bacteremia: PICC placed on 03/07/18 Cefazolin 2 grams IV every 8 hours until 04/15/18 per ID Infectious disease following  *ESBL E. coli UTI probably a contaminant per ID  *Left lung pneumonia versus edema On IV cefazolin antibiotic Agrees with Lasix for diuresis to improve lung compliance  * CHF- acute on chronic diastolic dysfunction: - follow I's and O's and daily weights.   -Continue Cardizem. -IV for diuresis  * COPD- no acute exacerbation.  Continue Dulera, albuterol inhaler as needed. - PRN duo nebs  * Essential hypertension-continue Cardizem. Hold coreg  * GERD-continue Protonix.    Patient has had recurrent  hospitalizations for similar reasons over the past 6 months. Long-term prognosis poor.  Patient's husband wants everything done for now  Palliative care following as well  She is at high risk for Code Blue, cardio-resp arrest, multi-organ failure and death, if worsens, may need to transfer back to ICU  Please note patient is  hard of hearing and blind. This patient will be long stay here.  We may have to pursue rehab for her   All the records are reviewed and case discussed with Care Management/Social Workerr. Management plans discussed with the patient, Cardio, nursing and they are in agreement.  CODE STATUS: Full code  TOTAL TIME TAKING CARE OF THIS PATIENT: 34 minutes.   More than 50% time spent in counseling, coordination of care     Ihor Austin M.D on 03/09/2018 at 12:10 PM  Between 7am to 6pm - Pager - [phone]  After 6pm go to [email or web address] - password [email or web address] [facility]  [facility]  Office  [phone]  CC: Primary care physician; Erasmo Downer, MD   Note: This dictation was prepared with Dragon dictation along with smaller phrase technology. Any transcriptional errors that result from this process are unintentional.

## 2018-03-09 NOTE — Clinical Social Work Note (Signed)
CSW attempted to call patient's husband and daughter to discuss bed offers, left message awaiting for call back.  Ervin KnackEric R. Ellicia Alix, MSW, Theresia MajorsLCSWA [phone]  03/09/2018 5:03 PM

## 2018-03-09 NOTE — Progress Notes (Signed)
Patient has voided 875 ml since the lasix 40 mg dose.  Breathing is still labored but improved since this morning.

## 2018-03-10 LAB — BASIC METABOLIC PANEL
ANION GAP: 5 (ref 5–15)
BUN: 15 mg/dL (ref 8–23)
CO2: 32 mmol/L (ref 22–32)
Calcium: 8.3 mg/dL — ABNORMAL LOW (ref 8.9–10.3)
Chloride: 103 mmol/L (ref 98–111)
Creatinine, Ser: 0.58 mg/dL (ref 0.44–1.00)
GFR calc Af Amer: >60 mL/min (ref >60)
GLUCOSE: 103 mg/dL — AB (ref 70–99)
POTASSIUM: 4.1 mmol/L (ref 3.5–5.1)
Sodium: 140 mmol/L (ref 135–145)

## 2018-03-10 MED ORDER — BISACODYL 5 MG PO TBEC: 5.0000 mg | DELAYED_RELEASE_TABLET | Freq: Every day | ORAL | Status: DC | PRN

## 2018-03-10 MED ORDER — FUROSEMIDE 10 MG/ML IJ SOLN
40.0000 mg | Freq: Once | INTRAMUSCULAR | Status: AC
  Administered 2018-03-10: 40 mg via INTRAVENOUS
  Filled 2018-03-10: qty 4

## 2018-03-10 MED ORDER — SENNOSIDES-DOCUSATE SODIUM 8.6-50 MG PO TABS
1.0000 | ORAL_TABLET | Freq: Two times a day (BID) | ORAL | Status: DC
  Administered 2018-03-10 – 2018-03-13 (×7): 1 via ORAL
  Filled 2018-03-10 (×7): qty 1

## 2018-03-10 MED ORDER — POLYETHYLENE GLYCOL 3350 17 G PO PACK
17.0000 g | PACK | Freq: Every day | ORAL | Status: DC
  Administered 2018-03-11 – 2018-03-13 (×3): 17 g via ORAL
  Filled 2018-03-10 (×2): qty 1

## 2018-03-10 NOTE — Progress Notes (Signed)
RT called to patient bedside by RN due to drop in SpO2. RN reported SAT 84-87. Patient complaining of nostrils being swollen and hurting, "hard to breathe through my nose". Patient declined Bipap again,. Placed on venti mask as an alternative to 5L [place] in an effort to alleviate irritation of swollen nostrils. RN aware. Will continue to monitor. Patient's current SAT is 96% on 6L Venti Mask. Patient resting comfortably in bed.

## 2018-03-10 NOTE — Progress Notes (Signed)
Date of Admission:  03/01/2018        Subjective: Says she is doing better Cough better Tried to participate in PT but back pain stopped her from sitting up  Medications:  . chlorhexidine  15 mL Mouth Rinse BID  . cholecalciferol  1,000 Units Oral Daily  . diclofenac sodium  2 g Topical QID  . diltiazem  240 mg Oral Daily  . ipratropium-albuterol  3 mL Nebulization Q6H  . magnesium oxide  400 mg Oral Daily  . mouth rinse  15 mL Mouth Rinse BID  . mouth rinse  15 mL Mouth Rinse q12n4p  . mirabegron ER  25 mg Oral Daily  . mometasone-formoterol  2 puff Inhalation BID  . pantoprazole  40 mg Oral Daily  . polyethylene glycol  17 g Oral Daily  . risperiDONE  0.25 mg Oral QHS  . senna-docusate  1 tablet Oral BID  . sodium chloride flush  10-40 mL Intracatheter Q12H    Objective: Vital signs in last 24 hours: Temp:  [97.4 F (36.3 C)-97.9 F (36.6 C)] 97.9 F (36.6 C) (11/29 1533) Pulse Rate:  [63-75] 71 (11/29 1533) Resp:  [19-20] 19 (11/29 1533) BP: (121-149)/(54-84) 121/62 (11/29 1533) SpO2:  [89 %-100 %] 96 % (11/29 1550) Weight:  [91.6 kg] 91.6 kg (11/29 0309)  PHYSICAL EXAM:  General: awake, legally blind,no resp distress Head: Normocephalic, without obvious abnormality, atraumatic. Eyes: blind ENT Nares normal. No drainage or sinus tenderness. Tongue coated, dry mouth Neck: Supple, symmetrical, no adenopathy, thyroid: non tender no carotid bruit and no JVD. Back: did not examine Lungs: b/l rhonchi, crepts Heart: s1s2 Abdomen: Soft, non-tender,not distended. Bowel sounds normal. No masses Extremities: atraumatic, no cyanosis. No edema. No clubbing Skin: bruising, rt arm PICC Lymph: Cervical, supraclavicular normal. Neurologic: Grossly non-focal  Lab Results Recent Labs    03/08/18 0437 03/09/18 0457 03/10/18 0510  WBC 12.4* 10.4  --   HGB 7.1* 8.2*  --   HCT 23.0* 26.4*  --   NA 141 143 140  K 4.4 4.4 4.1  CL 106 107 103  CO2 31 31 32  BUN '16  16 15  '$ CREATININE 0.65 0.55 0.58   Liver Panel No results for input(s): PROT, ALBUMIN, AST, ALT, ALKPHOS, BILITOT, BILIDIR, IBILI in the last 72 hours. Sedimentation Rate Recent Labs    03/08/18 0437  ESRSEDRATE 126*   C-Reactive Protein Recent Labs    03/08/18 0433  CRP 19.8*    Microbiology: MSSA 03/02/18 03/03/18 NG Studies/Results: No results found.   Assessment/Plan: MSSA bacteremia with  discitis/osteomyelitis T12-L1 with significant progressive destruction of the inferior endplate of N62 since 95/28/4132. 2. Possible left anterior epidural abscess at the L1 level, with presumed contiguous extension into left paraspinal phlegmon and left psoas abscess as evidence don the CT scan done on 03/05/18  She is going to need 6 - weeks of Iv cefazolin until 04/15/2018 TEE is not required as it will not change the management and also she has tenuous pulmonary status Repeat blood culture -11/22 BC Ng   AKI- resolved could be from NSAID/infection-  respiratory distress :COPD- exacerbation- fluid overload and also likely aspiration induced pneumonitis  due to her lying flat and eating  Severe lumbar pain will need brace as recommended by neurosurgeon   ESBL e.coli in the urine- likely contamination VS colonization- asymptomatic- UA has no WBC- will not treat.   H/o B/l TKA- no evidence of infection  Discussed the management with patient, her  husband and daughter. ID will sign off- call if needed  OPAT Orders Discharge antibiotics: Cefazolin 2 grams IV every 8 hours until 04/15/18 End Date:04/15/18  [facility] Care Per Protocol:  Labs weekly while on IV antibiotics: _x_ CBC with differential _x_ CMP _x_ CRP _x_ ESR   _x_ Please pull PIC at completion of IV antibiotics   Fax weekly labs to [phone]  Clinic Follow Up Appt:4 weeks-12/30   Call [phone] to make appt

## 2018-03-10 NOTE — Progress Notes (Signed)
Pt refusing to sit upright at meal times, family, significant other and patient educated on importance of proper posture at meal times to  Prevent aspiration and further deterioration in current health status. Pt still refuses and cannot tolerate proper posture for feedings, and beverage intake, husband and family continue to provide pt with water and food in position less than 45 degrees.

## 2018-03-10 NOTE — Progress Notes (Signed)
Physical Therapy Treatment Patient Details Name: Jaime [name] MRN: [id number] DOB: 1927/09/05 Today's Date: 03/10/2018    History of Present Illness presented to ER secondary to worsening back pain, LE weakness; admitted for management of back pain.  Hospital course significant for acute hypoxic respiratory failure requiring transfer to CCU and BiPAP (now weaned to 5L) and diagnosis of vertebral osteomyelitis/discitis/possible epidural abscess.    PT Comments    Patient agreeable to participate this session, even attempt transition towards edge of bed sitting with encouragement from therapist.  Tolerates scooting towards edge and sliding LEs over edge, but quickly resists efforts once trunk is engaged.  Maintains head/neck and trunk in fully extended position and does not tolerate flexion in any capacity due to pain. Did reposition patient in bed upon return to supine and placed in semi-chair position with HOB elevated to 40 degrees; reinforced importance of upright positioning for respiratory status, tolerance to upright and overall muscular strength/endurance.  Introduced various pain-management strategies during session (progressive relaxation, distraction).  Patient tolerated position x10 minutes with therapist in room; advised to continue for at least 15 minutes after end of session.  Patient/husband verbalized understanding of instruction/recommendation and agreed to 'trial' as tolerated.  RN informed/aware as well.   Follow Up Recommendations  SNF     Equipment Recommendations       Recommendations for Other Services       Precautions / Restrictions Precautions Precautions: Fall Precaution Comments: contact iso Spinal Brace: (patient unable to tolerate mobility necessary to faciltiate donning/doffing/use of lumbar corset) Restrictions Weight Bearing Restrictions: No    Mobility  Bed Mobility Overal bed mobility: Needs Assistance Bed Mobility: Supine to Sit      Supine to sit: Total assist     General bed mobility comments: slides LEs over edge of bed and tolerates placement of bilat UEs on bedrail; however with truncal elevation approx 25% normal range, patient consistently extending head/neck, resisting efforts and insisting on return to supine  Transfers                 General transfer comment: unable to tolerate due to pain  Ambulation/Gait             General Gait Details: unable to tolerate due to pain   Stairs             Wheelchair Mobility    Modified Rankin (Stroke Patients Only)       Balance                                            Cognition Arousal/Alertness: Lethargic Behavior During Therapy: Flat affect Overall Cognitive Status: Difficult to assess                                 General Comments: 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  Patient agreeable to trying, but unable/unwilling to tolerate despite extensive encouragement from therapist. Other Exercises: Did reposition patient in bed upon return to supine and placed in semi-chair position with HOB elevated to 40 degrees; reinforced importance of upright positioning for respiratory status, tolerance to upright and overall muscular strength/endurance.  Intrudoced various pain-management strategies during  session (progressive relaxation, distraction).  Patient tolerated position x10 minutes with therapist in room; advised to continue for at least 15 minutes after end of session.  Patient/husband verbalized understanding of instruction/recommendation and agreed to 'trial' as tolerated.  RN informed/aware as well.    General Comments        Pertinent Vitals/Pain Pain Assessment: Faces Faces Pain Scale: Hurts whole lot Pain Location: back Pain Descriptors / Indicators:  Aching;Grimacing;Guarding Pain Intervention(s): Monitored during session;Limited activity within patient's tolerance;Repositioned    Home Living                      Prior Function            PT Goals (current goals can now be found in the care plan section) Acute Rehab PT Goals Patient Stated Goal: to improve back pain PT Goal Formulation: With patient/family Time For Goal Achievement: 03/21/18 Potential to Achieve Goals: Fair Progress towards PT goals: Not progressing toward goals - comment(limited by pain, ability to tolerate/attempt mobility tasks)    Frequency    Min 2X/week      PT Plan Current plan remains appropriate    Co-evaluation              AM-PAC PT "6 Clicks" Mobility   Outcome Measure  Help needed turning from your back to your side while in a flat bed without using bedrails?: Total Help needed moving from lying on your back to sitting on the side of a flat bed without using bedrails?: Total Help needed moving to and from a bed to a chair (including a wheelchair)?: Total Help needed standing up from a chair using your arms (e.g., wheelchair or bedside chair)?: Total Help needed to walk in hospital room?: Total Help needed climbing 3-5 steps with a railing? : Total 6 Click Score: 6    End of Session   Activity Tolerance: Patient limited by pain Patient left: in bed;with call bell/phone within reach;with family/visitor present;with bed alarm set Nurse Communication: Mobility status PT Visit Diagnosis: Pain;Difficulty in walking, not elsewhere classified (R26.2);Muscle weakness (generalized) (M62.81);Repeated falls (R29.6)     Time: 1331-1400 PT Time Calculation (min) (ACUTE ONLY): 29 min  Charges:  $Therapeutic Activity: 23-37 mins                     Jaime [name] H. Manson Passey, PT, DPT, NCS 03/10/18, 4:01 PM [phone]

## 2018-03-10 NOTE — Progress Notes (Signed)
[facility] [place] at [facility]   PATIENT NAME: Jaime [name]    MR#:  [id number]  DATE OF BIRTH:  July 24, 1927  SUBJECTIVE: Since seen in ICU, off the BiPAP, appears slightly more comfortable with breathing but has back pain.  CHIEF COMPLAINT:   Chief Complaint  Patient presents with  . Back Pain  . Weakness  Patient seen and evaluated today Responds to verbal commands Awake On oxygen via nasal cannula at 5 L Oxygen saturation dropped Patient states her nostrils are swollen and she does not want to use the nasal cannula She declined BiPAP Patient put on Ventimask at 5 L and oxygen saturation improved to greater than 93% Complaints of constipation today Back pain better tolerated REVIEW OF SYSTEMS:  ROS  Review of Systems  Constitutional: Weakness HENT: Negative.   Eyes: Negative.   Respiratory: Chest congestion and shortness of breath Cardiovascular: Negative.   Gastrointestinal: Negative.   Genitourinary: Negative.   Musculoskeletal: Back pain Skin: Negative.   Neurological: Negative.   Endo/Heme/Allergies: Negative.   Psychiatric/Behavioral: Negative.   All other systems reviewed and are negative. DRUG ALLERGIES:   Allergies  Allergen Reactions  . Codeine Nausea And Vomiting    Pt states she can't take oxycodone.  She states she can take Percocet.  . Levofloxacin Other (See Comments)  . Oxycodone Nausea And Vomiting  . Prednisone     Patient states she had a stroke when she took prednisone before.  . Tramadol Hcl Anxiety    VITALS:  Blood pressure (!) 146/84, pulse 63, temperature 97.6 F (36.4 C), temperature source Oral, resp. rate 19, height 5\' 2"  (1.575 m), weight 91.6 kg, SpO2 94 %. PHYSICAL EXAMINATION:  GENERAL:  84 y.o.-year-old patient lying in the bed on oxygen via nasal cannula (6-8 liters now) EYES: Pupils equal, round, reactive to light . No scleral icterus. Extraocular muscles intact.  HEENT: Head atraumatic,  normocephalic. Oropharynx and nasopharynx clear.  NECK:  Supple, no jugular venous distention. No thyroid enlargement, no tenderness.  LUNGS: Bilateral coarse breath sounds present, basilar crepitus present. CARDIOVASCULAR: S1, S2 normal. No murmurs, rubs, or gallops.  ABDOMEN: Soft, nontender, nondistended. Bowel sounds present. No organomegaly or mass.  EXTREMITIES: No pedal edema NEUROLOGIC: Nonfocal. Legally Blind PSYCHIATRIC: The patient is lethargic SKIN: No obvious rash, lesion, or ulcer. LABORATORY PANEL:   CBC Recent Labs  Lab 03/09/18 0457  WBC 10.4  HGB 8.2*  HCT 26.4*  PLT 246   ------------------------------------------------------------------------------------------------------------------  Chemistries  Recent Labs  Lab 03/05/18 0556  03/10/18 0510  NA 141   < > 140  K 3.8   < > 4.1  CL 103   < > 103  CO2 31   < > 32  GLUCOSE 123*   < > 103*  BUN 26*   < > 15  CREATININE 0.58   < > 0.58  CALCIUM 7.5*   < > 8.3*  MG 2.0  --   --   AST 12*  --   --   ALT 7  --   --   ALKPHOS 65  --   --   BILITOT 1.1  --   --    < > = values in this interval not displayed.   ------------------------------------------------------------------------------------------------------------------  Cardiac Enzymes Recent Labs  Lab 03/04/18 0211  TROPONINI 0.03*   ------------------------------------------------------------------------------------------------------------------  RADIOLOGY:  No results found. EKG:   Orders placed or performed during the hospital encounter of 03/01/18  . ED EKG  .  ED EKG  . EKG 12-Lead  . EKG 12-Lead  . EKG 12-Lead  . EKG 12-Lead   ASSESSMENT AND PLAN:  84 year old female with past medical history of hypertension, previous history of spinal stenosis, CHF, compression fracture, osteoarthritis, chronic lymphedema of the lower extremities, chronic anemia who presents to the hospital due to back pain, difficulty ambulating.  * Acute  hypoxic respiratory failure: Declined BiPAP - Continue oxygen via Ventimask at 5 L -Wean oxygen as tolerated -Oxygen via nasal cannula caused irritation to the nostrils  *Fluid overload IV Lasix 40 mg daily Follow-up electrolytes  * Acute metabolic encephalopathy improved - reacts strongly to narcotics so try to avoid  * Anemia of chronic dz - Hb 8 after PRBC transfusion IV -Monitor hemoglobin hematocrit  * Vertebral Osteomyelitis/Discitis/Epidural Abscess? - will need 6 weeks of IV Abx, d/w Ortho Dr Ernest Pine - ID following - Neurosurgery recommends conservative mgmt - back brace  * Acute on Chronic back pain - Will try tylenolol (mild), toeradol (moderate) and morphine for severe pain as need -Avoid narcotics as much as possible  *MSSA bacteremia: PICC placed on 03/07/18 Cefazolin 2 grams IV every 8 hours until 04/15/18 per ID Infectious disease following Tolerating antibiotics okay  *ESBL E. coli UTI probably a contaminant per ID  *Left lung pneumonia versus edema On IV cefazolin antibiotic Agrees with Lasix for diuresis to improve lung compliance  * CHF- acute on chronic diastolic dysfunction: - follow I's and O's and daily weights.   -Continue Cardizem. -IV Lasix for diuresis  * COPD- no acute exacerbation.  Continue Dulera, albuterol inhaler as needed. - PRN duo nebs  * Essential hypertension-continue Cardizem. Hold coreg  * GERD-continue Protonix.  *Constipation Stool softeners continue   Patient has had recurrent hospitalizations for similar reasons over the past 6 months. Long-term prognosis poor.  Patient's husband wants everything done for now  Palliative care following as well  She is at high risk for Code Blue, cardio-resp arrest, multi-organ failure and death, if worsens, may need to transfer back to ICU  Please note patient is  hard of hearing and blind. This patient will be long stay here.  We may have to pursue rehab for her once clinically  stable  All the records are reviewed and case discussed with Care Management/Social Workerr. Management plans discussed with the patient, Cardio, nursing and they are in agreement.  CODE STATUS: Full code  TOTAL TIME TAKING CARE OF THIS PATIENT: 33 minutes.   More than 50% time spent in counseling, coordination of care     Ihor Austin M.D on 03/10/2018 at 10:53 AM  Between 7am to 6pm - Pager - [phone]  After 6pm go to [email or web address] - password [email or web address] [facility]  [facility] [facility]  Office  [phone]  CC: Primary care physician; Erasmo Downer, MD   Note: This dictation was prepared with Dragon dictation along with smaller phrase technology. Any transcriptional errors that result from this process are unintentional.

## 2018-03-10 NOTE — Clinical Social Work Note (Signed)
Clinical Social Work Assessment  Patient Details  Name: Jaime [name] MRN: [id number] Date of Birth: Apr 02, 1928  Date of referral:  03/10/18               Reason for consult:  Facility Placement                Permission sought to share information with:  Family Supports, Magazine features editoracility Contact Representative Permission granted to share information::  Yes, Verbal Permission Granted  Name::     Iris PertVandervelde,William Spouse [phone]  [phone] or Stuckey,[name] Daughter [phone]  [phone]   Agency::  SNF admissions  Relationship::     Contact Information:     Housing/Transportation Living arrangements for the past 2 months:  Single Family Home, Skilled Nursing Facility Source of Information:  Patient, Spouse Patient Interpreter Needed:  None Criminal Activity/Legal Involvement Pertinent to Current Situation/Hospitalization:  No - Comment as needed Significant Relationships:  Adult Children, Spouse Lives with:  Spouse Do you feel safe going back to the place where you live?  No Need for family participation in patient care:  Yes (Comment)  Care giving concerns: Patient and family feel that she needs some short term rehab before she is able to return back home.   Social Worker assessment / plan:  Patient is an 84 year old female who is alert and oriented x4.  Patient's husband was at bedside, patient states she has been to rehab in the past at [facility] and [facility], and she would prefer to return if possible.  CSW spoke to patient and family to explain role of CSW and process for looking for SNF placement.  Patient is familiar with process of going to SNF and what to expect.  Patient and husband gave CSW permission to begin bed search in [place].  Employment status:  Retired Database administratornsurance information:  Managed Medicare PT Recommendations:  Skilled Nursing Facility Information / Referral to community resources:  Skilled Nursing Facility  Patient/Family's  Response to care:  Patient and family are in agreement to going to SNF for short term rehab.  Patient/Family's Understanding of and Emotional Response to Diagnosis, Current Treatment, and Prognosis:  Patient and family are hopeful that she will not have to be in rehab very long.  Emotional Assessment Appearance:  Appears stated age Attitude/Demeanor/Rapport:    Affect (typically observed):  Appropriate, Stable, Pleasant Orientation:  Oriented to Self, Oriented to Place, Oriented to  Time, Oriented to Situation Alcohol / Substance use:  Not Applicable Psych involvement (Current and /or in the community):  No (Comment)  Discharge Needs  Concerns to be addressed:  Care Coordination Readmission within the last 30 days:  No Current discharge risk:  Lack of support system Barriers to Discharge:  Continued Medical Work up, [employer] Authorization   Darleene Cleavernterhaus, Nimai Burbach R, LCSWA 03/10/2018, 6:07 PM

## 2018-03-10 NOTE — Care Management (Addendum)
Patient was transferred to ICU 11/21 after code blue called. Patient moved out of icu 11/24.  She will will be discharging to a skilled nursing facility due to need for long term IV antibiotic for vertebral discitis / bacteremia.  Requiring 5 liters oxygen which is acute. Frequently using venti mask as nasal cannula is uncomfortable.  Has PICC She is not cooperative with sitting up during meals and afterwards for aspiration precaution. Still requiring IV lasix.  Palliative following.  Patient is full code. Attending does not anticipate discharge over the weekend.

## 2018-03-10 NOTE — Clinical Social Work Placement (Signed)
   CLINICAL SOCIAL WORK PLACEMENT  NOTE  Date:  03/10/2018  Patient Details  Name: Shaune Spittleatricia S Giovanelli MRN: [id number] Date of Birth: 06-Nov-1927  Clinical Social Work is seeking post-discharge placement for this patient at the Skilled  Nursing Facility level of care (*CSW will initial, date and re-position this form in  chart as items are completed):  Yes   Patient/family provided with [place] Clinical Social Work Department's list of facilities offering this level of care within the geographic area requested by the patient (or if unable, by the patient's family).  Yes   Patient/family informed of their freedom to choose among providers that offer the needed level of care, that participate in Medicare, Medicaid or managed care program needed by the patient, have an available bed and are willing to accept the patient.  Yes   Patient/family informed of [facility] ownership interest in [facility] and [facility], as well as of the fact that they are under no obligation to receive care at these facilities.  PASRR submitted to EDS on 03/10/18     PASRR number received on       Existing PASRR number confirmed on 03/10/18     FL2 transmitted to all facilities in geographic area requested by pt/family on 03/10/18     FL2 transmitted to all facilities within larger geographic area on       Patient informed that his/her managed care company has contracts with or will negotiate with certain facilities, including the following:        Yes   Patient/family informed of bed offers received.  [facility]     Physician recommends and patient chooses bed at      Patient to be [facility] on  .  Patient to be transferred to facility by       Patient family notified on   of transfer.  Name of family member notified:        PHYSICIAN Please sign FL2, Please prepare prescriptions, Please prepare  priority discharge summary, including medications     Additional Comment:    _______________________________________________ Darleene CleaverAnterhaus, Wilmarie Sparlin R, LCSWA 03/10/2018, 6:12 PM

## 2018-03-10 NOTE — Progress Notes (Signed)
Pt was off continuous pulsox, but was on 6 liter nasal cannula. Staff checked oxygen level and was  84-87. Pt was not in any distress but did complained of nose swollen and hurting. Oxygen was up to 7 liters and called respiratory.   Update 0114. Respiratory came and place pt on Venti Mask 5 liter [place] at sating at 96 %. Will continue to monitor.

## 2018-03-10 NOTE — Clinical Social Work Note (Signed)
CSW presented bed offers to patient and her husband, [facility].  CSW contacted [facility], they can accept patient once insurance authorization has been approved, and patient is medically ready for discharge.  [facility] will start insurance authorization on Monday since insurance company is closed today.  Ervin KnackEric R. Carmichael Burdette, MSW, Theresia MajorsLCSWA [phone]  03/10/2018 4:55 PM

## 2018-03-11 LAB — CBC
HCT: 26.7 % — ABNORMAL LOW (ref 36.0–46.0)
HEMOGLOBIN: 8.1 g/dL — AB (ref 12.0–15.0)
MCH: 30.2 pg (ref 26.0–34.0)
MCHC: 30.3 g/dL (ref 30.0–36.0)
MCV: 99.6 fL (ref 80.0–100.0)
Platelets: 188 10*3/uL (ref 150–400)
RBC: 2.68 MIL/uL — ABNORMAL LOW (ref 3.87–5.11)
RDW: 22.5 % — AB (ref 11.5–15.5)
WBC: 7.9 10*3/uL (ref 4.0–10.5)
nRBC: 0.6 % — ABNORMAL HIGH (ref 0.0–0.2)

## 2018-03-11 LAB — BPAM RBC
BLOOD PRODUCT EXPIRATION DATE: 201912112359
BLOOD PRODUCT EXPIRATION DATE: 201912132359
Blood Product Expiration Date: 201912192359
ISSUE DATE / TIME: 201911271722
Unit Type and Rh: 600
Unit Type and Rh: 600
Unit Type and Rh: 600

## 2018-03-11 LAB — BASIC METABOLIC PANEL
ANION GAP: 12 (ref 5–15)
BUN: 17 mg/dL (ref 8–23)
CALCIUM: 8.4 mg/dL — AB (ref 8.9–10.3)
CO2: 31 mmol/L (ref 22–32)
CREATININE: 0.54 mg/dL (ref 0.44–1.00)
Chloride: 100 mmol/L (ref 98–111)
GLUCOSE: 104 mg/dL — AB (ref 70–99)
POTASSIUM: 4 mmol/L (ref 3.5–5.1)
SODIUM: 143 mmol/L (ref 135–145)

## 2018-03-11 LAB — TYPE AND SCREEN
ABO/RH(D): A NEG
ANTIBODY SCREEN: NEGATIVE
Donor AG Type: NEGATIVE
UNIT DIVISION: 0
UNIT DIVISION: 0
Unit division: 0

## 2018-03-11 MED ORDER — POTASSIUM CHLORIDE CRYS ER 20 MEQ PO TBCR
20.0000 meq | EXTENDED_RELEASE_TABLET | Freq: Every day | ORAL | Status: DC
  Administered 2018-03-12 – 2018-03-13 (×2): 20 meq via ORAL
  Filled 2018-03-11 (×2): qty 1

## 2018-03-11 MED ORDER — FUROSEMIDE 40 MG PO TABS
40.0000 mg | ORAL_TABLET | Freq: Every day | ORAL | Status: DC
  Administered 2018-03-11 – 2018-03-13 (×3): 40 mg via ORAL
  Filled 2018-03-11 (×2): qty 1

## 2018-03-11 MED ORDER — IPRATROPIUM-ALBUTEROL 0.5-2.5 (3) MG/3ML IN SOLN
3.0000 mL | Freq: Three times a day (TID) | RESPIRATORY_TRACT | Status: DC
  Administered 2018-03-11 – 2018-03-13 (×6): 3 mL via RESPIRATORY_TRACT
  Filled 2018-03-11 (×6): qty 3

## 2018-03-11 MED ORDER — CEFAZOLIN IV (FOR PTA / DISCHARGE USE ONLY): 2.0000 g | Freq: Three times a day (TID) | INTRAVENOUS | 0 refills | Status: AC

## 2018-03-11 NOTE — Plan of Care (Signed)
  Problem: Activity: Goal: Risk for activity intolerance will decrease Outcome: Not Progressing   

## 2018-03-11 NOTE — Progress Notes (Signed)
PHARMACY CONSULT NOTE FOR:  OUTPATIENT  PARENTERAL ANTIBIOTIC THERAPY (OPAT)  Indication: MSSA Bacteremia Regimen: Cefazolin 2 gm IV Q8H End date: 04/15/2018  Weekly labs to include CBC/dif, CMP, ESR, CRP  Pull PICC at completion  IV antibiotic discharge orders are pended. To discharging provider:  please sign these orders via discharge navigator,  Select New Orders & click on the button choice - Manage This Unsigned Work.     Thank you for allowing pharmacy to be a part of this patient's care.  Laural Benes, Pharm.D., BCPS Clinical Pharmacist 03/11/2018, 11:51 AM

## 2018-03-11 NOTE — Progress Notes (Signed)
[facility] [place] at [facility]   PATIENT NAME: Jaime [name]    MR#:  [id number]  DATE OF BIRTH:  [birth year]  SUBJECTIVE: Since seen in ICU, off the BiPAP, appears slightly more comfortable with breathing but has back pain.  CHIEF COMPLAINT:   Chief Complaint  Patient presents with  . Back Pain  . Weakness  Patient seen and evaluated today Awake and responds to all verbal commands On oxygen via nasal cannula at 4 to 5 Liter Ventimask discontinued  had good bowel movement yesterday Back pain better tolerated REVIEW OF SYSTEMS:  ROS  Review of Systems  Constitutional: Weakness HENT: Negative.   Eyes: Negative.   Respiratory: Chest congestion and shortness of breath on and off Cardiovascular: Negative.   Gastrointestinal: Negative.   Genitourinary: Negative.   Musculoskeletal: Back pain Skin: Negative.   Neurological: Negative.   Endo/Heme/Allergies: Negative.   Psychiatric/Behavioral: Negative.   All other systems reviewed and are negative. DRUG ALLERGIES:   Allergies  Allergen Reactions  . Codeine Nausea And Vomiting    Pt states she can't take oxycodone.  She states she can take Percocet.  . Levofloxacin Other (See Comments)  . Oxycodone Nausea And Vomiting  . Prednisone     Patient states she had a stroke when she took prednisone before.  . Tramadol Hcl Anxiety    VITALS:  Blood pressure 135/71, pulse 95, temperature 98.4 F (36.9 C), temperature source Oral, resp. rate (!) 22, height 5\' 2"  (1.575 m), weight 89.5 kg, SpO2 96 %. PHYSICAL EXAMINATION:  GENERAL:  84 y.o.-year-old patient lying in the bed on oxygen via nasal cannula (6-8 liters now) EYES: Pupils equal, round, reactive to light . No scleral icterus. Extraocular muscles intact.  HEENT: Head atraumatic, normocephalic. Oropharynx and nasopharynx clear.  NECK:  Supple, no jugular venous distention. No thyroid enlargement, no tenderness.  LUNGS: Bilateral decreased  coarse breath sounds present, basilar crepitus present. CARDIOVASCULAR: S1, S2 normal. No murmurs, rubs, or gallops.  ABDOMEN: Soft, nontender, nondistended. Bowel sounds present. No organomegaly or mass.  EXTREMITIES: No pedal edema NEUROLOGIC: Nonfocal. Legally Blind PSYCHIATRIC: The patient is lethargic SKIN: No obvious rash, lesion, or ulcer. LABORATORY PANEL:   CBC Recent Labs  Lab 03/11/18 0630  WBC 7.9  HGB 8.1*  HCT 26.7*  PLT 188   ------------------------------------------------------------------------------------------------------------------  Chemistries  Recent Labs  Lab 03/05/18 0556  03/11/18 0630  NA 141   < > 143  K 3.8   < > 4.0  CL 103   < > 100  CO2 31   < > 31  GLUCOSE 123*   < > 104*  BUN 26*   < > 17  CREATININE 0.58   < > 0.54  CALCIUM 7.5*   < > 8.4*  MG 2.0  --   --   AST 12*  --   --   ALT 7  --   --   ALKPHOS 65  --   --   BILITOT 1.1  --   --    < > = values in this interval not displayed.   ------------------------------------------------------------------------------------------------------------------  Cardiac Enzymes No results for input(s): TROPONINI in the last 168 hours. ------------------------------------------------------------------------------------------------------------------  RADIOLOGY:  No results found. EKG:   Orders placed or performed during the hospital encounter of 03/01/18  . ED EKG  . ED EKG  . EKG 12-Lead  . EKG 12-Lead  . EKG 12-Lead  . EKG 12-Lead   ASSESSMENT AND PLAN:  84 year old  female with past medical history of hypertension, previous history of spinal stenosis, CHF, compression fracture, osteoarthritis, chronic lymphedema of the lower extremities, chronic anemia who presents to the hospital due to back pain, difficulty ambulating.  * Acute on chronic hypoxic respiratory failure:  - Continue oxygen via nasal cannula at 4 to 5 L -Wean oxygen as tolerated  *Fluid overload Switch to oral Lasix  40 mg daily Daily potassium supplementation Follow-up electrolytes  * Acute metabolic encephalopathy improved - reacts strongly to narcotics so try to avoid  * Anemia of chronic dz - Hb 8 after PRBC transfusion IV and stable -Monitor hemoglobin hematocrit  * Vertebral Osteomyelitis/Discitis/Epidural Abscess? - will need 6 weeks of IV Abx, d/w Ortho Dr Ernest Pine - ID follow up appreciated - Neurosurgery recommends conservative mgmt - back brace  * Acute on Chronic back pain - Will try tylenolol (mild), toeradol (moderate) and morphine for severe pain as need -Avoid narcotics as much as possible  *MSSA bacteremia: PICC placed on 03/07/18 Cefazolin 2 grams IV every 8 hours until 04/15/18 per ID Infectious disease following Tolerating antibiotics okay  *ESBL E. coli UTI probably a contaminant per ID  *Pulmonary edema On IV cefazolin antibiotic Agree with Lasix for diuresis to improve lung compliance  * CHF- acute on chronic diastolic dysfunction improving - follow I's and O's and daily weights.   -Continue Cardizem. -Lasix to continue for diuresis  * COPD- no acute exacerbation.  Continue Dulera, albuterol inhaler as needed. - PRN duo nebs  * Essential hypertension-continue Cardizem. Hold coreg  * GERD-continue Protonix.  *Constipation Stool softeners continue  *Social worker follow-up for placement   Patient has had recurrent hospitalizations for similar reasons over the past 6 months. Long-term prognosis poor.  Patient's husband wants everything done for now  Palliative care following as well  She is at high risk for Code Blue, cardio-resp arrest, multi-organ failure and death, if worsens, may need to transfer back to ICU  Please note patient is  hard of hearing and blind. This patient will be long stay here.  We may have to pursue rehab for her once clinically stable  All the records are reviewed and case discussed with Care Management/Social  Workerr. Management plans discussed with the patient, Cardio, nursing and they are in agreement.  CODE STATUS: Full code  TOTAL TIME TAKING CARE OF THIS PATIENT: 32 minutes.   More than 50% time spent in counseling, coordination of care     Ihor Austin M.D on 03/11/2018 at 10:20 AM  Between 7am to 6pm - Pager - [phone]  After 6pm go to [email or web address] - password [email or web address] [facility]  [facility]  Office  [phone]  CC: Primary care physician; Erasmo Downer, MD   Note: This dictation was prepared with Dragon dictation along with smaller phrase technology. Any transcriptional errors that result from this process are unintentional.

## 2018-03-12 ENCOUNTER — Inpatient Hospital Stay: Payer: Medicare Other

## 2018-03-12 NOTE — Progress Notes (Signed)
[facility] - [place] at [facility]   PATIENT NAME: Jaime [name]    MR#:  [id number]  DATE OF BIRTH:  April 25, 1927  SUBJECTIVE: Since seen in ICU, off the BiPAP, appears slightly more comfortable with breathing but has back pain.  CHIEF COMPLAINT:   Chief Complaint  Patient presents with  . Back Pain  . Weakness  Patient seen and evaluated today Awake and responds to all verbal commands On oxygen via nasal cannula at 4 liter had good bowel movement yesterday Back pain better tolerated Has nausea REVIEW OF SYSTEMS:  ROS  Review of Systems  Constitutional: Weakness HENT: Negative.   Eyes: Negative.   Respiratory: Chest congestion and shortness of breath better Cardiovascular: Negative.   Gastrointestinal: Negative.   Genitourinary: Negative.   Musculoskeletal: Back pain Skin: Negative.   Neurological: Negative.   Endo/Heme/Allergies: Negative.   Psychiatric/Behavioral: Negative.   All other systems reviewed and are negative. DRUG ALLERGIES:   Allergies  Allergen Reactions  . Codeine Nausea And Vomiting    Pt states she can't take oxycodone.  She states she can take Percocet.  . Levofloxacin Other (See Comments)  . Oxycodone Nausea And Vomiting  . Prednisone     Patient states she had a stroke when she took prednisone before.  . Tramadol Hcl Anxiety    VITALS:  Blood pressure (!) 138/57, pulse 85, temperature 98 F (36.7 C), temperature source Oral, resp. rate 18, height 5\' 2"  (1.575 m), weight 89.4 kg, SpO2 95 %. PHYSICAL EXAMINATION:  GENERAL:  84 y.o.-year-old patient lying in the bed on oxygen via nasal cannula (6-8 liters now) EYES: Pupils equal, round, reactive to light . No scleral icterus. Extraocular muscles intact.  HEENT: Head atraumatic, normocephalic. Oropharynx and nasopharynx clear.  NECK:  Supple, no jugular venous distention. No thyroid enlargement, no tenderness.  LUNGS: Bilateral decreased coarse breath sounds  present, basilar crepitus decreased. CARDIOVASCULAR: S1, S2 normal. No murmurs, rubs, or gallops.  ABDOMEN: Soft, nontender, nondistended. Bowel sounds present. No organomegaly or mass.  EXTREMITIES: No pedal edema NEUROLOGIC: Nonfocal. Legally Blind PSYCHIATRIC: The patient is lethargic SKIN: No obvious rash, lesion, or ulcer. LABORATORY PANEL:   CBC Recent Labs  Lab 03/11/18 0630  WBC 7.9  HGB 8.1*  HCT 26.7*  PLT 188   ------------------------------------------------------------------------------------------------------------------  Chemistries  Recent Labs  Lab 03/11/18 0630  NA 143  K 4.0  CL 100  CO2 31  GLUCOSE 104*  BUN 17  CREATININE 0.54  CALCIUM 8.4*   ------------------------------------------------------------------------------------------------------------------  Cardiac Enzymes No results for input(s): TROPONINI in the last 168 hours. ------------------------------------------------------------------------------------------------------------------  RADIOLOGY:  No results found. EKG:   Orders placed or performed during the hospital encounter of 03/01/18  . ED EKG  . ED EKG  . EKG 12-Lead  . EKG 12-Lead  . EKG 12-Lead  . EKG 12-Lead   ASSESSMENT AND PLAN:  84 year old female with past medical history of hypertension, previous history of spinal stenosis, CHF, compression fracture, osteoarthritis, chronic lymphedema of the lower extremities, chronic anemia who presents to the hospital due to back pain, difficulty ambulating.  * Acute on chronic hypoxic respiratory failure:  - Continue oxygen via nasal cannula at 4 liter -Wean oxygen as tolerated  *Fluid overload Switch to oral Lasix 40 mg daily Daily potassium supplementation Follow-up electrolytes Chest x-ray to evaluate for volume overload and congestion  * Acute metabolic encephalopathy improved - reacts strongly to narcotics so try to avoid  * Anemia of chronic dz - Hb  8 after PRBC  transfusion IV and stable -Monitor hemoglobin hematocrit  * Vertebral Osteomyelitis/Discitis/Epidural Abscess? - will need 6 weeks of IV Abx, d/w Ortho Dr Ernest PineHooten - ID follow up appreciated - Neurosurgery recommends conservative mgmt - back brace  * Acute on Chronic back pain - Will try tylenolol (mild), toeradol (moderate) and morphine for severe pain as need -Avoid narcotics as much as possible  *MSSA bacteremia: PICC placed on 03/07/18 Cefazolin 2 grams IV every 8 hours until 04/15/18 per ID Infectious disease following Tolerating antibiotics okay  *ESBL E. coli UTI probably a contaminant per ID  *Pulmonary edema On IV cefazolin antibiotic Agree with Lasix for diuresis to improve lung compliance  * CHF- acute on chronic diastolic dysfunction improving - follow I's and O's and daily weights.   -Continue Cardizem. -Lasix to continue for diuresis  * COPD- no acute exacerbation.  Continue Dulera, albuterol inhaler as needed. - PRN duo nebs  * Essential hypertension-continue Cardizem. Hold coreg  * GERD-continue Protonix.  *Constipation Stool softeners continue  *Ambulatory dysfunction Physical therapy follow-up  *Social worker follow-up for placement   Patient has had recurrent hospitalizations for similar reasons over the past 6 months. Long-term prognosis poor.  Patient's husband wants everything done for now  Palliative care following as well  She is at high risk for Code Blue, cardio-resp arrest, multi-organ failure and death, if worsens, may need to transfer back to ICU  Please note patient is  hard of hearing and blind. This patient will be long stay here.  We may have to pursue rehab for her once clinically stable  All the records are reviewed and case discussed with Care Management/Social Workerr. Management plans discussed with the patient, Cardio, nursing and they are in agreement.  CODE STATUS: Full code  TOTAL TIME TAKING CARE OF THIS PATIENT:  32 minutes.   More than 50% time spent in counseling, coordination of care     Ihor AustinPavan Pyreddy M.D on 03/12/2018 at 11:37 AM  Between 7am to 6pm - Pager - [phone]  After 6pm go to [email or web address] - password [email or web address] [email or web address]  [facility]  Office  [phone]  CC: Primary care physician; [name], Angela M, MD   Note: This dictation was prepared with Dragon dictation along with smaller phrase technology. Any transcriptional errors that result from this process are unintentional.

## 2018-03-12 NOTE — Plan of Care (Signed)
  Problem: Activity: Goal: Risk for activity intolerance will decrease Outcome: Not Progressing   

## 2018-03-13 ENCOUNTER — Ambulatory Visit: Payer: Medicare Other | Admitting: Family

## 2018-03-13 LAB — GLUCOSE, CAPILLARY: Glucose-Capillary: 111 mg/dL — ABNORMAL HIGH (ref 70–99)

## 2018-03-13 MED ORDER — IBUPROFEN 600 MG PO TABS: 600.0000 mg | ORAL_TABLET | Freq: Four times a day (QID) | ORAL | 0 refills | Status: AC | PRN

## 2018-03-13 MED ORDER — FUROSEMIDE 40 MG PO TABS: 40.0000 mg | ORAL_TABLET | Freq: Every day | ORAL | Status: DC

## 2018-03-13 NOTE — Progress Notes (Signed)
SATURATION QUALIFICATIONS: (This note is used to comply with regulatory documentation for home oxygen)  Patient Saturations on Room Air at Rest = 86%  Patient Saturations on Room Air while Ambulating = n/a%  Patient Saturations on  Liters of oxygen while Ambulating = n/a%  Please briefly explain why patient needs home oxygen: patient's oxygen saturation on room air while at rest was 86%. Went up to 97% on 2L O2.

## 2018-03-13 NOTE — Progress Notes (Signed)
Occupational Therapy Treatment Patient Details Name: ANYLAH SCHEIB MRN: [id number] DOB: 11-08-1927 Today's Date: 03/13/2018    History of present illness presented to ER secondary to worsening back pain, LE weakness; admitted for management of back pain.  Hospital course significant for acute hypoxic respiratory failure requiring transfer to CCU and BiPAP (now weaned to 5L) and diagnosis of vertebral osteomyelitis/discitis/possible epidural abscess.   OT comments   Pt sat up in bed about 40 degrees to complete oral swab wtih mouth wash since she had already brushed her teeth and husband reported that she did it herself after set up.  She was able to put bilateral hearing aids in with min assist and cues and wash/dry face and hands after set up with min cues cues only and brush her hair with min cues.  Unable to reach back of head fully due to pain with lifting head. Pt is now on 2L of O2 nasal cannula and c/o nasal stuffiness.  O2 sats were 93-97% during and after activity.  Min assist and mod cues for rolling side to side and holding for 5 seconds for husband to rub pain lotion on her back.  Worked on deep breathing exercises to help with pain managment and relaxation when pain increases.  Started discussion about energy Therapist, occupational and pursed lip breathing for ADLs.  Husband present and active participant in session.  Follow Up Recommendations  SNF    Equipment Recommendations       Recommendations for Other Services      Precautions / Restrictions Precautions Precautions: Fall Precaution Comments: contact iso Required Braces or Orthoses: (talked to [facility] about using an abdominal binder instead for pain control) Restrictions Weight Bearing Restrictions: No Other Position/Activity Restrictions: lumbar corsett was rec for pain control only and does not have one yet--rec use of abdominal binder instead since this would provide compression and possibly better pain control.  Also  talked to [place] from PT.       Mobility Bed Mobility                  Transfers                      Balance                                           ADL either performed or assessed with clinical judgement   ADL Overall ADL's : Needs assistance/impaired Eating/Feeding: Set up;Moderate assistance;Bed level Eating/Feeding Details (indicate cue type and reason): strongly rec that pt sit up higher in bed but is limited to about 40 degrees due to lumbar pain.  Husband has been feeding her. Grooming: Wash/dry hands;Wash/dry face;Oral care;Brushing hair;Set up;Minimal assistance Grooming Details (indicate cue type and reason): Pt sat up in bed about 40 degrees to complete oral swab wtih mouth wash since she had already brushed her teeth and husband reported that she did it herself after set up.  She was able to put bilateral hearing aids in with min assist and cues and wash/dry face and hands after set up with min cues cues only and brush her hair with min cues.  Unable to reach back of head fully due to pain with lifting head.  General ADL Comments: Pt is now on 2L of O2 nasal cannula and c/o nasal stuffiness.  O2 sats were 93-97% during and after activity.  Min assist and mod cues for rolling side to side and holding for 5 seconds for husband to rub pain lotion on her back.  Worked on deep breathing exercises to help with pain managment and relaxation when pain increases.  Started discussion about energy Therapist, occupational and pursed lip breathing for ADLs.  Husband present and active participant in session.     Vision Baseline Vision/History: Legally blind     Perception     Praxis      Cognition Arousal/Alertness: Awake/alert Behavior During Therapy: Flat affect Overall Cognitive Status: Within Functional Limits for tasks assessed                                 General Comments: Pt is blind and  hears better in L ear        Exercises     Shoulder Instructions       General Comments      Pertinent Vitals/ Pain       Pain Assessment: Faces Faces Pain Scale: Hurts whole lot Pain Location: back Pain Descriptors / Indicators: Aching;Grimacing;Guarding Pain Intervention(s): Limited activity within patient's tolerance;Monitored during session;Repositioned;Utilized relaxation techniques;Other (comment)(husband applied pain lotion bilatrerally)  Home Living                                          Prior Functioning/Environment              Frequency           Progress Toward Goals  OT Goals(current goals can now be found in the care plan section)  Progress towards OT goals: Progressing toward goals  Acute Rehab OT Goals Patient Stated Goal: to improve back pain OT Goal Formulation: With patient/family Time For Goal Achievement: 03/22/18 Potential to Achieve Goals: Fair  Plan Discharge plan remains appropriate    Co-evaluation                 AM-PAC OT "6 Clicks" Daily Activity     Outcome Measure   Help from another person eating meals?: A Lot Help from another person taking care of personal grooming?: A Little Help from another person toileting, which includes using toliet, bedpan, or urinal?: Total Help from another person bathing (including washing, rinsing, drying)?: A Lot Help from another person to put on and taking off regular upper body clothing?: A Lot Help from another person to put on and taking off regular lower body clothing?: Total 6 Click Score: 11    End of Session    OT Visit Diagnosis: Pain;Muscle weakness (generalized) (M62.81);Low vision, both eyes (H54.2);Other abnormalities of gait and mobility (R26.89) Pain - part of body: (bilateral lower and mid back)   Activity Tolerance Patient limited by pain   Patient Left in bed;with call bell/phone within reach;with family/visitor present   Nurse  Communication Other (comment)(talked to [facility] about rec to use abdominal binder and PT to assess at next session today as well)        Time: 9629-5284 OT Time Calculation (min): 30 min  Charges: OT General Charges $OT Visit: 1 Visit OT Treatments $Self Care/Home Management : 23-37 mins  Susanne Borders, OTR/L ascom [phone] 03/13/18,  10:50 AM

## 2018-03-13 NOTE — Clinical Social Work Note (Signed)
CSW received a phone call from [facility], and they have received insurance authorization for patient to go to SNF today.  CSW updated patient and her family
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
## 2018-03-13 NOTE — Care Management Important Message (Signed)
Important Message  Patient Details  Name: Shaune Spittleatricia S Low MRN: [id number] Date of Birth: [birth year]   Medicare Important Message Given:  Yes    Sherren KernsJennifer L Adit Riddles, RN 03/13/2018, 3:29 PM

## 2018-03-13 NOTE — Discharge Instructions (Signed)
Bacteremia °Bacteremia is the presence of bacteria in the blood. When bacteria enter the bloodstream, they can cause a life-threatening reaction called sepsis, which is a medical emergency. Bacteremia can spread to other parts of the body, including the heart, joints, and brain. °What are the causes? °This condition is caused by bacteria that get into the blood. Bacteria can enter the blood: °· From a skin infection or a cut on your skin. °· During an episode of pneumonia. °· From an infection in your stomach or intestine (gastrointestinal infection). °· From an infection in your bladder or urinary system (urinary tract infection). °· During a dental or medical procedure. °· After you brush your teeth so hard that your gums bleed. °· When a bacterial infection in another part of the body spreads to the blood. °· Through a dirty needle. ° °What increases the risk? °This condition is more likely to develop in: °· Children. °· Elderly adults. °· People who have a long-lasting (chronic) disease or medical condition. °· People who have an artificial joint or heart valve. °· People who have heart valve disease. °· People who have a tube, such as a catheter or IV tube, that has been inserted for a medical treatment. °· People who have a weak body defense system (immune system). °· People who use IV drugs. ° °What are the signs or symptoms? °Symptoms of this condition include: °· Fever. °· Chills. °· A racing heart. °· Shortness of breath. °· Dizziness. °· Weakness. °· Confusion. °· Nausea or vomiting. °· Diarrhea. ° °In some cases, there are no symptoms. Bacteremia that has spread to the other parts of the body may cause symptoms in those areas. °How is this diagnosed? °This condition may be diagnosed with a physical exam and tests, such as: °· A complete blood count (CBC). This test looks for signs of infection. °· Blood cultures. These look for bacteria in your blood. °· Tests of any tubes that you may have inserted into  your body, such as an IV tube or urinary catheter. These tests look for a source of infection. °· Urine tests including urine cultures. These look for bacteria in the urine that could be a source of infection. °· Imaging tests, such as an X-ray, CT scan, MRI, or heart ultrasound. These look for a source of infection in other parts of the body, such as the lungs, heart valves, or joints. ° °How is this treated? °This condition may be treated with: °· Antibiotic medicines given through an IV infusion. Depending on the source of infection, antibiotics may be needed for several weeks. At first, an antibiotic may be given to kill most types of blood bacteria. If your test results show that a certain kind of bacteria is causing problems, the antibiotic may be changed to kill only the bacteria that are causing problems. °· Antibiotics taken by mouth. °· IV fluids to support the body as you fight the infection. °· Removing any catheter or device that could be a source of infection. °· Blood pressure and breathing support, if you have sepsis. °· Surgery to control the source or spread of infection, such as: °? Removing an infected implanted device. °? Removing infected tissue or an abscess. ° °This condition is usually treated at a hospital. If you are treated at home, you may need to come back for medicines, blood tests, and evaluation. This is important. °Follow these instructions at home: °· Take over-the-counter and prescription medicines only as told by your health care provider. °·   If you were prescribed an antibiotic, take it as told by your health care provider. Do not stop taking the antibiotic even if you start to feel better. °· Rest until your condition is under control. °· Drink enough fluid to keep your urine clear or pale yellow. °· Do not smoke. If you need help quitting, ask your health care provider. °· Keep all follow-up visits as told by your health care provider. This is important. °How is this  prevented? °· Get the vaccinations that your health care provider recommends. °· Clean and cover any scrapes or cuts. °· Take good care of your skin. This includes regular bathing and moisturizing. °· Wash your hands often. °· Practice good oral hygiene. Brush your teeth two times a day and floss regularly. °Get help right away if: °· You have pain. °· You have a fever. °· You have trouble breathing. °· Your skin becomes blotchy, pale, or clammy. °· You develop confusion, dizziness, or weakness. °· You develop diarrhea. °· You develop any new symptoms after treatment. °Summary °· Bacteremia is the presence of bacteria in the blood. When bacteria enter the bloodstream, they can cause a life- threatening reaction called sepsis. °· Children and elderly adults are at increased risk of bacteremia. Other risk factors include having a long-lasting (chronic) disease or a weak immune system, having an artificial joint or heart valve, having heart valve disease, having tubes that were inserted in the body for medical treatment, or using IV drugs. °· Some symptoms of bacteremia include fever, chills, shortness of breath, confusion, nausea or vomiting, and diarrhea. °· Tests may be done to diagnose a source of infection that led to bacteremia. These tests may include blood tests, urine tests, and imaging tests. °· Bacteremia is usually treated with antibiotics, usually in a hospital. °This information is not intended to replace advice given to you by your health care provider. Make sure you discuss any questions you have with your health care provider. °Document Released: 01/10/2006 Document Revised: 02/24/2016 Document Reviewed: 02/24/2016 °Elsevier Interactive Patient Education © 2018 [employer] ° °

## 2018-03-13 NOTE — Plan of Care (Signed)

## 2018-03-13 NOTE — Progress Notes (Signed)
Patient transported via EMS. 

## 2018-03-13 NOTE — Progress Notes (Signed)
Report given to [place] at [facility]. EMS called for transport. No questions or concerns by patient or family. Awaiting for EMS.

## 2018-03-14 ENCOUNTER — Ambulatory Visit: Payer: Medicare Other | Admitting: Nurse Practitioner

## 2018-03-17 ENCOUNTER — Ambulatory Visit: Payer: Self-pay | Admitting: Family Medicine

## 2018-03-24 ENCOUNTER — Ambulatory Visit: Payer: Medicare Other | Admitting: Family

## 2018-04-01 ENCOUNTER — Emergency Department: Payer: Medicare Other

## 2018-04-01 ENCOUNTER — Inpatient Hospital Stay
Admission: EM | Admit: 2018-04-01 | Discharge: 2018-04-12 | DRG: 291 | Disposition: E | Payer: Medicare Other | Attending: Family Medicine | Admitting: Family Medicine

## 2018-04-01 ENCOUNTER — Other Ambulatory Visit: Payer: Self-pay

## 2018-04-01 DIAGNOSIS — Z794 Long term (current) use of insulin: Secondary | ICD-10-CM

## 2018-04-01 DIAGNOSIS — A4901 Methicillin susceptible Staphylococcus aureus infection, unspecified site: Secondary | ICD-10-CM | POA: Diagnosis present

## 2018-04-01 DIAGNOSIS — Z885 Allergy status to narcotic agent status: Secondary | ICD-10-CM

## 2018-04-01 DIAGNOSIS — R402253 Coma scale, best verbal response, oriented, at hospital admission: Secondary | ICD-10-CM | POA: Diagnosis present

## 2018-04-01 DIAGNOSIS — K5903 Drug induced constipation: Secondary | ICD-10-CM | POA: Diagnosis present

## 2018-04-01 DIAGNOSIS — K219 Gastro-esophageal reflux disease without esophagitis: Secondary | ICD-10-CM | POA: Diagnosis present

## 2018-04-01 DIAGNOSIS — M4626 Osteomyelitis of vertebra, lumbar region: Secondary | ICD-10-CM | POA: Diagnosis present

## 2018-04-01 DIAGNOSIS — R402143 Coma scale, eyes open, spontaneous, at hospital admission: Secondary | ICD-10-CM | POA: Diagnosis present

## 2018-04-01 DIAGNOSIS — D509 Iron deficiency anemia, unspecified: Secondary | ICD-10-CM | POA: Diagnosis present

## 2018-04-01 DIAGNOSIS — Z792 Long term (current) use of antibiotics: Secondary | ICD-10-CM

## 2018-04-01 DIAGNOSIS — Z888 Allergy status to other drugs, medicaments and biological substances status: Secondary | ICD-10-CM

## 2018-04-01 DIAGNOSIS — M4646 Discitis, unspecified, lumbar region: Secondary | ICD-10-CM | POA: Diagnosis present

## 2018-04-01 DIAGNOSIS — J9690 Respiratory failure, unspecified, unspecified whether with hypoxia or hypercapnia: Secondary | ICD-10-CM | POA: Diagnosis present

## 2018-04-01 DIAGNOSIS — Z515 Encounter for palliative care: Secondary | ICD-10-CM | POA: Diagnosis not present

## 2018-04-01 DIAGNOSIS — I480 Paroxysmal atrial fibrillation: Secondary | ICD-10-CM | POA: Diagnosis present

## 2018-04-01 DIAGNOSIS — H547 Unspecified visual loss: Secondary | ICD-10-CM | POA: Diagnosis present

## 2018-04-01 DIAGNOSIS — I361 Nonrheumatic tricuspid (valve) insufficiency: Secondary | ICD-10-CM | POA: Diagnosis not present

## 2018-04-01 DIAGNOSIS — M4644 Discitis, unspecified, thoracic region: Secondary | ICD-10-CM | POA: Diagnosis present

## 2018-04-01 DIAGNOSIS — J810 Acute pulmonary edema: Secondary | ICD-10-CM

## 2018-04-01 DIAGNOSIS — Z7982 Long term (current) use of aspirin: Secondary | ICD-10-CM

## 2018-04-01 DIAGNOSIS — J9621 Acute and chronic respiratory failure with hypoxia: Secondary | ICD-10-CM | POA: Diagnosis present

## 2018-04-01 DIAGNOSIS — H9190 Unspecified hearing loss, unspecified ear: Secondary | ICD-10-CM | POA: Diagnosis present

## 2018-04-01 DIAGNOSIS — Z8661 Personal history of infections of the central nervous system: Secondary | ICD-10-CM

## 2018-04-01 DIAGNOSIS — K449 Diaphragmatic hernia without obstruction or gangrene: Secondary | ICD-10-CM | POA: Diagnosis present

## 2018-04-01 DIAGNOSIS — G894 Chronic pain syndrome: Secondary | ICD-10-CM | POA: Diagnosis present

## 2018-04-01 DIAGNOSIS — J9622 Acute and chronic respiratory failure with hypercapnia: Secondary | ICD-10-CM | POA: Diagnosis present

## 2018-04-01 DIAGNOSIS — T402X5A Adverse effect of other opioids, initial encounter: Secondary | ICD-10-CM | POA: Diagnosis present

## 2018-04-01 DIAGNOSIS — R52 Pain, unspecified: Secondary | ICD-10-CM

## 2018-04-01 DIAGNOSIS — I890 Lymphedema, not elsewhere classified: Secondary | ICD-10-CM | POA: Diagnosis present

## 2018-04-01 DIAGNOSIS — Z87820 Personal history of traumatic brain injury: Secondary | ICD-10-CM

## 2018-04-01 DIAGNOSIS — M1990 Unspecified osteoarthritis, unspecified site: Secondary | ICD-10-CM | POA: Diagnosis present

## 2018-04-01 DIAGNOSIS — Z8614 Personal history of Methicillin resistant Staphylococcus aureus infection: Secondary | ICD-10-CM

## 2018-04-01 DIAGNOSIS — Z96651 Presence of right artificial knee joint: Secondary | ICD-10-CM | POA: Diagnosis present

## 2018-04-01 DIAGNOSIS — Z6835 Body mass index (BMI) 35.0-35.9, adult: Secondary | ICD-10-CM

## 2018-04-01 DIAGNOSIS — E874 Mixed disorder of acid-base balance: Secondary | ICD-10-CM | POA: Diagnosis present

## 2018-04-01 DIAGNOSIS — I493 Ventricular premature depolarization: Secondary | ICD-10-CM | POA: Diagnosis present

## 2018-04-01 DIAGNOSIS — I5033 Acute on chronic diastolic (congestive) heart failure: Secondary | ICD-10-CM | POA: Diagnosis present

## 2018-04-01 DIAGNOSIS — Z79899 Other long term (current) drug therapy: Secondary | ICD-10-CM

## 2018-04-01 DIAGNOSIS — G9341 Metabolic encephalopathy: Secondary | ICD-10-CM | POA: Diagnosis present

## 2018-04-01 DIAGNOSIS — M4624 Osteomyelitis of vertebra, thoracic region: Secondary | ICD-10-CM | POA: Diagnosis present

## 2018-04-01 DIAGNOSIS — Z66 Do not resuscitate: Secondary | ICD-10-CM | POA: Diagnosis present

## 2018-04-01 DIAGNOSIS — I272 Pulmonary hypertension, unspecified: Secondary | ICD-10-CM | POA: Diagnosis present

## 2018-04-01 DIAGNOSIS — R54 Age-related physical debility: Secondary | ICD-10-CM | POA: Diagnosis not present

## 2018-04-01 DIAGNOSIS — J440 Chronic obstructive pulmonary disease with acute lower respiratory infection: Secondary | ICD-10-CM | POA: Diagnosis present

## 2018-04-01 DIAGNOSIS — J189 Pneumonia, unspecified organism: Secondary | ICD-10-CM | POA: Diagnosis present

## 2018-04-01 DIAGNOSIS — Z959 Presence of cardiac and vascular implant and graft, unspecified: Secondary | ICD-10-CM

## 2018-04-01 DIAGNOSIS — E669 Obesity, unspecified: Secondary | ICD-10-CM | POA: Diagnosis present

## 2018-04-01 DIAGNOSIS — M4800 Spinal stenosis, site unspecified: Secondary | ICD-10-CM | POA: Diagnosis present

## 2018-04-01 DIAGNOSIS — J918 Pleural effusion in other conditions classified elsewhere: Secondary | ICD-10-CM | POA: Diagnosis present

## 2018-04-01 DIAGNOSIS — K5900 Constipation, unspecified: Secondary | ICD-10-CM

## 2018-04-01 DIAGNOSIS — J9601 Acute respiratory failure with hypoxia: Secondary | ICD-10-CM | POA: Diagnosis not present

## 2018-04-01 DIAGNOSIS — Z7189 Other specified counseling: Secondary | ICD-10-CM

## 2018-04-01 DIAGNOSIS — I110 Hypertensive heart disease with heart failure: Secondary | ICD-10-CM | POA: Diagnosis present

## 2018-04-01 DIAGNOSIS — D638 Anemia in other chronic diseases classified elsewhere: Secondary | ICD-10-CM | POA: Diagnosis present

## 2018-04-01 DIAGNOSIS — I340 Nonrheumatic mitral (valve) insufficiency: Secondary | ICD-10-CM | POA: Diagnosis not present

## 2018-04-01 DIAGNOSIS — Y95 Nosocomial condition: Secondary | ICD-10-CM | POA: Diagnosis present

## 2018-04-01 DIAGNOSIS — M4620 Osteomyelitis of vertebra, site unspecified: Secondary | ICD-10-CM | POA: Diagnosis present

## 2018-04-01 DIAGNOSIS — K5909 Other constipation: Secondary | ICD-10-CM | POA: Diagnosis present

## 2018-04-01 DIAGNOSIS — E876 Hypokalemia: Secondary | ICD-10-CM | POA: Diagnosis not present

## 2018-04-01 DIAGNOSIS — E039 Hypothyroidism, unspecified: Secondary | ICD-10-CM | POA: Diagnosis present

## 2018-04-01 DIAGNOSIS — Z7951 Long term (current) use of inhaled steroids: Secondary | ICD-10-CM

## 2018-04-01 DIAGNOSIS — R402363 Coma scale, best motor response, obeys commands, at hospital admission: Secondary | ICD-10-CM | POA: Diagnosis present

## 2018-04-01 DIAGNOSIS — R0603 Acute respiratory distress: Secondary | ICD-10-CM | POA: Diagnosis not present

## 2018-04-01 DIAGNOSIS — X58XXXA Exposure to other specified factors, initial encounter: Secondary | ICD-10-CM | POA: Diagnosis present

## 2018-04-01 DIAGNOSIS — Z881 Allergy status to other antibiotic agents status: Secondary | ICD-10-CM

## 2018-04-01 DIAGNOSIS — Z87891 Personal history of nicotine dependence: Secondary | ICD-10-CM

## 2018-04-01 DIAGNOSIS — J90 Pleural effusion, not elsewhere classified: Secondary | ICD-10-CM | POA: Diagnosis not present

## 2018-04-01 DIAGNOSIS — Z7902 Long term (current) use of antithrombotics/antiplatelets: Secondary | ICD-10-CM

## 2018-04-01 LAB — CBC WITH DIFFERENTIAL/PLATELET
Abs Immature Granulocytes: 0.1 10*3/uL — ABNORMAL HIGH (ref 0.00–0.07)
Basophils Absolute: 0 10*3/uL (ref 0.0–0.1)
Basophils Relative: 0 %
Eosinophils Absolute: 0.1 10*3/uL (ref 0.0–0.5)
Eosinophils Relative: 1 %
HCT: 25.7 % — ABNORMAL LOW (ref 36.0–46.0)
Hemoglobin: 7.3 g/dL — ABNORMAL LOW (ref 12.0–15.0)
Lymphocytes Relative: 15 %
Lymphs Abs: 1.5 10*3/uL (ref 0.7–4.0)
MCH: 29.9 pg (ref 26.0–34.0)
MCHC: 28.4 g/dL — AB (ref 30.0–36.0)
MCV: 105.3 fL — ABNORMAL HIGH (ref 80.0–100.0)
Monocytes Absolute: 0.9 10*3/uL (ref 0.1–1.0)
Monocytes Relative: 9 %
Myelocytes: 1 %
NEUTROS ABS: 7.5 10*3/uL (ref 1.7–7.7)
NEUTROS PCT: 74 %
NRBC: 2 /100{WBCs} — AB
PLATELETS: 286 10*3/uL (ref 150–400)
RBC: 2.44 MIL/uL — ABNORMAL LOW (ref 3.87–5.11)
RDW: 26 % — ABNORMAL HIGH (ref 11.5–15.5)
SMEAR REVIEW: NORMAL
WBC: 10.1 10*3/uL (ref 4.0–10.5)
nRBC: 2.6 % — ABNORMAL HIGH (ref 0.0–0.2)

## 2018-04-01 LAB — BLOOD GAS, VENOUS
Acid-Base Excess: 14 mmol/L — ABNORMAL HIGH (ref 0.0–2.0)
Bicarbonate: 42.4 mmol/L — ABNORMAL HIGH (ref 20.0–28.0)
Delivery systems: POSITIVE
FIO2: 0.4
O2 Saturation: 74.4 %
PO2 VEN: 40 mmHg (ref 32.0–45.0)
Patient temperature: 37
pCO2, Ven: 70 mmHg — ABNORMAL HIGH (ref 44.0–60.0)
pH, Ven: 7.39 (ref 7.250–7.430)

## 2018-04-01 LAB — BASIC METABOLIC PANEL
Anion gap: 8 (ref 5–15)
BUN: 30 mg/dL — ABNORMAL HIGH (ref 8–23)
CO2: 36 mmol/L — ABNORMAL HIGH (ref 22–32)
Calcium: 8.1 mg/dL — ABNORMAL LOW (ref 8.9–10.3)
Chloride: 94 mmol/L — ABNORMAL LOW (ref 98–111)
Creatinine, Ser: 0.79 mg/dL (ref 0.44–1.00)
GFR calc Af Amer: >60 mL/min (ref >60)
GFR calc non Af Amer: >60 mL/min (ref >60)
Glucose, Bld: 101 mg/dL — ABNORMAL HIGH (ref 70–99)
Potassium: 3.5 mmol/L (ref 3.5–5.1)
Sodium: 138 mmol/L (ref 135–145)

## 2018-04-01 LAB — INFLUENZA PANEL BY PCR (TYPE A & B)
Influenza A By PCR: NEGATIVE
Influenza B By PCR: NEGATIVE

## 2018-04-01 LAB — CG4 I-STAT (LACTIC ACID): Lactic Acid, Venous: 0.58 mmol/L (ref 0.5–1.9)

## 2018-04-01 LAB — TROPONIN I: Troponin I: <0.03 ng/mL (ref <0.03)

## 2018-04-01 LAB — BRAIN NATRIURETIC PEPTIDE: B Natriuretic Peptide: 207 pg/mL — ABNORMAL HIGH (ref 0.0–100.0)

## 2018-04-01 LAB — GLUCOSE, CAPILLARY: Glucose-Capillary: 86 mg/dL (ref 70–99)

## 2018-04-01 MED ORDER — SODIUM CHLORIDE 0.9 % IV SOLN: 10.0000 mL/h | Freq: Once | INTRAVENOUS | Status: DC

## 2018-04-01 MED ORDER — PANTOPRAZOLE SODIUM 40 MG PO TBEC
40.0000 mg | DELAYED_RELEASE_TABLET | Freq: Every day | ORAL | Status: DC
  Administered 2018-04-02 – 2018-04-04 (×3): 40 mg via ORAL
  Filled 2018-04-01 (×3): qty 1

## 2018-04-01 MED ORDER — MUPIROCIN 2 % EX OINT
1.0000 "application " | TOPICAL_OINTMENT | Freq: Every day | CUTANEOUS | Status: DC | PRN
  Filled 2018-04-01: qty 22

## 2018-04-01 MED ORDER — ALBUTEROL SULFATE (2.5 MG/3ML) 0.083% IN NEBU
2.5000 mg | INHALATION_SOLUTION | RESPIRATORY_TRACT | Status: DC | PRN
  Administered 2018-04-02 – 2018-04-04 (×3): 2.5 mg via RESPIRATORY_TRACT
  Filled 2018-04-01 (×3): qty 3

## 2018-04-01 MED ORDER — NITROGLYCERIN 0.4 MG SL SUBL: 0.4000 mg | SUBLINGUAL_TABLET | SUBLINGUAL | Status: DC | PRN

## 2018-04-01 MED ORDER — GUAIFENESIN-DM 100-10 MG/5ML PO SYRP
5.0000 mL | ORAL_SOLUTION | ORAL | Status: DC | PRN
  Administered 2018-04-03: 5 mL via ORAL
  Filled 2018-04-01 (×2): qty 5

## 2018-04-01 MED ORDER — CEFAZOLIN SODIUM-DEXTROSE 2-4 GM/100ML-% IV SOLN
2.0000 g | Freq: Three times a day (TID) | INTRAVENOUS | Status: DC
  Administered 2018-04-02 – 2018-04-04 (×8): 2 g via INTRAVENOUS
  Filled 2018-04-01 (×13): qty 100

## 2018-04-01 MED ORDER — DICLOFENAC SODIUM 1 % TD GEL
2.0000 g | Freq: Four times a day (QID) | TRANSDERMAL | Status: DC
  Administered 2018-04-02 – 2018-04-04 (×9): 2 g via TOPICAL
  Filled 2018-04-01 (×2): qty 100

## 2018-04-01 MED ORDER — ASPIRIN EC 81 MG PO TBEC
81.0000 mg | DELAYED_RELEASE_TABLET | Freq: Every day | ORAL | Status: DC
  Administered 2018-04-02 – 2018-04-04 (×3): 81 mg via ORAL
  Filled 2018-04-01 (×3): qty 1

## 2018-04-01 MED ORDER — MOMETASONE FURO-FORMOTEROL FUM 100-5 MCG/ACT IN AERO
2.0000 | INHALATION_SPRAY | Freq: Two times a day (BID) | RESPIRATORY_TRACT | Status: DC
  Administered 2018-04-02 (×3): 2 via RESPIRATORY_TRACT
  Filled 2018-04-01: qty 8.8

## 2018-04-01 MED ORDER — ENOXAPARIN SODIUM 40 MG/0.4ML ~~LOC~~ SOLN
40.0000 mg | SUBCUTANEOUS | Status: DC
  Administered 2018-04-02 – 2018-04-03 (×3): 40 mg via SUBCUTANEOUS
  Filled 2018-04-01 (×3): qty 0.4

## 2018-04-01 MED ORDER — FUROSEMIDE 10 MG/ML IJ SOLN
60.0000 mg | Freq: Once | INTRAMUSCULAR | Status: AC
  Administered 2018-04-01: 60 mg via INTRAVENOUS
  Filled 2018-04-01: qty 8

## 2018-04-01 MED ORDER — VITAMIN D 25 MCG (1000 UNIT) PO TABS
1000.0000 [IU] | ORAL_TABLET | Freq: Every day | ORAL | Status: DC
  Administered 2018-04-02 – 2018-04-04 (×2): 1000 [IU] via ORAL
  Filled 2018-04-01 (×3): qty 1

## 2018-04-01 MED ORDER — FLUTICASONE PROPIONATE 50 MCG/ACT NA SUSP: 1.0000 | Freq: Every day | NASAL | Status: DC | PRN

## 2018-04-01 MED ORDER — PHENOL 1.4 % MT LIQD
1.0000 | OROMUCOSAL | Status: DC | PRN
  Filled 2018-04-01: qty 177

## 2018-04-01 MED ORDER — LIP MEDEX EX OINT
1.0000 "application " | TOPICAL_OINTMENT | CUTANEOUS | Status: DC | PRN
  Filled 2018-04-01 (×2): qty 7

## 2018-04-01 MED ORDER — MIRABEGRON ER 25 MG PO TB24
25.0000 mg | ORAL_TABLET | Freq: Every day | ORAL | Status: DC
  Administered 2018-04-02 – 2018-04-03 (×2): 25 mg via ORAL
  Filled 2018-04-01 (×4): qty 1

## 2018-04-01 MED ORDER — POLYVINYL ALCOHOL 1.4 % OP SOLN
1.0000 [drp] | OPHTHALMIC | Status: DC | PRN
  Filled 2018-04-01: qty 15

## 2018-04-01 MED ORDER — SODIUM CHLORIDE 0.9 % IV SOLN
250.0000 mL | INTRAVENOUS | Status: DC | PRN
  Administered 2018-04-03: 250 mL via INTRAVENOUS

## 2018-04-01 MED ORDER — ACETAMINOPHEN 325 MG PO TABS: 650.0000 mg | ORAL_TABLET | ORAL | Status: DC | PRN

## 2018-04-01 MED ORDER — RISPERIDONE 0.25 MG PO TABS
0.2500 mg | ORAL_TABLET | Freq: Every day | ORAL | Status: DC
  Administered 2018-04-02 – 2018-04-03 (×2): 0.25 mg via ORAL
  Filled 2018-04-01 (×5): qty 1

## 2018-04-01 MED ORDER — DILTIAZEM HCL ER COATED BEADS 120 MG PO CP24
240.0000 mg | ORAL_CAPSULE | Freq: Every day | ORAL | Status: DC
  Administered 2018-04-02 – 2018-04-04 (×2): 240 mg via ORAL
  Filled 2018-04-01: qty 2
  Filled 2018-04-01: qty 1
  Filled 2018-04-01: qty 2

## 2018-04-01 MED ORDER — SODIUM CHLORIDE 0.9% FLUSH
3.0000 mL | Freq: Two times a day (BID) | INTRAVENOUS | Status: DC
  Administered 2018-04-02 – 2018-04-04 (×6): 3 mL via INTRAVENOUS

## 2018-04-01 MED ORDER — CEFAZOLIN IV (FOR PTA / DISCHARGE USE ONLY): 2.0000 g | Freq: Three times a day (TID) | INTRAVENOUS | Status: DC

## 2018-04-01 MED ORDER — IBUPROFEN 400 MG PO TABS
600.0000 mg | ORAL_TABLET | Freq: Four times a day (QID) | ORAL | Status: DC | PRN
  Administered 2018-04-02 – 2018-04-03 (×3): 600 mg via ORAL
  Filled 2018-04-01 (×3): qty 2

## 2018-04-01 MED ORDER — LISINOPRIL 5 MG PO TABS
2.5000 mg | ORAL_TABLET | Freq: Every day | ORAL | Status: DC
  Administered 2018-04-02 – 2018-04-04 (×3): 2.5 mg via ORAL
  Filled 2018-04-01 (×3): qty 1

## 2018-04-01 MED ORDER — CARVEDILOL 3.125 MG PO TABS
3.1250 mg | ORAL_TABLET | Freq: Two times a day (BID) | ORAL | Status: DC
  Administered 2018-04-02: 3.125 mg via ORAL
  Filled 2018-04-01: qty 1

## 2018-04-01 MED ORDER — CIPROFLOXACIN-DEXAMETHASONE 0.3-0.1 % OT SUSP
1.0000 [drp] | Freq: Every day | OTIC | Status: DC | PRN
  Filled 2018-04-01: qty 7.5

## 2018-04-01 MED ORDER — FUROSEMIDE 10 MG/ML IJ SOLN
40.0000 mg | Freq: Two times a day (BID) | INTRAMUSCULAR | Status: DC
  Administered 2018-04-02 – 2018-04-04 (×5): 40 mg via INTRAVENOUS
  Filled 2018-04-01 (×5): qty 4

## 2018-04-01 MED ORDER — FERROUS FUMARATE 324 (106 FE) MG PO TABS
1.0000 | ORAL_TABLET | Freq: Every day | ORAL | Status: DC
  Filled 2018-04-01: qty 1

## 2018-04-01 MED ORDER — ALUM & MAG HYDROXIDE-SIMETH 200-200-20 MG/5ML PO SUSP
30.0000 mL | ORAL | Status: DC | PRN
  Filled 2018-04-01: qty 30

## 2018-04-01 MED ORDER — SODIUM CHLORIDE 0.9% FLUSH: 3.0000 mL | INTRAVENOUS | Status: DC | PRN

## 2018-04-01 MED ORDER — MAGNESIUM OXIDE 400 (241.3 MG) MG PO TABS
400.0000 mg | ORAL_TABLET | Freq: Every day | ORAL | Status: DC
  Administered 2018-04-02 – 2018-04-04 (×3): 400 mg via ORAL
  Filled 2018-04-01 (×3): qty 1

## 2018-04-01 MED ORDER — ONDANSETRON HCL 4 MG/2ML IJ SOLN: 4.0000 mg | Freq: Four times a day (QID) | INTRAMUSCULAR | Status: DC | PRN
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
## 2018-04-01 NOTE — Progress Notes (Signed)
Family Meeting Note  Advance Directive:yes  Today a meeting took place with the Patient and husband.  Patient is able to participate   The following clinical team members were present during this meeting:MD  The following were discussed:Patient's diagnosis: Respiratory failure, Patient's progosis: Unable to determine and Goals for treatment: DNR  Additional follow-up to be provided: prn  Time spent during discussion:20 minutes  Bertrum SolMontell D Nickia Boesen, MD

## 2018-04-01 NOTE — ED Notes (Signed)
I stat lactic is .57
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
## 2018-04-01 NOTE — ED Notes (Signed)
Report given.

## 2018-04-01 NOTE — ED Notes (Signed)
Pt is refusing to allow any blood draw. At first refused blood draw from picc line as well, but then agreed.

## 2018-04-01 NOTE — ED Triage Notes (Signed)
Pt from [facility] - usually one 3 liters oxygen. Was sat'ing 63 on ems arrival. Pt placed on cpap and given 2 albuterol treatments. Pt refusing blood draw at this time

## 2018-04-02 LAB — CBC
HCT: 25.8 % — ABNORMAL LOW (ref 36.0–46.0)
HCT: 25.1 % — ABNORMAL LOW (ref 36.0–46.0)
Hemoglobin: 7.4 g/dL — ABNORMAL LOW (ref 12.0–15.0)
Hemoglobin: 7.1 g/dL — ABNORMAL LOW (ref 12.0–15.0)
MCH: 29.7 pg (ref 26.0–34.0)
MCH: 29.5 pg (ref 26.0–34.0)
MCHC: 28.7 g/dL — AB (ref 30.0–36.0)
MCHC: 28.3 g/dL — AB (ref 30.0–36.0)
MCV: 103.6 fL — ABNORMAL HIGH (ref 80.0–100.0)
MCV: 104.1 fL — ABNORMAL HIGH (ref 80.0–100.0)
Platelets: 234 10*3/uL (ref 150–400)
Platelets: 239 10*3/uL (ref 150–400)
RBC: 2.49 MIL/uL — ABNORMAL LOW (ref 3.87–5.11)
RBC: 2.41 MIL/uL — ABNORMAL LOW (ref 3.87–5.11)
RDW: 26.5 % — AB (ref 11.5–15.5)
RDW: 26.2 % — ABNORMAL HIGH (ref 11.5–15.5)
WBC: 9.7 10*3/uL (ref 4.0–10.5)
WBC: 8.7 10*3/uL (ref 4.0–10.5)
nRBC: 2 % — ABNORMAL HIGH (ref 0.0–0.2)
nRBC: 1.8 % — ABNORMAL HIGH (ref 0.0–0.2)

## 2018-04-02 LAB — BASIC METABOLIC PANEL
Anion gap: 9 (ref 5–15)
BUN: 27 mg/dL — ABNORMAL HIGH (ref 8–23)
CO2: 36 mmol/L — ABNORMAL HIGH (ref 22–32)
Calcium: 8.1 mg/dL — ABNORMAL LOW (ref 8.9–10.3)
Chloride: 97 mmol/L — ABNORMAL LOW (ref 98–111)
Creatinine, Ser: 0.72 mg/dL (ref 0.44–1.00)
GFR calc Af Amer: >60 mL/min (ref >60)
GFR calc non Af Amer: >60 mL/min (ref >60)
Glucose, Bld: 81 mg/dL (ref 70–99)
Potassium: 3.3 mmol/L — ABNORMAL LOW (ref 3.5–5.1)
Sodium: 142 mmol/L (ref 135–145)

## 2018-04-02 LAB — TROPONIN I
Troponin I: <0.03 ng/mL (ref <0.03)
Troponin I: <0.03 ng/mL (ref <0.03)
Troponin I: <0.03 ng/mL (ref <0.03)

## 2018-04-02 LAB — TSH: TSH: 14.21 u[IU]/mL — AB (ref 0.350–4.500)

## 2018-04-02 LAB — PHOSPHORUS: Phosphorus: 4.2 mg/dL (ref 2.5–4.6)

## 2018-04-02 LAB — MRSA PCR SCREENING: MRSA by PCR: NEGATIVE

## 2018-04-02 LAB — PREPARE RBC (CROSSMATCH)

## 2018-04-02 LAB — MAGNESIUM: Magnesium: 2.2 mg/dL (ref 1.7–2.4)

## 2018-04-02 MED ORDER — POTASSIUM CHLORIDE 20 MEQ PO PACK
40.0000 meq | PACK | Freq: Once | ORAL | Status: AC
  Administered 2018-04-02: 40 meq via ORAL

## 2018-04-02 MED ORDER — POLYETHYLENE GLYCOL 3350 17 G PO PACK
17.0000 g | PACK | Freq: Two times a day (BID) | ORAL | Status: DC
  Administered 2018-04-02 – 2018-04-03 (×2): 17 g via ORAL
  Filled 2018-04-02 (×4): qty 1

## 2018-04-02 MED ORDER — DIPHENHYDRAMINE HCL 25 MG PO CAPS
25.0000 mg | ORAL_CAPSULE | Freq: Once | ORAL | Status: AC
  Administered 2018-04-02: 25 mg via ORAL
  Filled 2018-04-02: qty 1

## 2018-04-02 MED ORDER — ACETAMINOPHEN 325 MG PO TABS
650.0000 mg | ORAL_TABLET | Freq: Once | ORAL | Status: AC
  Administered 2018-04-02: 650 mg via ORAL
  Filled 2018-04-02: qty 2

## 2018-04-02 MED ORDER — FUROSEMIDE 10 MG/ML IJ SOLN
20.0000 mg | Freq: Once | INTRAMUSCULAR | Status: AC
  Administered 2018-04-02: 20 mg via INTRAVENOUS
  Filled 2018-04-02: qty 2

## 2018-04-02 MED ORDER — FLEET ENEMA 7-19 GM/118ML RE ENEM
1.0000 | ENEMA | Freq: Every day | RECTAL | Status: DC | PRN
  Administered 2018-04-02: 1 via RECTAL
  Filled 2018-04-02: qty 1

## 2018-04-02 MED ORDER — SODIUM CHLORIDE 0.9% IV SOLUTION: Freq: Once | INTRAVENOUS | Status: DC

## 2018-04-02 MED ORDER — LEVOTHYROXINE SODIUM 25 MCG PO TABS
25.0000 ug | ORAL_TABLET | Freq: Every day | ORAL | Status: DC
  Administered 2018-04-03 – 2018-04-04 (×2): 25 ug via ORAL
  Filled 2018-04-02 (×2): qty 1

## 2018-04-02 MED ORDER — FERROUS SULFATE 220 (44 FE) MG/5ML PO ELIX
220.0000 mg | ORAL_SOLUTION | Freq: Three times a day (TID) | ORAL | Status: DC
  Administered 2018-04-02 – 2018-04-04 (×6): 220 mg via ORAL
  Filled 2018-04-02 (×11): qty 5

## 2018-04-02 MED ORDER — LACTULOSE 10 GM/15ML PO SOLN
20.0000 g | Freq: Two times a day (BID) | ORAL | Status: DC
  Administered 2018-04-02 – 2018-04-04 (×4): 20 g via ORAL
  Filled 2018-04-02 (×5): qty 30

## 2018-04-02 NOTE — Progress Notes (Signed)
Pt being transferred to room 254. Report called to Darl PikesSusan, RN. Pt and belongings transferred to room 254 without incident.

## 2018-04-02 NOTE — Plan of Care (Signed)
Nutrition Education Note  RD consulted for nutrition education regarding new onset CHF.  Met with patient and her husband at bedside. Patient is not appropriate for education. She has very limited understanding of disease process and had difficulty participating in education. She resides at [facility]. She can be ordered for 2 gram sodium diet with a fluid restriction at facility.  RD provided "Heart Failure Nutrition Therapy" handout from the Academy of [employer]. Reviewed patient's dietary recall. Provided examples on ways to decrease sodium intake in diet. Discouraged intake of processed foods and use of salt shaker. Encouraged fresh fruits and vegetables as well as whole grain sources of carbohydrates to maximize fiber intake.   RD discussed why it is important for patient to adhere to diet recommendations, and emphasized the role of fluids, foods to avoid, and importance of weighing self daily. Teach back method used.  Expect poor compliance as patient is not appropriate for education. However, she resides at a facility, so she can just be placed on 2 gram sodium diet there.  Body mass index is 37.94 kg/m. Pt meets criteria for Obesity Class II based on current BMI.  Current diet order is 2 gram sodium with 1.2 L fluid restriction. Labs and medications reviewed. No further nutrition interventions warranted at this time. RD contact information provided. If additional nutrition issues arise, please re-consult RD.   Willey Blade, MS, [place], LDN Office: [phone] Pager: [phone] After Hours/Weekend Pager: [phone]
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
## 2018-04-02 NOTE — Progress Notes (Signed)
Pt accepted in transfer from [facility]. She is in no distress. High flo 02 at 6 litres. Pt satting 99%. Pt states she has not had a bm in "weeks." lactulose and prn fleets ordered per dr. Katheren ShamsSalary. Husband at bedside. Report from [facility]. Tele verified by two.

## 2018-04-02 NOTE — Progress Notes (Signed)
PT Cancellation Note  Patient Details Name: Jaime [name] MRN: [id number] DOB: [birth year]   Cancelled Treatment:    Reason Eval/Treat Not Completed: Medical issues which prohibited therapy.  Awaiting transfusion and will try back at another time when pt is medically ready.   Ivar DrapeRuth E Annabeth Tortora 04/02/2018, 2:33 PM   Samul Dadauth Cathaleen Korol, PT MS Acute Rehab Dept. Number: [facility] [phone] and [facility] [phone]

## 2018-04-03 ENCOUNTER — Encounter: Payer: Self-pay | Admitting: Primary Care

## 2018-04-03 DIAGNOSIS — Z515 Encounter for palliative care: Secondary | ICD-10-CM

## 2018-04-03 DIAGNOSIS — Z7189 Other specified counseling: Secondary | ICD-10-CM

## 2018-04-03 LAB — BASIC METABOLIC PANEL
Anion gap: 8 (ref 5–15)
BUN: 32 mg/dL — ABNORMAL HIGH (ref 8–23)
CO2: 37 mmol/L — ABNORMAL HIGH (ref 22–32)
Calcium: 8.1 mg/dL — ABNORMAL LOW (ref 8.9–10.3)
Chloride: 95 mmol/L — ABNORMAL LOW (ref 98–111)
Creatinine, Ser: 0.79 mg/dL (ref 0.44–1.00)
GFR calc Af Amer: >60 mL/min (ref >60)
GFR calc non Af Amer: >60 mL/min (ref >60)
Glucose, Bld: 91 mg/dL (ref 70–99)
Potassium: 3.3 mmol/L — ABNORMAL LOW (ref 3.5–5.1)
Sodium: 140 mmol/L (ref 135–145)

## 2018-04-03 LAB — HEMOGLOBIN AND HEMATOCRIT, BLOOD
HCT: 28.5 % — ABNORMAL LOW (ref 36.0–46.0)
Hemoglobin: 8.3 g/dL — ABNORMAL LOW (ref 12.0–15.0)

## 2018-04-03 LAB — PREPARE RBC (CROSSMATCH)

## 2018-04-03 MED ORDER — OCUVITE-LUTEIN PO CAPS
1.0000 | ORAL_CAPSULE | Freq: Every day | ORAL | Status: DC
  Administered 2018-04-04: 1 via ORAL
  Filled 2018-04-03 (×2): qty 1

## 2018-04-03 MED ORDER — OXYCODONE-ACETAMINOPHEN 5-325 MG PO TABS: 1.0000 | ORAL_TABLET | Freq: Two times a day (BID) | ORAL | Status: DC | PRN

## 2018-04-03 MED ORDER — ENSURE ENLIVE PO LIQD: 237.0000 mL | Freq: Two times a day (BID) | ORAL | Status: DC

## 2018-04-03 MED ORDER — AMITRIPTYLINE HCL 25 MG PO TABS
50.0000 mg | ORAL_TABLET | Freq: Every day | ORAL | Status: DC
  Administered 2018-04-03: 50 mg via ORAL
  Filled 2018-04-03: qty 2

## 2018-04-03 MED ORDER — POTASSIUM CHLORIDE 20 MEQ PO PACK
40.0000 meq | PACK | Freq: Once | ORAL | Status: AC
  Administered 2018-04-03: 40 meq via ORAL
  Filled 2018-04-03: qty 2

## 2018-04-03 NOTE — Progress Notes (Signed)
Pt still c/o constipation. Fleet enema administered with minimal success. Pt passing some semi solid, mushy, stools and flatulence. Disimpaction attempted per pt request, with no success. Lactulose also given. Pt states she's "just too weak to push it out".

## 2018-04-03 NOTE — Evaluation (Signed)
Physical Therapy Evaluation Patient Details Name: Jaime Spittleatricia S Kujawa MRN: [id number] DOB: 1927-09-17 Today's Date: 04/03/2018   History of Present Illness  Pt is an 84 y.o. female with a known history that includes anemia, blindess, CHF, low back compression fracture, GERD, HTN, and lymphedema with a noted recent hospital discharge for Acute vertebral Osteomyelitis/Discitis on IV ABX, presented from local nursing home with acute shortness of breath with O2 saturation of 60% on EMS evaluation, patient placed on CPAP in route.  Pt's chest x-ray was noted for worsening heart failure/increased effusions/bilateral airspace disease.  Assessment includes: Acute on chronic hypoxic respiratory failure, Acute on chronic diastolic congestive heart failure exacerbation, Acute on chronic anemia, Acute vertebral Osteomyelitis/Discitis T12-L1, chronic back pain, MSSA bacteremia, COPD without exacerbation, HTN, and GERD.    Clinical Impression  Pt presents with deficits in strength, transfers, mobility, gait, balance, and activity tolerance.  Pt required near total +2 assist during bed mobility training using the log roll technique to minimize back pain with mobility.  Pt was able to tolerate unsupported sitting for around 2 min along with some gentle seated exercises but refused multiple times to attempt to stand secondary to back pain.  Pt requested return to supine due to increasing back pain and was positioned for comfort.  Pt admitted to acute care directly from a local SNF and would benefit from returning to a SNF setting to continue addressing the above deficits for eventual return to her prior living situation safely and with decreased caregiver assistance.       Follow Up Recommendations SNF    Equipment Recommendations  None recommended by PT    Recommendations for Other Services       Precautions / Restrictions Precautions Precautions: Fall Precaution Comments: contact iso Restrictions Weight  Bearing Restrictions: No      Mobility  Bed Mobility Overal bed mobility: Needs Assistance Bed Mobility: Supine to Sit;Sit to Supine Rolling: +2 for physical assistance;Mod assist   Supine to sit: Max assist;+2 for physical assistance Sit to supine: Max assist;+2 for physical assistance   General bed mobility comments: Log roll technique training provided with pt requiring near total assist during sup to/from sit  Transfers                 General transfer comment: Encouragement provided to attempt to stand with pt unable to tolerate due to pain  Ambulation/Gait             General Gait Details: unable to tolerate due to pain  Stairs            Wheelchair Mobility    Modified Rankin (Stroke Patients Only)       Balance Overall balance assessment: Needs assistance Sitting-balance support: Bilateral upper extremity supported Sitting balance-Leahy Scale: Fair         Standing balance comment: Unable to stand secondary to back pain                             Pertinent Vitals/Pain Pain Assessment: 0-10 Pain Score: 8  Faces Pain Scale: Hurts whole lot Pain Location: back with movement; 0/10 pain at rest Pain Descriptors / Indicators: Sore;Aching;Grimacing;Moaning Pain Intervention(s): Limited activity within patient's tolerance;Repositioned    Home Living Family/patient expects to be discharged to:: Private residence Living Arrangements: Spouse/significant other Available Help at Discharge: Family;Available 24 hours/day Type of Home: House Home Access: Ramped entrance     Home Layout: One level Home  Equipment: Production assistant, radiohower seat;Cane - single point;Walker - 2 wheels;Wheelchair - manual Additional Comments: Husband avaiable 24/7--pt is blind and reads Braille and has many talking clocks in the home and a printer that prints in Braille and voice dictation on computer    Prior Function Level of Independence: Independent with assistive  device(s)         Comments: Mod indep with SPC/RW for ADLs, household mobilization; spouse assists with household chores as needed.  No home O2.  Does endorse at least 4-5 falls in the last 2-3 months.      Hand Dominance   Dominant Hand: Right    Extremity/Trunk Assessment   Upper Extremity Assessment Upper Extremity Assessment: Generalized weakness    Lower Extremity Assessment Lower Extremity Assessment: Generalized weakness       Communication   Communication: HOH  Cognition Arousal/Alertness: Awake/alert Behavior During Therapy: Flat affect Overall Cognitive Status: Within Functional Limits for tasks assessed                                 General Comments: Pt is blind and hears better in L ear      General Comments      Exercises Total Joint Exercises Ankle Circles/Pumps: Strengthening;Both;10 reps Quad Sets: Strengthening;Both;5 reps;10 reps Heel Slides: AAROM;Both;5 reps Hip ABduction/ADduction: AAROM;Both;5 reps Straight Leg Raises: AAROM;Both;5 reps Long Arc Quad: AROM;Both;5 reps Knee Flexion: AROM;Both;5 reps   Assessment/Plan    PT Assessment Patient needs continued PT services  PT Problem List Decreased strength;Decreased activity tolerance;Decreased balance;Decreased mobility;Cardiopulmonary status limiting activity;Obesity;Pain       PT Treatment Interventions DME instruction;Gait training;Functional mobility training;Therapeutic activities;Therapeutic exercise;Balance training;Patient/family education    PT Goals (Current goals can be found in the Care Plan section)  Acute Rehab PT Goals Patient Stated Goal: To get stronger so I can walk again PT Goal Formulation: With patient Time For Goal Achievement: 04/16/18 Potential to Achieve Goals: Fair    Frequency Min 2X/week   Barriers to discharge Decreased caregiver support      Co-evaluation               AM-PAC PT "6 Clicks" Mobility  Outcome Measure Help  needed turning from your back to your side while in a flat bed without using bedrails?: A Lot Help needed moving from lying on your back to sitting on the side of a flat bed without using bedrails?: A Lot Help needed moving to and from a bed to a chair (including a wheelchair)?: Total Help needed standing up from a chair using your arms (e.g., wheelchair or bedside chair)?: Total Help needed to walk in hospital room?: Total Help needed climbing 3-5 steps with a railing? : Total 6 Click Score: 8    End of Session Equipment Utilized During Treatment: Oxygen Activity Tolerance: Patient limited by pain Patient left: in bed;with bed alarm set;with call bell/phone within reach;with family/visitor present;with nursing/sitter in room Nurse Communication: Mobility status PT Visit Diagnosis: Pain;Difficulty in walking, not elsewhere classified (R26.2);Muscle weakness (generalized) (M62.81);Repeated falls (R29.6) Pain - part of body: (back)    Time: [id number] PT Time Calculation (min) (ACUTE ONLY): 28 min   Charges:   PT Evaluation $PT Eval Moderate Complexity: 1 Mod PT Treatments $Therapeutic Exercise: 8-22 mins        D. Scott Dayton Sherr PT, DPT 04/03/18, 11:18 AM

## 2018-04-03 NOTE — Clinical Social Work Note (Signed)
Clinical Social Work Assessment  Patient Details  Name: Jaime [name] MRN: [id number] Date of Birth: [birth year]  Date of referral:  04/03/18               Reason for consult:  Other (Comment Required)(From [facility]. )                Permission sought to share information with:  Facility Art therapist granted to share information::  Yes, Verbal Permission Granted  Name::      [facility]  Agency::     Relationship::     Contact Information:     Housing/Transportation Living arrangements for the past 2 months:  [place] of Information:  Patient, Spouse Patient Interpreter Needed:  None Criminal Activity/Legal Involvement Pertinent to Current Situation/Hospitalization:  No - Comment as needed Significant Relationships:  Adult Children, Spouse Lives with:  Facility Resident Do you feel safe going back to the place where you live?  Yes Need for family participation in patient care:  Yes (Comment)  Care giving concerns:  Patient is a short term rehab resident at [facility].    Social Worker assessment / plan:  Holiday representative ([place]) reviewed chart and noted that patient is from [place]. Per [facility] admissions coordinator at [facility] patient can return to [facility] if a new [facility] SNF authorization is received. Per Liliane Channel he will start [facility] SNF authorization today. CSW met with patient and made her aware of above. Patient is agreeable to return to [facility]. CSW contacted patient's husband Gwyndolyn Saxon and made him aware of above. Gwyndolyn Saxon is in agreement with plan. CSW will continue to follow and assist as needed.   Employment status:  Disabled (Comment on whether or not currently receiving Disability), Retired Nurse, adult PT Recommendations:  [place] / Referral to community resources:  [place]  Patient/Family's Response to care:  Patient is agreeable to  return to [facility].   Patient/Family's Understanding of and Emotional Response to Diagnosis, Current Treatment, and Prognosis:  Patient was very pleasant and thanked CSW for assistance.   Emotional Assessment Appearance:  Appears stated age Attitude/Demeanor/Rapport:    Affect (typically observed):  Accepting, Adaptable, Pleasant Orientation:  Oriented to Self, Oriented to Place, Oriented to  Time Alcohol / Substance use:  Not Applicable Psych involvement (Current and /or in the community):  No (Comment)  Discharge Needs  Concerns to be addressed:  Discharge Planning Concerns Readmission within the last 30 days:  No Current discharge risk:  Dependent with Mobility, Chronically ill Barriers to Discharge:  Continued Medical Work up   [employer], Veronia Beets, LCSW 04/03/2018, 6:02 PM

## 2018-04-03 NOTE — Care Management Note (Signed)
Case Management Note  Patient Details  Name: Jaime [name] MRN: [id number] Date of Birth: 1928-03-19  Subjective/Objective:   Admitted to [facility] from [facility] with the diagnosis of respiratory failure. Discharged from this facility to [facility] 03/13/18. Husband is Jaime [name] [phone]).  Sees Dr. Irena ReichmannBacijalupo as primary care physician Home Health per [facility] in the past. Currently at [facility] for long term antibiotics. Receiving Cefazolin 2 grams every 8 hours per PICC line. Last does is scheduled for 04/15/18.  [place] [facility] in the past.  Chronic oxygen 3 liters per nasal cannula. Followed by Palliative at [facility].                 Action/Plan: Received referral for [facility].  Will continue to follow for transition of care needs.   Expected Discharge Date:  03/13/18               Expected Discharge Plan:     In-House Referral:   yes  Discharge planning Services   yes  Post Acute Care Choice:    Choice offered to:     DME Arranged:    DME Agency:     HH Arranged:    HH Agency:     Status of Service:     If discussed at [employer] Meetings, dates discussed:    Additional Comments:  Jaime GreetBrenda S Pama Roskos, RN MSN CCM Care Management [phone] 04/03/2018, 9:36 AM
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
## 2018-04-03 NOTE — Progress Notes (Signed)
Palliative: Jaime [name] is resting quietly in bed.  She says help as I enter.  She is blind and does not make eye contact, but does follow me around the room.  She is alert, oriented to person and place at this time.  There is no family at bedside at this time.  Jaime [name] is eating lunch, easily able to feed herself.  She tells me that she is enjoying her sandwich.  She does have a wet quality to her voice, and has a productive cough, quite frequently.  During our conversation social work enters sharing that she is to return to rehab tomorrow, insurance is working on authorization.  On previous PMT consults, Jaime [name] stated that she wanted her daughter Jaime [name] to make choices if she could not.  There is no HCPAO paperwork noted in Epic chart.  Unsure if Power of attorney paperwork has been completed, legally her husband is Education officer, environmentaldecision-maker.  Jaime [name] agrees that I can call her husband of 8 years, Jaime [name]. Call to [phone], no answer, no VM left.  Call to [phone]. Mr. Jaime [name] states he makes health care choice if she cannot.  He feels that her breathing is better.  We discuss wet voice quality, and Mr. Jaime [name] tells me that she "had a touch of PNE".  We talk about Acute resp Failure with CHF and osteomyelitis pain.  We also talked about bacteremia, blood infection.  He tells me that she has been constipated, and he feels that poor nutrition has led to some weakness. We talk about low albumin 2.1. Poor nutrition and healing.    Mr. Jaime [name] states he works with Jaime [name], but "she is so negative", stating that this is her general attitude about life.  He tells me that Jaime [name] comes and gives encouragement, but doesn't say much about [name]'s health condition.  We talk about the treatment plan in detail including antibiotics (I again discussed the seriousness of bacteremia), rehab likely tomorrow.   Mr. Jaime [name] tells me that his wife is very independent, and she likes  to do for herself.  She is working toward being able to get up and walk and move on her own, provide for her own self-care.  I encourage Mr. Jaime [name] to consider how we will care for his wife if/WHEN she gets sick again.  Share that we are doing everything we can for her improvement, and that she is doing all she can, too.  I share my concern about her ability to recover, although this remains to be seen.  I encourage Mr. Jaime [name] to consider her wishes if she is unable to recover to her former function.  PMT will follow up tomorrow. 55 minutes, extended time Jaime [name] , NP Palliative Medicine Team [phone] Greater than 50% of this time was spent counseling and coordinating care related to the above assessment and plan

## 2018-04-03 NOTE — Progress Notes (Signed)
Initial Nutrition Assessment  DOCUMENTATION CODES:   Obesity unspecified  INTERVENTION:  Ensure Enlive po BID, each supplement provides 350 kcal and 20 grams of protein Magic cup TID with meals, each supplement provides 290 kcal and 9 grams of protein MVI   NUTRITION DIAGNOSIS:   Increased nutrient needs related to acute illness(hypoxic resp failure second to CHD exacerbation) as evidenced by edema, energy intake < 75% for > 7 days.  GOAL:   Patient will meet greater than or equal to 90% of their needs   MONITOR:   PO intake, Labs, I & O's, Weight trends, Skin, Supplement acceptance  REASON FOR ASSESSMENT:   Consult Assessment of nutrition requirement/status  ASSESSMENT:   [age] year old patient with known history of blindness, SOB, CHF, GERD, HTN, Craniotomy for hematoma evacuation, Total knee arthroplasty, Left leg lymphedema, spinal stenosis, osteoarthritis, vertebral osteomyelitis admitted with hypoxic respiratory failure second to CHF exacerbation.  Patient lying in bed with husband in room reading a book at visit. Pt reports eating half of a sandwich for lunch today but did not recall having anything else. Lunch tray was still in room with 50% of strawberry yogurt consumed and most of sandwich. Patient informed RD that she feels full after a few bites of food and stated that it is the antibiotics that make her feel that way.   Patient resides at a nursing home and recalls 3 meals/day. Patient reports only eating a few bites at each meal. RD encouraged patient and husband to ask about having snacks in between meals to increase PO intake when she returns.   RD offered ensure to patient during her stay and patient denied stating that she is not allowed to drink. RD explained to patient and husband that she was on a fluid restriction of 1200mL and could have ONS in between meals if wanted.   Medications reviewed and include: voltaren, cardizem, ferrous sulfate, lactulose,  mag-ox  Labs: K 3.3 (L) Cl 95 (L) BUN 32 (H)  12/22 ECHO: Impressions: The right ventricular systolic pressure was increased consistent with moderate pulmonary hypertension.  NUTRITION - FOCUSED PHYSICAL EXAM:    Most Recent Value  Orbital Region  No depletion  Upper Arm Region  Mild depletion  Thoracic and Lumbar Region  Unable to assess  Buccal Region  Mild depletion  Temple Region  Mild depletion  Clavicle Bone Region  No depletion  Clavicle and Acromion Bone Region  Unable to assess  Scapular Bone Region  Unable to assess  Dorsal Hand  Mild depletion  Patellar Region  Unable to assess [pt with mod edema to BLE]  Anterior Thigh Region  Unable to assess  Posterior Calf Region  Unable to assess  Edema (RD Assessment)  Moderate [+2 generalized, +1 RUE,  +2 LUE,  +2 BLE]  Hair  Reviewed  Eyes  Reviewed [blind]  Mouth  Reviewed  Skin  Reviewed [ecchymosis]  Nails  Reviewed       Diet Order:   Diet Order            Diet 2 gram sodium Room service appropriate? Yes; Fluid consistency: Thin; Fluid restriction: 1200 mL Fluid  Diet effective now              EDUCATION NEEDS:   Not appropriate for education at this time  Skin:  Skin Assessment: Reviewed RN Assessment  Last BM:  12/23 type 4; brown; smear  Height:   Ht Readings from Last 1 Encounters:  2017/09/14 5\' 2"  (  1.575 m)    Weight:   Wt Readings from Last 1 Encounters:  04/03/18 93.8 kg    Ideal Body Weight:  50 kg  BMI:  Body mass index is 37.8 kg/m.  Estimated Nutritional Needs:   Kcal:  1400-1750 (28-35g/kg/IBW)  Protein:  60-70g (1.2-1.4g/kg/IBW)  Fluid:  1.2L/day per MD    Lars MassonSuzanne Slayde Brault, RD, LDN  After Hours/Weekend Pager: [phone]

## 2018-04-03 NOTE — [place] FL2 (Signed)
[place] MEDICAID FL2 LEVEL OF CARE SCREENING TOOL     IDENTIFICATION  Patient Name: Jaime [name] Birthdate: 13-Feb-1928 Sex: female Admission Date (Current Location): 03-12-18  [place]   Facility and Address:  [facility], [address], [place], [place] [address]      Provider Number: [id number]  Attending Physician Name and Address:  Bertrum SolSalary, Montell D, MD  Relative Name and Phone Number:       Current Level of Care: Hospital Recommended Level of Care: Skilled Nursing Facility Prior Approval Number:    Date Approved/Denied:   PASRR Number: [id number])  Discharge Plan: SNF    Current Diagnoses: Patient Active Problem List   Diagnosis Date Noted  . Adult failure to thrive   . Dyspnea   . Weakness   . Difficulty walking 03/01/2018  . Persistent atrial fibrillation 02/25/2018  . Acute on chronic diastolic CHF (congestive heart failure) (HCC) 02/25/2018  . Paroxysmal atrial fibrillation (HCC)   . Respiratory failure (HCC) 02/19/2018  . Severe episode of recurrent major depressive disorder, with psychotic features (HCC)   . Back pain 02/17/2018  . Chronic midline low back pain without sciatica   . DNR (do not resuscitate) discussion   . Palliative care by specialist   . Wheezing 01/19/2018  . Frequent falls 10/24/2017  . Near syncope 10/13/2017  . Dizziness 09/28/2017  . Delusional disorder (HCC) 06/03/2017  . Paranoia (HCC) 06/03/2017  . Chronic venous insufficiency 03/30/2017  . Lymphedema 03/30/2017  . Agitation   . Blind   . HOH (hard of hearing)   . Acute respiratory failure (HCC)   . Traumatic brain injury (HCC) 07/30/2014  . 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 Anemia 10/20/2011  . OA (osteoarthritis) of knee 10/18/2011    Orientation RESPIRATION BLADDER Height & Weight     Self, Time, Situation, Place  O2(6 Liters Oxygen. ) Incontinent Weight: 206 lb 10.9 oz (93.8  kg) Height:  5\' 2"  (157.5 cm)  BEHAVIORAL SYMPTOMS/MOOD NEUROLOGICAL BOWEL NUTRITION STATUS      Incontinent Diet(Diet: 2 Grams Sodium. )  AMBULATORY STATUS COMMUNICATION OF NEEDS Skin   Extensive Assist Verbally Normal                       Personal Care Assistance Level of Assistance  Bathing, Feeding, Dressing Bathing Assistance: Limited assistance Feeding assistance: Independent Dressing Assistance: Limited assistance     Functional Limitations Info  Sight, Hearing, Speech Sight Info: Impaired Hearing Info: Impaired Speech Info: Adequate    SPECIAL CARE FACTORS FREQUENCY  PT (By licensed PT), OT (By licensed OT)     PT Frequency: (5) OT Frequency: (5)            Contractures      Additional Factors Info  Code Status, Allergies Code Status Info: (DNR ) Allergies Info: (Codeine, Levofloxacin, Oxycodone, Prednisone, Tramadol Hcl)     Isolation Precautions Info: (ESBL in urine )     Current Medications (04/03/2018):  This is the current hospital active medication list Current Facility-Administered Medications  Medication Dose Route Frequency Provider Last Rate Last Dose  . 0.9 %  sodium chloride infusion (Manually program via Guardrails IV Fluids)   Intravenous Once Salary, Montell D, MD      . 0.9 %  sodium chloride infusion  250 mL Intravenous PRN Salary, Montell D, MD      . acetaminophen (TYLENOL) tablet 650 mg  650 mg Oral  Q4H PRN Salary, Montell D, MD      . albuterol (PROVENTIL) (2.5 MG/3ML) 0.083% nebulizer solution 2.5 mg  2.5 mg Inhalation Q4H PRN Salary, Montell D, MD   2.5 mg at 04/02/18 1425  . alum & mag hydroxide-simeth (MAALOX/MYLANTA) 200-200-20 MG/5ML suspension 30 mL  30 mL Oral Q4H PRN Salary, Montell D, MD      . aspirin EC tablet 81 mg  81 mg Oral Daily Salary, Montell D, MD   81 mg at 04/03/18 1035  . ceFAZolin (ANCEF) IVPB 2g/100 mL premix  2 g Intravenous Q8H Salary, Montell D, MD 200 mL/hr at 04/03/18 0518 2 g at 04/03/18 0518  .  cholecalciferol (VITAMIN D3) tablet 1,000 Units  1,000 Units Oral Daily Salary, Jetty Duhamel D, MD   1,000 Units at 04/02/18 0950  . ciprofloxacin-dexamethasone (CIPRODEX) 0.3-0.1 % OTIC (EAR) suspension 1 drop  1 drop Both EARS Daily PRN Salary, Montell D, MD      . diclofenac sodium (VOLTAREN) 1 % transdermal gel 2 g  2 g Topical QID Salary, Montell D, MD   2 g at 04/03/18 1037  . diltiazem (CARDIZEM CD) 24 hr capsule 240 mg  240 mg Oral Daily Salary, Montell D, MD   240 mg at 04/02/18 0950  . enoxaparin (LOVENOX) injection 40 mg  40 mg Subcutaneous Q24H Salary, Jetty Duhamel D, MD   40 mg at 04/02/18 2127  . ferrous sulfate 220 (44 Fe) MG/5ML solution 220 mg  220 mg Oral TID WC Tukov-Yual, Magdalene S, NP   220 mg at 04/03/18 1036  . furosemide (LASIX) injection 40 mg  40 mg Intravenous BID Angelina Ok D, MD   40 mg at 04/03/18 1035  . guaiFENesin-dextromethorphan (ROBITUSSIN DM) 100-10 MG/5ML syrup 5 mL  5 mL Oral Q4H PRN Salary, Montell D, MD      . ibuprofen (ADVIL,MOTRIN) tablet 600 mg  600 mg Oral Q6H PRN Salary, Montell D, MD   600 mg at 04/03/18 1039  . lactulose (CHRONULAC) 10 GM/15ML solution 20 g  20 g Oral BID Salary, Montell D, MD   20 g at 04/03/18 1035  . levothyroxine (SYNTHROID, LEVOTHROID) tablet 25 mcg  25 mcg Oral [id number] Angelina Ok D, MD   25 mcg at 04/03/18 0516  . lip balm (CARMEX) ointment 1 application  1 application Topical PRN Salary, Montell D, MD      . lisinopril (PRINIVIL,ZESTRIL) tablet 2.5 mg  2.5 mg Oral Daily Salary, Montell D, MD   2.5 mg at 04/03/18 1038  . magnesium oxide (MAG-OX) tablet 400 mg  400 mg Oral Daily Salary, Montell D, MD   400 mg at 04/03/18 1035  . mirabegron ER (MYRBETRIQ) tablet 25 mg  25 mg Oral Daily Salary, Montell D, MD   25 mg at 04/03/18 1036  . mometasone-formoterol (DULERA) 100-5 MCG/ACT inhaler 2 puff  2 puff Inhalation BID Angelina Ok D, MD   2 puff at 04/02/18 2053  . mupirocin ointment (BACTROBAN) 2 % 1 application  1 application  Topical Daily PRN Salary, Montell D, MD      . nitroGLYCERIN (NITROSTAT) SL tablet 0.4 mg  0.4 mg Sublingual Q5 min PRN Salary, Montell D, MD      . ondansetron (ZOFRAN) injection 4 mg  4 mg Intravenous Q6H PRN Salary, Montell D, MD      . pantoprazole (PROTONIX) EC tablet 40 mg  40 mg Oral Daily Salary, Montell D, MD   40 mg at 04/03/18 1035  .  phenol (CHLORASEPTIC) mouth spray 1 spray  1 spray Mouth/Throat PRN Salary, Montell D, MD      . polyethylene glycol (MIRALAX / GLYCOLAX) packet 17 g  17 g Oral BID Tukov-Yual, Magdalene S, NP   17 g at 04/03/18 1037  . polyvinyl alcohol (LIQUIFILM TEARS) 1.4 % ophthalmic solution 1 drop  1 drop Both Eyes PRN Salary, Montell D, MD      . potassium chloride (KLOR-CON) packet 40 mEq  40 mEq Oral Once Salary, Montell D, MD      . risperiDONE (RISPERDAL) tablet 0.25 mg  0.25 mg Oral QHS Salary, Montell D, MD   0.25 mg at 04/02/18 0048  . sodium chloride flush (NS) 0.9 % injection 3 mL  3 mL Intravenous Q12H Salary, Montell D, MD   3 mL at 04/03/18 1037  . sodium chloride flush (NS) 0.9 % injection 3 mL  3 mL Intravenous PRN Salary, Montell D, MD      . sodium phosphate (FLEET) 7-19 GM/118ML enema 1 enema  1 enema Rectal Daily PRN Salary, Evelena AsaMontell D, MD   1 enema at 04/02/18 2053     Discharge Medications: Please see discharge summary for a list of discharge medications.  Relevant Imaging Results:  Relevant Lab Results:   Additional Information (SSN: [id number])  Jaime Boehlke, Darleen CrockerBailey M, LCSW

## 2018-04-04 ENCOUNTER — Emergency Department: Payer: Medicare Other

## 2018-04-04 ENCOUNTER — Inpatient Hospital Stay (HOSPITAL_COMMUNITY)
Admission: EM | Admit: 2018-04-04 | Discharge: 2018-04-12 | Disposition: E | Payer: Medicare Other | Source: Home / Self Care | Attending: Family Medicine | Admitting: Family Medicine

## 2018-04-04 ENCOUNTER — Other Ambulatory Visit: Payer: Self-pay

## 2018-04-04 ENCOUNTER — Inpatient Hospital Stay: Payer: Medicare Other

## 2018-04-04 DIAGNOSIS — R0602 Shortness of breath: Secondary | ICD-10-CM

## 2018-04-04 DIAGNOSIS — T402X5A Adverse effect of other opioids, initial encounter: Secondary | ICD-10-CM

## 2018-04-04 DIAGNOSIS — D649 Anemia, unspecified: Secondary | ICD-10-CM

## 2018-04-04 DIAGNOSIS — K5903 Drug induced constipation: Secondary | ICD-10-CM

## 2018-04-04 DIAGNOSIS — J9621 Acute and chronic respiratory failure with hypoxia: Secondary | ICD-10-CM

## 2018-04-04 DIAGNOSIS — R0600 Dyspnea, unspecified: Secondary | ICD-10-CM

## 2018-04-04 DIAGNOSIS — J9690 Respiratory failure, unspecified, unspecified whether with hypoxia or hypercapnia: Secondary | ICD-10-CM | POA: Diagnosis present

## 2018-04-04 DIAGNOSIS — J9600 Acute respiratory failure, unspecified whether with hypoxia or hypercapnia: Secondary | ICD-10-CM

## 2018-04-04 LAB — CBC WITH DIFFERENTIAL/PLATELET
Abs Immature Granulocytes: 0.22 10*3/uL — ABNORMAL HIGH (ref 0.00–0.07)
Basophils Absolute: 0.1 10*3/uL (ref 0.0–0.1)
Basophils Relative: 1 %
EOS ABS: 0 10*3/uL (ref 0.0–0.5)
Eosinophils Relative: 0 %
HCT: 26.8 % — ABNORMAL LOW (ref 36.0–46.0)
Hemoglobin: 7.9 g/dL — ABNORMAL LOW (ref 12.0–15.0)
Immature Granulocytes: 2 %
Lymphocytes Relative: 26 %
Lymphs Abs: 2.4 10*3/uL (ref 0.7–4.0)
MCH: 30.3 pg (ref 26.0–34.0)
MCHC: 29.5 g/dL — ABNORMAL LOW (ref 30.0–36.0)
MCV: 102.7 fL — ABNORMAL HIGH (ref 80.0–100.0)
Monocytes Absolute: 1.1 10*3/uL — ABNORMAL HIGH (ref 0.1–1.0)
Monocytes Relative: 12 %
Neutro Abs: 5.4 10*3/uL (ref 1.7–7.7)
Neutrophils Relative %: 59 %
Platelets: 204 10*3/uL (ref 150–400)
RBC: 2.61 MIL/uL — ABNORMAL LOW (ref 3.87–5.11)
RDW: 24.4 % — AB (ref 11.5–15.5)
WBC: 9.3 10*3/uL (ref 4.0–10.5)
nRBC: 1.1 % — ABNORMAL HIGH (ref 0.0–0.2)

## 2018-04-04 LAB — BASIC METABOLIC PANEL
Anion gap: 7 (ref 5–15)
BUN: 36 mg/dL — ABNORMAL HIGH (ref 8–23)
CO2: 37 mmol/L — ABNORMAL HIGH (ref 22–32)
CREATININE: 0.86 mg/dL (ref 0.44–1.00)
Calcium: 8.3 mg/dL — ABNORMAL LOW (ref 8.9–10.3)
Chloride: 95 mmol/L — ABNORMAL LOW (ref 98–111)
GFR calc Af Amer: >60 mL/min (ref >60)
GFR calc non Af Amer: >60 mL/min (ref >60)
Glucose, Bld: 90 mg/dL (ref 70–99)
Potassium: 3.5 mmol/L (ref 3.5–5.1)
Sodium: 139 mmol/L (ref 135–145)

## 2018-04-04 MED ORDER — POLYETHYLENE GLYCOL 3350 17 G PO PACK
17.0000 g | PACK | Freq: Every day | ORAL | Status: DC
  Administered 2018-04-05: 17 g via ORAL
  Filled 2018-04-04: qty 1

## 2018-04-04 MED ORDER — ALUM & MAG HYDROXIDE-SIMETH 200-200-20 MG/5ML PO SUSP
30.0000 mL | Freq: Once | ORAL | Status: AC
  Administered 2018-04-04: 30 mL via ORAL
  Filled 2018-04-04: qty 30

## 2018-04-04 MED ORDER — ENSURE ENLIVE PO LIQD: 237.0000 mL | Freq: Two times a day (BID) | ORAL | 0 refills | Status: AC

## 2018-04-04 MED ORDER — ENSURE ENLIVE PO LIQD: 237.0000 mL | Freq: Two times a day (BID) | ORAL | Status: DC

## 2018-04-04 MED ORDER — FERROUS FUMARATE 324 (106 FE) MG PO TABS
1.0000 | ORAL_TABLET | Freq: Every day | ORAL | Status: DC
  Filled 2018-04-04: qty 1

## 2018-04-04 MED ORDER — IPRATROPIUM-ALBUTEROL 0.5-2.5 (3) MG/3ML IN SOLN
3.0000 mL | Freq: Four times a day (QID) | RESPIRATORY_TRACT | Status: DC
  Administered 2018-04-05 (×2): 3 mL via RESPIRATORY_TRACT
  Filled 2018-04-04 (×2): qty 3

## 2018-04-04 MED ORDER — MOMETASONE FURO-FORMOTEROL FUM 100-5 MCG/ACT IN AERO
2.0000 | INHALATION_SPRAY | Freq: Two times a day (BID) | RESPIRATORY_TRACT | Status: DC
  Administered 2018-04-05 (×2): 2 via RESPIRATORY_TRACT
  Filled 2018-04-04: qty 8.8

## 2018-04-04 MED ORDER — CEFAZOLIN IV (FOR PTA / DISCHARGE USE ONLY): 2.0000 g | Freq: Three times a day (TID) | INTRAVENOUS | Status: DC

## 2018-04-04 MED ORDER — AMITRIPTYLINE HCL 50 MG PO TABS
50.0000 mg | ORAL_TABLET | Freq: Every day | ORAL | Status: DC
  Administered 2018-04-05: 50 mg via ORAL
  Filled 2018-04-04: qty 2
  Filled 2018-04-04: qty 1

## 2018-04-04 MED ORDER — GUAIFENESIN-DM 100-10 MG/5ML PO SYRP
5.0000 mL | ORAL_SOLUTION | ORAL | Status: DC | PRN
  Administered 2018-04-05: 5 mL via ORAL
  Filled 2018-04-04 (×2): qty 5

## 2018-04-04 MED ORDER — DILTIAZEM HCL ER COATED BEADS 240 MG PO CP24
240.0000 mg | ORAL_CAPSULE | Freq: Every day | ORAL | Status: DC
  Administered 2018-04-05: 240 mg via ORAL
  Filled 2018-04-04: qty 1

## 2018-04-04 MED ORDER — HYOSCYAMINE SULFATE 0.125 MG SL SUBL
0.2500 mg | SUBLINGUAL_TABLET | Freq: Once | SUBLINGUAL | Status: AC
  Administered 2018-04-04: 0.25 mg via SUBLINGUAL
  Filled 2018-04-04: qty 2

## 2018-04-04 MED ORDER — METHYLNALTREXONE BROMIDE 12 MG/0.6ML ~~LOC~~ SOLN
12.0000 mg | Freq: Once | SUBCUTANEOUS | Status: AC
  Administered 2018-04-04: 12 mg via SUBCUTANEOUS
  Filled 2018-04-04: qty 0.6

## 2018-04-04 MED ORDER — OCUVITE-LUTEIN PO CAPS: 1.0000 | ORAL_CAPSULE | Freq: Every day | ORAL | 0 refills | Status: AC

## 2018-04-04 MED ORDER — FUROSEMIDE 40 MG PO TABS: 40.0000 mg | ORAL_TABLET | Freq: Two times a day (BID) | ORAL | 0 refills | Status: AC

## 2018-04-04 MED ORDER — DICYCLOMINE HCL 10 MG/5ML PO SOLN
10.0000 mg | Freq: Once | ORAL | Status: DC
  Filled 2018-04-04: qty 5

## 2018-04-04 MED ORDER — LISINOPRIL 5 MG PO TABS
2.5000 mg | ORAL_TABLET | Freq: Every day | ORAL | Status: DC
  Administered 2018-04-05: 2.5 mg via ORAL
  Filled 2018-04-04: qty 1

## 2018-04-04 MED ORDER — POLYVINYL ALCOHOL 1.4 % OP SOLN
1.0000 [drp] | OPHTHALMIC | Status: DC | PRN
  Administered 2018-04-05: 1 [drp] via OPHTHALMIC
  Filled 2018-04-04: qty 15

## 2018-04-04 MED ORDER — MIRABEGRON ER 25 MG PO TB24
25.0000 mg | ORAL_TABLET | Freq: Every day | ORAL | Status: DC
  Filled 2018-04-04: qty 1

## 2018-04-04 MED ORDER — ASPIRIN 81 MG PO TBEC: 81.0000 mg | DELAYED_RELEASE_TABLET | Freq: Every day | ORAL | 0 refills | Status: AC

## 2018-04-04 MED ORDER — SODIUM CHLORIDE 0.9% FLUSH: 3.0000 mL | INTRAVENOUS | Status: DC | PRN

## 2018-04-04 MED ORDER — LISINOPRIL 2.5 MG PO TABS: 2.5000 mg | ORAL_TABLET | Freq: Every day | ORAL | 0 refills | Status: AC

## 2018-04-04 MED ORDER — ONDANSETRON HCL 4 MG PO TABS: 4.0000 mg | ORAL_TABLET | Freq: Three times a day (TID) | ORAL | Status: DC | PRN

## 2018-04-04 MED ORDER — PROMETHAZINE HCL 25 MG PO TABS
12.5000 mg | ORAL_TABLET | Freq: Four times a day (QID) | ORAL | Status: DC | PRN
  Administered 2018-04-05: 12.5 mg via ORAL
  Filled 2018-04-04 (×2): qty 1

## 2018-04-04 MED ORDER — AMITRIPTYLINE HCL 50 MG PO TABS: 50.0000 mg | ORAL_TABLET | Freq: Every day | ORAL | 0 refills | Status: AC

## 2018-04-04 MED ORDER — IBUPROFEN 400 MG PO TABS: 600.0000 mg | ORAL_TABLET | Freq: Four times a day (QID) | ORAL | Status: DC | PRN

## 2018-04-04 MED ORDER — OCUVITE-LUTEIN PO CAPS
1.0000 | ORAL_CAPSULE | Freq: Every day | ORAL | Status: DC
  Filled 2018-04-04: qty 1

## 2018-04-04 MED ORDER — MAGNESIUM OXIDE 400 (241.3 MG) MG PO TABS
400.0000 mg | ORAL_TABLET | Freq: Every day | ORAL | Status: DC
  Administered 2018-04-05: 400 mg via ORAL
  Filled 2018-04-04: qty 1

## 2018-04-04 MED ORDER — LACTULOSE 10 GM/15ML PO SOLN
30.0000 g | Freq: Every day | ORAL | Status: DC
  Administered 2018-04-05: 30 g via ORAL
  Filled 2018-04-04: qty 60

## 2018-04-04 MED ORDER — OXYCODONE-ACETAMINOPHEN 2.5-325 MG PO TABS: 1.0000 | ORAL_TABLET | Freq: Two times a day (BID) | ORAL | Status: DC | PRN

## 2018-04-04 MED ORDER — ACETAMINOPHEN 325 MG PO TABS: 650.0000 mg | ORAL_TABLET | Freq: Four times a day (QID) | ORAL | Status: DC | PRN

## 2018-04-04 MED ORDER — FLUTICASONE PROPIONATE 50 MCG/ACT NA SUSP
1.0000 | Freq: Every day | NASAL | Status: DC
  Administered 2018-04-05: 1 via NASAL
  Filled 2018-04-04: qty 16

## 2018-04-04 MED ORDER — GUAIFENESIN-DM 100-10 MG/5ML PO SYRP: 5.0000 mL | ORAL_SOLUTION | ORAL | 0 refills | Status: AC | PRN

## 2018-04-04 MED ORDER — DICLOFENAC SODIUM 1 % TD GEL
2.0000 g | Freq: Four times a day (QID) | TRANSDERMAL | Status: DC
  Administered 2018-04-05: 2 g via TOPICAL
  Filled 2018-04-04: qty 100

## 2018-04-04 MED ORDER — PANTOPRAZOLE SODIUM 40 MG PO TBEC
40.0000 mg | DELAYED_RELEASE_TABLET | Freq: Every day | ORAL | Status: DC
  Administered 2018-04-05: 40 mg via ORAL
  Filled 2018-04-04: qty 1

## 2018-04-04 MED ORDER — VITAMIN D3 25 MCG (1000 UNIT) PO TABS
1000.0000 [IU] | ORAL_TABLET | Freq: Every day | ORAL | Status: DC
  Administered 2018-04-05: 1000 [IU] via ORAL
  Filled 2018-04-04 (×2): qty 1

## 2018-04-04 MED ORDER — SODIUM CHLORIDE 0.9 % IV SOLN: 250.0000 mL | INTRAVENOUS | Status: DC | PRN

## 2018-04-04 MED ORDER — FLEET ENEMA 7-19 GM/118ML RE ENEM
1.0000 | ENEMA | Freq: Once | RECTAL | Status: AC
  Administered 2018-04-04: 1 via RECTAL

## 2018-04-04 MED ORDER — FLEET ENEMA 7-19 GM/118ML RE ENEM: 1.0000 | ENEMA | Freq: Once | RECTAL | Status: DC | PRN

## 2018-04-04 MED ORDER — LEVOTHYROXINE SODIUM 50 MCG PO TABS: 25.0000 ug | ORAL_TABLET | Freq: Every day | ORAL | Status: DC

## 2018-04-04 MED ORDER — LIDOCAINE VISCOUS HCL 2 % MT SOLN
15.0000 mL | Freq: Once | OROMUCOSAL | Status: AC
  Administered 2018-04-04: 15 mL via ORAL
  Filled 2018-04-04: qty 15

## 2018-04-04 MED ORDER — SODIUM CHLORIDE 0.9% FLUSH
3.0000 mL | Freq: Two times a day (BID) | INTRAVENOUS | Status: DC
  Administered 2018-04-05 (×2): 3 mL via INTRAVENOUS

## 2018-04-04 MED ORDER — CEFAZOLIN SODIUM-DEXTROSE 2-4 GM/100ML-% IV SOLN
2.0000 g | Freq: Three times a day (TID) | INTRAVENOUS | Status: DC
  Administered 2018-04-05 – 2018-04-06 (×4): 2 g via INTRAVENOUS
  Filled 2018-04-04 (×8): qty 100

## 2018-04-04 MED ORDER — LACTULOSE 10 GM/15ML PO SOLN: 20.0000 g | Freq: Every day | ORAL | 0 refills | Status: AC

## 2018-04-04 MED ORDER — OXYCODONE-ACETAMINOPHEN 2.5-325 MG PO TABS: 1.0000 | ORAL_TABLET | Freq: Two times a day (BID) | ORAL | 0 refills | Status: AC | PRN

## 2018-04-04 MED ORDER — SENNOSIDES-DOCUSATE SODIUM 8.6-50 MG PO TABS
3.0000 | ORAL_TABLET | Freq: Every day | ORAL | Status: DC
  Administered 2018-04-05: 3 via ORAL
  Filled 2018-04-04: qty 3

## 2018-04-04 MED ORDER — OXYCODONE-ACETAMINOPHEN 5-325 MG PO TABS: 0.5000 | ORAL_TABLET | Freq: Two times a day (BID) | ORAL | Status: DC | PRN

## 2018-04-04 MED ORDER — RISPERIDONE 0.25 MG PO TABS
0.2500 mg | ORAL_TABLET | Freq: Every day | ORAL | Status: DC
  Administered 2018-04-05: 0.25 mg via ORAL
  Filled 2018-04-04 (×2): qty 1

## 2018-04-04 MED ORDER — FUROSEMIDE 20 MG PO TABS
40.0000 mg | ORAL_TABLET | Freq: Two times a day (BID) | ORAL | Status: DC
  Administered 2018-04-05: 40 mg via ORAL
  Filled 2018-04-04: qty 2

## 2018-04-04 MED ORDER — LEVOTHYROXINE SODIUM 25 MCG PO TABS: 25.0000 ug | ORAL_TABLET | Freq: Every day | ORAL | 0 refills | Status: AC

## 2018-04-04 NOTE — Progress Notes (Signed)
Palliative:  Mrs. Myer HaffYarbrough is resting quietly in bed.  She is blind and hard of hearing but is able to communicate effectively.  She appears quite weak and frail today.  Present today at bedside is husband of 8 years, Kristen CardinalBill Vanderveld.   We talked at length about Mrs. Mcdill acute and chronic health problems.  We talked about her breathing issues, her bowel regimen, her pain and need for name brand Percocet, osteomyelitis and bacteremia.  We talked about prognosis with permission, and we discussed what is important to her as far as quality of life.  Annette StableBill tells a detailed story about Mrs. Spohr's life.  He shares that she was accidentally blinded at age 89.  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  Annette StableBill tells me she is a very independent woman, and she really would like to be able to regain some physical function/ability to care for herself.  I share my concern that after 5 weeks in rehab, we have not seen improvement.  Bill agrees.   We talk about the concepts of do not rehospitalize, and how to care for her if/when this is chosen. Bill talks at Morgan Stanleylength about his love fer her and his struggles with her behaviors after a fall with head injury and psychological changes afterwards.  He talks about her time on life support and I verify that we would put her on life support again.    60 minutes, extended time [name] Aedyn Kempfer, NP Palliative Medicine Team [phone] Greater than 50% of this time was spent counseling and coordination care related to the above assessment and plan.

## 2018-04-04 NOTE — ED Notes (Signed)
Pt to ct. Family at the bedside.

## 2018-04-04 NOTE — Progress Notes (Signed)
Called and gave report to facility nurse at this time. All questions answered. Raynald BlendSteven M Deondrick Searls Called EMS for non-emergent patient transportation to the receiving facility at this time. Awaiting arrival. Jari FavreSteven M [facility]

## 2018-04-04 NOTE — ED Notes (Signed)
Pt resting quietly.

## 2018-04-04 NOTE — Progress Notes (Signed)
PT Cancellation Note  Patient Details Name: Jaime [name] MRN: [id number] DOB: Jun 23, 1927   Cancelled Treatment:    Reason Eval/Treat Not Completed: Patient at procedure or test/unavailable.  Will try again at another time.   Ivar DrapeRuth E Rayleigh Gillyard 03/13/2018, 12:51 PM   Samul Dadauth Omarr Hann, PT MS Acute Rehab Dept. Number: [facility] [id number] and [facility] [phone]
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
## 2018-04-04 NOTE — Progress Notes (Signed)
Family Meeting Note  Advance Directive:yes  Today a meeting took place with the Patient.  Patient is able to participate  The following clinical team members were present during this meeting:MD  The following were discussed:Patient's diagnosis: Respiratory failure, congestive heart failure, Patient's progosis: Unable to determine and Goals for treatment: DNR  Additional follow-up to be provided: prn  Time spent during discussion:20 minutes  Jaime SolMontell D Keiton Cosma, MD

## 2018-04-04 NOTE — Discharge Instructions (Signed)
Acute Respiratory Distress Syndrome, Adult ° °Acute respiratory distress syndrome is a life-threatening condition in which fluid collects in the lungs. This prevents the lungs from filling with air and passing oxygen into the blood. This can cause the lungs and other vital organs to fail. The condition usually develops following an infection, illness, surgery, or injury. °What are the causes? °This condition may be caused by: °· An infection, such as sepsis or pneumonia. °· A serious injury to the head or chest. °· Severe bleeding from an injury. °· A major surgery. °· Breathing in harmful chemicals or smoke. °· Blood transfusions. °· A blood clot in the lungs. °· Breathing in vomit (aspiration). °· Near-drowning. °· Inflammation of the pancreas (pancreatitis). °· A drug overdose. °What are the signs or symptoms? °Sudden shortness of breath and rapid breathing are the main symptoms of this condition. Other symptoms may include: °· A fast or irregular heartbeat. °· Skin, lips, or fingernails that look blue (cyanosis). °· Confusion. °· Tiredness or loss of energy. °· Chest pain, particularly while taking a breath. °· Coughing. °· Restlessness or anxiety. °· Fever. This is usually present if there is an underlying infection, such as pneumonia. °How is this diagnosed? °This condition is diagnosed based on: °· Your symptoms. °· Medical history. °· A physical exam. During the exam, your health care provider will listen to your heart and check for crackling or wheezing sounds in your lungs. °You may also have other tests to confirm the diagnosis and measure how well your lungs are working. These may include: °· Measuring the amount of oxygen in your blood. Your health care provider will use two methods to do this procedure: °? A small device (pulse oximeter) that is placed on your finger, earlobe, or toe. °? An arterial blood gas test. A sample of blood is taken from an artery and tested for oxygen levels. °· Blood  tests. °· Chest X-rays or CT scans to look for fluid in the lungs. °· Taking a sample of your sputum to test for infection. °· Heart test, such as an echocardiogram or electrocardiogram. This is done to rule out any heart problems (such as heart failure) that may be causing your symptoms. °· Bronchoscopy. During this test, a thin, flexible tube with a light is passed into the mouth or nose, down the windpipe, and into the lungs. °How is this treated? °Treatment depends on the cause of your condition. The goal is to support you while your lungs heal and the underlying cause is treated. Treatment may include: °· Oxygen therapy. This may be done through: °? A tube in your nose or a face mask. °? A ventilator. This device helps move air into and out of your lungs through a breathing tube that is inserted into your mouth or nose. °· Continuous positive airway pressure (CPAP). This treatment uses mild air pressure to keep the airways open. A mask or other device will be placed over your nose or mouth. °· Tracheostomy. During this procedure, a small cut is made in your neck to create an opening to your windpipe. A breathing tube is placed directly into your windpipe. The breathing tube is connected to a ventilator. This is done if you have problems with your airway or if you need a ventilator for a long period of time. °· Positioning you to lie on your stomach (prone position). °· Medicines, such as: °? Sedatives to help you relax. °? Blood pressure medicines. °? Antibiotics to treat infection. °? Blood   thinners to prevent blood clots. °? Diuretics to help prevent excess fluid. °· Fluids and nutrients given through an IV tube. °· Wearing compression stockings on your legs to prevent blood clots. °· Extra corporeal membrane oxygenation (ECMO). This treatment takes blood outside your body, adds oxygen, and removes carbon dioxide. The blood is then returned to your body. This treatment is only used in severe cases. °Follow  these instructions at home: °· Take over-the-counter and prescription medicines only as told by your health care provider. °· Do not use any products that contain nicotine or tobacco, such as cigarettes and e-cigarettes. If you need help quitting, ask your health care provider. °· Limit alcohol intake to no more than 1 drink per day for nonpregnant women and 2 drinks per day for men. One drink equals 12 oz of beer, 5 oz of wine, or 1½ oz of hard liquor. °· Ask friends and family to help you if daily activities make you tired. °· Attend any pulmonary rehabilitation as told by your health care provider. This may include: °? Education about your condition. °? Exercises. °? Breathing training. °? Counseling. °? Learning techniques to conserve energy. °? Nutrition counseling. °· Keep all follow-up visits as told by your health care provider. This is important. °Contact a health care provider if: °· You become short of breath during activity or while resting. °· You develop a cough that does not go away. °· You have a fever. °· Your symptoms do not get better or they get worse. °· You become anxious or depressed. °Get help right away if: °· You have sudden shortness of breath. °· You develop sudden chest pain that does not go away. °· You develop a rapid heart rate. °· You develop swelling or pain in one of your legs. °· You cough up blood. °· You have trouble breathing. °· Your skin, lips, or fingernails turn blue. °These symptoms may represent a serious problem that is an emergency. Do not wait to see if the symptoms will go away. Get medical help right away. Call your local emergency services (911 in the [place] Do not drive yourself to the hospital. °Summary °· Acute respiratory distress syndrome is a life-threatening condition in which fluid collects in the lungs, which leads the lungs and other vital organs to fail. °· This condition usually develops following an infection, illness, surgery, or injury. °· Sudden  shortness of breath and rapid breathing are the main symptoms of acute respiratory distress syndrome. °· Treatment may include oxygen therapy, continuous positive airway pressure (CPAP), tracheostomy, lying on your stomach (prone position), medicines, fluids and nutrients given through an IV tube, compression stockings, and extra corporeal membrane oxygenation (ECMO). °This information is not intended to replace advice given to you by your health care provider. Make sure you discuss any questions you have with your health care provider. °Document Released: 03/29/2005 Document Revised: 03/15/2016 Document Reviewed: 03/15/2016 °Elsevier Interactive Patient Education © 2019 [employer] ° °

## 2018-04-04 NOTE — ED Triage Notes (Signed)
Pt was being discharged from room 254, transported by EMS to go back to her facility. Pt is currently on 5L of O2 but is unable to maintain a sat above 88%. Per EMS floor d/c pt and EMS were concerned that pt was unstable. Pt sats checked in ED and concerns were confirmed that pt is not at her baseline and unstable to be discharged.
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
## 2018-04-04 NOTE — Clinical Social Work Note (Addendum)
CSW contacted [facility], they have started insurance authorization yesterday, still waiting for auth.  CSW to continue to follow and facilitate discharge planning.  10:45am  CSW received phone call that patient has been approved for SNF placement.  [facility].  Patient and family agreeable to plans will transport via ems RN to call report [phone].  Ervin KnackEric R. Carsynn Bethune, MSW, Theresia MajorsLCSWA [phone]  04/02/2018 10:19 AM
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
## 2018-04-05 ENCOUNTER — Inpatient Hospital Stay (HOSPITAL_COMMUNITY)
Admit: 2018-04-05 | Discharge: 2018-04-05 | Disposition: A | Discharge status: Home / Self Care | Payer: Medicare Other | Attending: Pulmonary Disease | Admitting: Pulmonary Disease

## 2018-04-05 ENCOUNTER — Inpatient Hospital Stay: Payer: Medicare Other

## 2018-04-05 DIAGNOSIS — J90 Pleural effusion, not elsewhere classified: Secondary | ICD-10-CM

## 2018-04-05 DIAGNOSIS — R0603 Acute respiratory distress: Secondary | ICD-10-CM

## 2018-04-05 DIAGNOSIS — R54 Age-related physical debility: Secondary | ICD-10-CM

## 2018-04-05 DIAGNOSIS — I361 Nonrheumatic tricuspid (valve) insufficiency: Secondary | ICD-10-CM

## 2018-04-05 DIAGNOSIS — I340 Nonrheumatic mitral (valve) insufficiency: Secondary | ICD-10-CM

## 2018-04-05 LAB — BPAM RBC
BLOOD PRODUCT EXPIRATION DATE: 202001132359
Blood Product Expiration Date: 202001112359
Blood Product Expiration Date: 202001132359
ISSUE DATE / TIME: 201912221625
Unit Type and Rh: 600
Unit Type and Rh: 600
Unit Type and Rh: 600

## 2018-04-05 LAB — CBC WITH DIFFERENTIAL/PLATELET
ABS IMMATURE GRANULOCYTES: 0.22 10*3/uL — AB (ref 0.00–0.07)
Basophils Absolute: 0.1 10*3/uL (ref 0.0–0.1)
Basophils Relative: 1 %
Eosinophils Absolute: 0.1 10*3/uL (ref 0.0–0.5)
Eosinophils Relative: 1 %
HCT: 27 % — ABNORMAL LOW (ref 36.0–46.0)
Hemoglobin: 8 g/dL — ABNORMAL LOW (ref 12.0–15.0)
Immature Granulocytes: 2 %
Lymphocytes Relative: 27 %
Lymphs Abs: 2.4 10*3/uL (ref 0.7–4.0)
MCH: 30.1 pg (ref 26.0–34.0)
MCHC: 29.6 g/dL — ABNORMAL LOW (ref 30.0–36.0)
MCV: 101.5 fL — ABNORMAL HIGH (ref 80.0–100.0)
MONO ABS: 1 10*3/uL (ref 0.1–1.0)
Monocytes Relative: 11 %
Neutro Abs: 5.3 10*3/uL (ref 1.7–7.7)
Neutrophils Relative %: 58 %
Platelets: 197 10*3/uL (ref 150–400)
RBC: 2.66 MIL/uL — ABNORMAL LOW (ref 3.87–5.11)
RDW: 24.5 % — ABNORMAL HIGH (ref 11.5–15.5)
Smear Review: NORMAL
WBC: 9.1 10*3/uL (ref 4.0–10.5)
nRBC: 1.2 % — ABNORMAL HIGH (ref 0.0–0.2)

## 2018-04-05 LAB — APTT: aPTT: 38 seconds — ABNORMAL HIGH (ref 24–36)

## 2018-04-05 LAB — TYPE AND SCREEN
ABO/RH(D): A NEG
Antibody Screen: NEGATIVE
Donor AG Type: NEGATIVE
Unit division: 0
Unit division: 0
Unit division: 0

## 2018-04-05 LAB — MRSA PCR SCREENING: MRSA BY PCR: NEGATIVE

## 2018-04-05 LAB — BLOOD GAS, ARTERIAL
Acid-Base Excess: 16.5 mmol/L — ABNORMAL HIGH (ref 0.0–2.0)
Bicarbonate: 43.8 mmol/L — ABNORMAL HIGH (ref 20.0–28.0)
Delivery systems: POSITIVE
Expiratory PAP: 5
FIO2: 100
Inspiratory PAP: 16
Mechanical Rate: 10
O2 Saturation: 99.6 %
Patient temperature: 37
pCO2 arterial: 63 mmHg — ABNORMAL HIGH (ref 32.0–48.0)
pH, Arterial: 7.45 (ref 7.350–7.450)
pO2, Arterial: 167 mmHg — ABNORMAL HIGH (ref 83.0–108.0)

## 2018-04-05 LAB — COMPREHENSIVE METABOLIC PANEL
ALBUMIN: 2.6 g/dL — AB (ref 3.5–5.0)
ALT: <5 U/L (ref 0–44)
AST: 14 U/L — ABNORMAL LOW (ref 15–41)
Alkaline Phosphatase: 83 U/L (ref 38–126)
Anion gap: 9 (ref 5–15)
BUN: 37 mg/dL — AB (ref 8–23)
CO2: 39 mmol/L — ABNORMAL HIGH (ref 22–32)
Calcium: 8.5 mg/dL — ABNORMAL LOW (ref 8.9–10.3)
Chloride: 89 mmol/L — ABNORMAL LOW (ref 98–111)
Creatinine, Ser: 0.76 mg/dL (ref 0.44–1.00)
GFR calc Af Amer: >60 mL/min (ref >60)
GFR calc non Af Amer: >60 mL/min (ref >60)
GLUCOSE: 88 mg/dL (ref 70–99)
Potassium: 3.4 mmol/L — ABNORMAL LOW (ref 3.5–5.1)
Sodium: 137 mmol/L (ref 135–145)
Total Bilirubin: 0.7 mg/dL (ref 0.3–1.2)
Total Protein: 6 g/dL — ABNORMAL LOW (ref 6.5–8.1)

## 2018-04-05 LAB — VITAMIN B12: Vitamin B-12: 997 pg/mL — ABNORMAL HIGH (ref 180–914)

## 2018-04-05 LAB — TROPONIN I: Troponin I: <0.03 ng/mL (ref <0.03)

## 2018-04-05 LAB — ECHOCARDIOGRAM COMPLETE
Height: 65 in
WEIGHTICAEL: 3248 [oz_av]

## 2018-04-05 LAB — PROTIME-INR
INR: 1.03
PROTHROMBIN TIME: 13.4 s (ref 11.4–15.2)

## 2018-04-05 LAB — PREALBUMIN: Prealbumin: 13.5 mg/dL — ABNORMAL LOW (ref 18–38)

## 2018-04-05 LAB — BRAIN NATRIURETIC PEPTIDE: B Natriuretic Peptide: 146 pg/mL — ABNORMAL HIGH (ref 0.0–100.0)

## 2018-04-05 LAB — FOLATE: Folate: 5 ng/mL — ABNORMAL LOW (ref >5.9)

## 2018-04-05 LAB — MAGNESIUM: Magnesium: 2.4 mg/dL (ref 1.7–2.4)

## 2018-04-05 LAB — PROCALCITONIN: Procalcitonin: <0.1 ng/mL

## 2018-04-05 LAB — PHOSPHORUS: Phosphorus: 3.5 mg/dL (ref 2.5–4.6)

## 2018-04-05 LAB — LACTIC ACID, PLASMA: Lactic Acid, Venous: 0.6 mmol/L (ref 0.5–1.9)

## 2018-04-05 LAB — GLUCOSE, CAPILLARY: Glucose-Capillary: 97 mg/dL (ref 70–99)

## 2018-04-05 MED ORDER — FUROSEMIDE 10 MG/ML IJ SOLN
40.0000 mg | Freq: Two times a day (BID) | INTRAMUSCULAR | Status: DC
  Administered 2018-04-05 – 2018-04-06 (×3): 40 mg via INTRAVENOUS
  Filled 2018-04-05 (×3): qty 4

## 2018-04-05 MED ORDER — MORPHINE SULFATE (PF) 2 MG/ML IV SOLN
2.0000 mg | INTRAVENOUS | Status: DC | PRN
  Administered 2018-04-05: 3 mg via INTRAVENOUS
  Administered 2018-04-05: 2 mg via INTRAVENOUS
  Administered 2018-04-05: 4 mg via INTRAVENOUS
  Administered 2018-04-05: 2 mg via INTRAVENOUS
  Administered 2018-04-05 – 2018-04-06 (×5): 4 mg via INTRAVENOUS
  Filled 2018-04-05 (×3): qty 2
  Filled 2018-04-05: qty 1
  Filled 2018-04-05 (×3): qty 2
  Filled 2018-04-05: qty 1
  Filled 2018-04-05: qty 2

## 2018-04-05 MED ORDER — IPRATROPIUM-ALBUTEROL 0.5-2.5 (3) MG/3ML IN SOLN: 3.0000 mL | RESPIRATORY_TRACT | Status: DC | PRN

## 2018-04-05 MED ORDER — DEXTROSE 5 % IV SOLN
500.0000 mg | Freq: Once | INTRAVENOUS | Status: AC
  Administered 2018-04-05: 500 mg via INTRAVENOUS
  Filled 2018-04-05: qty 500

## 2018-04-05 MED ORDER — SODIUM CHLORIDE 0.9 % IV SOLN
1.0000 mg | Freq: Once | INTRAVENOUS | Status: DC
  Filled 2018-04-05: qty 0.2

## 2018-04-05 MED ORDER — BUMETANIDE 0.25 MG/ML IJ SOLN
2.0000 mg | Freq: Once | INTRAMUSCULAR | Status: AC
  Administered 2018-04-05: 2 mg via INTRAVENOUS
  Filled 2018-04-05: qty 8

## 2018-04-05 MED ORDER — ALBUMIN HUMAN 25 % IV SOLN
25.0000 g | Freq: Once | INTRAVENOUS | Status: AC
  Administered 2018-04-05: 25 g via INTRAVENOUS
  Filled 2018-04-05: qty 100

## 2018-04-05 MED ORDER — FOLIC ACID 1 MG PO TABS: 1.0000 mg | ORAL_TABLET | Freq: Every day | ORAL | Status: DC

## 2018-04-05 MED ORDER — POTASSIUM CHLORIDE 10 MEQ/100ML IV SOLN
10.0000 meq | INTRAVENOUS | Status: AC
  Administered 2018-04-05 (×3): 10 meq via INTRAVENOUS
  Filled 2018-04-05 (×3): qty 100

## 2018-04-05 NOTE — Progress Notes (Signed)
Pt transferred to ICU  Without any incident Bipap plugged into red outlet , report Given to Rt in charge of the pt
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
## 2018-04-05 NOTE — Progress Notes (Signed)
Family Meeting Note  Advance Directive:yes  Today a meeting took place with the Patient and spouse.  Patient is unable to participate due ZO:[id number]:Lacked capacity ams   The following clinical team members were present during this meeting:MD  The following were discussed:Patient's diagnosis: terminal condition, Patient's progosis: Unable to determine and Goals for treatment: Palliative Care  Additional follow-up to be provided: prn  Time spent during discussion:20 minutes  Bertrum SolMontell D Salary, MD

## 2018-04-05 NOTE — Progress Notes (Addendum)
Contacted overnight for hypoxia, acute on chronic, SpO2 70s-80s range on 4L. Started on NRB, SpO2 improved to 100%. Pt is able to state that she is feeling generally poorly.  Admission CT chest demonstrated large bilateral pleural effusions. She had no effusions or active pulmonary disease as of 02/17/2017 CT chest, but appears to have developed small effusions as of early 02/2018. 02/18/2018 CXR demonstrated pulmonary vascular congestion and a small left pleural effusion. 02/22/2018 CXR demonstrated cardiomegaly, vascular congestion and small bilateral effusions. CT A/P performed on 03/01/2018 notes small bilateral effusions in the lower chest. I believe the imaging findings seen on CT chest on present admission represent progression of process described above. Each of these imaging studies also makes note of atelectasis.  Pt w/ complex Hx, numerous medical problems (including but not limited to oxygen-dependent COPD), who appears to be decompensating since early 02/2018. 03/03/2018 Echo demonstrates EF 60-65%, mild MR, mod TR, mod PHTN (PASP 59). Pt is obese, but I suspect she is paradoxically malnourished, as tends to occur in cases of severe/end-stage chronic lung disease. Albumin has not been checked in some time, but was 2.1 on 03/05/2018. Total Protein and Phosphorus were also low at that time. 03/01/2018 CT A/P does not make mention of cirrhosis or acute liver disease. 03/05/2018 AST/ALT/APhos were low/normal. 02/28/2018 U/A (-) protein. Pleural effusions are more likely due to a combination of intrinsic lung disease (COPD, PHTN), central obesity (w/ impaired diaphragmatic excursion), large hiatal hernia, spinal compression fractures/DJD/OA/CBP, chronic hypoventilation, chronic atelectasis, anemia, aforementioned (suspected) malnourishment and deconditioning. Chronic heart disease (diastolic failure, valvulopathy) likely have a role to play in the pathogenesis of her present course, but I suspect the  present situation is much less likely due to acute heart failure. Despite exhibiting edema, BUN/Cr ratio 36/0.86 = ~41.86, suggesting intravascular volume depletion. CT chest performed on admission notes that aerated portions of lung are non-edematous. As such, I do not expect that the hypoxia and pleural effusions will be amenable to improvement through diuresis.   Case d/w intensivist on call. Repeat labwork pending. Ordered for BiPAP. Though not likely to help (as discussed above), I will institute a trial of diuretics therapy. Albumin 25g IV x1 + Diuril 500mg  IV x1 (@0330AM ), followed by Bumex 2mg  IV x1 (30min later, @0400AM ). Ultimately, I expect the pt will need bilateral therapeutic thoracentesis to achieve symptomatic improvement. Beyond this, she will need consultation from a Nutritionist, and may need TPN/PPN.  That said however, given the circumstances (advanced age with late-stage/end-organ condition), I suspect this pt may well be at the end of life.  -P. Nikcole Eischeid (Nocturnist/Hospitalist)

## 2018-04-05 NOTE — Progress Notes (Signed)
Patient saturations 74% on 4L Hi-flo; states "I feel miserable". Patient placed on non-re breather, saturations 100%. Respirations 30/min. Respiratory therapist at bedside. MD Prasanna made aware, MD to place orders.  Update: Patient to transfer to ICU 18. Report given to [place], Charity fundraiserN. All belongings packed at bedside and sent with patient. Patient transported with nursing staff and respiratory therapist. Husband, Felicity PellegriniWilliam Vandervelde, notified. All concerns addressed.
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
## 2018-04-05 NOTE — Progress Notes (Signed)
eLink Physician-Brief Progress Note Patient Name: Jaime Spittleatricia S Brannum DOB: 17-Aug-1927 MRN: [id number]   Date of Service  04/05/2018  HPI/Events of Note    eICU Interventions  84 yr old female accepted from floor to ICU for 1). AHRF from acute on chronic systolic CHF ( BNP 146, troponin low), bilateral large pleural effusion and hiatal hernia on CT chest now on BiPAP. Tolerating well on BiPAP on Camera assessment.  Stable VS. sats good. Discussed with bed side RN. Replace low potassium. Going to get albumin with diuril/bumex. Consider  TPN/PPN for low albumin/PEM related effusion.  Consider pleural tapping: diagnostic and therapeutic in AM.  Asp precautions. 2). T12 diskitis/Osteopmyelitis on ancef for MSSA bacteremia. Till 4 th January via PICC line.  3) Chronic anemia.  On prophylaxis. SCD.     Intervention Category Major Interventions: Respiratory failure - evaluation and management Minor Interventions: Electrolytes abnormality - evaluation and management Evaluation Type: New Patient Evaluation  [name] 04/05/2018, 3:45 AM

## 2018-04-05 NOTE — Progress Notes (Signed)
Patient is refusing to place Bipap on, patient forced bipap off, MD Simonds and High flow ordered.

## 2018-04-06 DIAGNOSIS — Z515 Encounter for palliative care: Secondary | ICD-10-CM

## 2018-04-06 DIAGNOSIS — Z7189 Other specified counseling: Secondary | ICD-10-CM

## 2018-04-06 DIAGNOSIS — J9621 Acute and chronic respiratory failure with hypoxia: Secondary | ICD-10-CM

## 2018-04-06 MED ORDER — POLYVINYL ALCOHOL 1.4 % OP SOLN
2.0000 [drp] | OPHTHALMIC | Status: DC | PRN
  Filled 2018-04-06: qty 15

## 2018-04-06 MED ORDER — LORAZEPAM 2 MG/ML IJ SOLN: 0.5000 mg | INTRAMUSCULAR | Status: DC | PRN

## 2018-04-06 NOTE — Progress Notes (Signed)
Pt developing worsening acute on chronic hypoxic respiratory failure requiring prn morphine frequently throughout the night and this morning secondary to air hunger.  Pt agitated at times attempting to remove HFNC.  I spoke with pts husband via telephone to inform him pt has shown signs of worsening respiratory failure throughout the night and recommended if possible he should come back to the hospital.  I also discussed goals of treatment and explained comfort care only.  However, he stated he would be unable to come back to the hospital at this time and would like to continue with current treatment plan.  Will continue to monitor and assess pt.  Sonda Rumbleana Sherese Heyward, AGNP  Pulmonary/Critical Care Pager [phone] (please enter 7 digits) PCCM Consult Pager [phone] (please enter 7 digits)

## 2018-04-06 NOTE — Progress Notes (Signed)
Moribund and still on HF [place].  At this point, we are transferring to complete comfort care.  Palliative care service involved and their assistance is appreciated.  I have placed order for transfer to hospice floor.  I updated patient's husband who is understanding and accepting of this plan.  After transfer, PCCM will sign off. Please call if we can be of further assistance   Billy Fischeravid Simonds, MD PCCM service Mobile [phone] Pager [phone] 04/06/2018 1:07 PM
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
## 2018-04-06 NOTE — Progress Notes (Signed)
Palliative:   [name] is resting quietly in bed.  She does not open her eyes when I call her name or move her arm.  She appears relatively comfortable, but acutely ill. Present today at bedside his husband of 8 years, Jaime [name].  Annette StableBill states that he realizes that [name] has been hospitalized for quite some time, "she is not getting better".  We talk about comfort measures, making sure that she has medications for pain and anxiety, breathlessness. We talked about prolonging the dying process and thereby prolonging suffering. Conference with critical care doctor about goals of care/comfort care.  40 minutes Jaime Carmelasha Dove, NP Palliative Medicine Team [phone] Greater than 50% of this time was spent counseling and coordination care related to the above assessment and plan.

## 2018-04-07 LAB — CALCIUM, IONIZED: Calcium, Ionized, Serum: 5 mg/dL (ref 4.5–5.6)

## 2018-04-11 ENCOUNTER — Inpatient Hospital Stay: Payer: Medicare Other | Admitting: Infectious Diseases
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
## 2018-04-12 NOTE — Progress Notes (Signed)
   03/12/2018 0550  Clinical Encounter Type  Visited With Family  Visit Type Initial;Spiritual support;Death  Referral From Nurse  Consult/Referral To Chaplain  Spiritual Encounters  Spiritual Needs Prayer;Emotional;Grief support   CH received a page from the RN that the patient has died and her daughter is present and understandably upset. I offered pastoral care and emotional support. I prayed for the patient's family and offered my continued support.

## 2018-04-12 NOTE — Plan of Care (Signed)

## 2018-04-12 DEATH — deceased

## 2018-04-13 ENCOUNTER — Ambulatory Visit: Payer: Self-pay | Admitting: Physician Assistant

## 2018-04-17 ENCOUNTER — Ambulatory Visit: Payer: Medicare Other | Admitting: Physician Assistant

## 2018-04-19 ENCOUNTER — Ambulatory Visit: Payer: Medicare Other | Admitting: Family
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
## 2018-11-09 ENCOUNTER — Ambulatory Visit: Payer: Self-pay
